# Patient Record
Sex: Female | Born: 1965 | Race: Black or African American | Hispanic: No | Marital: Single | Smoking: Never smoker
Health system: Southern US, Community
[De-identification: ages and names within clinical notes are randomized; demographics above are authoritative.]

## PROBLEM LIST (undated history)

## (undated) DIAGNOSIS — F419 Anxiety disorder, unspecified: Secondary | ICD-10-CM

## (undated) DIAGNOSIS — G709 Myoneural disorder, unspecified: Secondary | ICD-10-CM

## (undated) DIAGNOSIS — L4050 Arthropathic psoriasis, unspecified: Secondary | ICD-10-CM

## (undated) DIAGNOSIS — G5600 Carpal tunnel syndrome, unspecified upper limb: Secondary | ICD-10-CM

## (undated) DIAGNOSIS — M797 Fibromyalgia: Secondary | ICD-10-CM

## (undated) DIAGNOSIS — K589 Irritable bowel syndrome without diarrhea: Secondary | ICD-10-CM

## (undated) DIAGNOSIS — Z86718 Personal history of other venous thrombosis and embolism: Secondary | ICD-10-CM

## (undated) DIAGNOSIS — N3010 Interstitial cystitis (chronic) without hematuria: Secondary | ICD-10-CM

## (undated) DIAGNOSIS — I10 Essential (primary) hypertension: Secondary | ICD-10-CM

## (undated) DIAGNOSIS — R51 Headache: Secondary | ICD-10-CM

## (undated) DIAGNOSIS — J449 Chronic obstructive pulmonary disease, unspecified: Secondary | ICD-10-CM

## (undated) DIAGNOSIS — E0590 Thyrotoxicosis, unspecified without thyrotoxic crisis or storm: Secondary | ICD-10-CM

## (undated) DIAGNOSIS — R7303 Prediabetes: Secondary | ICD-10-CM

## (undated) DIAGNOSIS — R519 Headache, unspecified: Secondary | ICD-10-CM

## (undated) DIAGNOSIS — J189 Pneumonia, unspecified organism: Secondary | ICD-10-CM

## (undated) DIAGNOSIS — M51369 Other intervertebral disc degeneration, lumbar region without mention of lumbar back pain or lower extremity pain: Secondary | ICD-10-CM

## (undated) DIAGNOSIS — K219 Gastro-esophageal reflux disease without esophagitis: Secondary | ICD-10-CM

## (undated) DIAGNOSIS — M1990 Unspecified osteoarthritis, unspecified site: Secondary | ICD-10-CM

## (undated) DIAGNOSIS — J42 Unspecified chronic bronchitis: Secondary | ICD-10-CM

## (undated) DIAGNOSIS — G4730 Sleep apnea, unspecified: Secondary | ICD-10-CM

## (undated) DIAGNOSIS — J45909 Unspecified asthma, uncomplicated: Secondary | ICD-10-CM

## (undated) DIAGNOSIS — Z87898 Personal history of other specified conditions: Secondary | ICD-10-CM

## (undated) DIAGNOSIS — N189 Chronic kidney disease, unspecified: Secondary | ICD-10-CM

## (undated) DIAGNOSIS — E079 Disorder of thyroid, unspecified: Secondary | ICD-10-CM

## (undated) DIAGNOSIS — Z87448 Personal history of other diseases of urinary system: Secondary | ICD-10-CM

## (undated) HISTORY — PX: OTHER SURGICAL HISTORY: SHX169

## (undated) HISTORY — DX: Myoneural disorder, unspecified: G70.9

## (undated) HISTORY — DX: Disorder of thyroid, unspecified: E07.9

## (undated) HISTORY — DX: Personal history of other venous thrombosis and embolism: Z86.718

## (undated) HISTORY — DX: Unspecified chronic bronchitis: J42

## (undated) HISTORY — PX: WISDOM TOOTH EXTRACTION: SHX21

## (undated) HISTORY — DX: Other intervertebral disc degeneration, lumbar region without mention of lumbar back pain or lower extremity pain: M51.369

## (undated) HISTORY — PX: ABDOMINAL HYSTERECTOMY: SHX81

## (undated) HISTORY — DX: Unspecified osteoarthritis, unspecified site: M19.90

## (undated) HISTORY — DX: Unspecified asthma, uncomplicated: J45.909

---

## 1994-09-24 HISTORY — PX: HERNIA REPAIR: SHX51

## 1995-09-25 HISTORY — PX: OTHER SURGICAL HISTORY: SHX169

## 1998-06-08 ENCOUNTER — Ambulatory Visit (HOSPITAL_COMMUNITY): Admission: RE | Admit: 1998-06-08 | Discharge: 1998-06-08 | Payer: Self-pay | Admitting: *Deleted

## 1998-07-29 ENCOUNTER — Ambulatory Visit (HOSPITAL_COMMUNITY): Admission: RE | Admit: 1998-07-29 | Discharge: 1998-07-29 | Payer: Self-pay | Admitting: *Deleted

## 1998-08-04 ENCOUNTER — Ambulatory Visit (HOSPITAL_COMMUNITY): Admission: RE | Admit: 1998-08-04 | Discharge: 1998-08-04 | Payer: Self-pay | Admitting: *Deleted

## 1998-08-10 ENCOUNTER — Ambulatory Visit (HOSPITAL_COMMUNITY): Admission: RE | Admit: 1998-08-10 | Discharge: 1998-08-10 | Payer: Self-pay | Admitting: *Deleted

## 1998-08-26 ENCOUNTER — Ambulatory Visit (HOSPITAL_COMMUNITY): Admission: RE | Admit: 1998-08-26 | Discharge: 1998-08-26 | Payer: Self-pay | Admitting: *Deleted

## 1999-04-21 ENCOUNTER — Ambulatory Visit (HOSPITAL_COMMUNITY): Admission: RE | Admit: 1999-04-21 | Discharge: 1999-04-21 | Payer: Self-pay | Admitting: *Deleted

## 1999-06-08 ENCOUNTER — Encounter: Payer: Self-pay | Admitting: *Deleted

## 1999-06-08 ENCOUNTER — Ambulatory Visit (HOSPITAL_COMMUNITY): Admission: AD | Admit: 1999-06-08 | Discharge: 1999-06-08 | Payer: Self-pay | Admitting: *Deleted

## 1999-09-25 DIAGNOSIS — J189 Pneumonia, unspecified organism: Secondary | ICD-10-CM

## 1999-09-25 HISTORY — DX: Pneumonia, unspecified organism: J18.9

## 2000-05-20 ENCOUNTER — Encounter: Payer: Self-pay | Admitting: Emergency Medicine

## 2000-05-20 ENCOUNTER — Emergency Department (HOSPITAL_COMMUNITY): Admission: EM | Admit: 2000-05-20 | Discharge: 2000-05-20 | Payer: Self-pay | Admitting: Emergency Medicine

## 2000-06-27 ENCOUNTER — Encounter: Payer: Self-pay | Admitting: Emergency Medicine

## 2000-06-27 ENCOUNTER — Emergency Department (HOSPITAL_COMMUNITY): Admission: EM | Admit: 2000-06-27 | Discharge: 2000-06-27 | Payer: Self-pay | Admitting: Emergency Medicine

## 2000-06-28 ENCOUNTER — Emergency Department (HOSPITAL_COMMUNITY): Admission: EM | Admit: 2000-06-28 | Discharge: 2000-06-28 | Payer: Self-pay | Admitting: Emergency Medicine

## 2000-06-30 ENCOUNTER — Inpatient Hospital Stay (HOSPITAL_COMMUNITY): Admission: EM | Admit: 2000-06-30 | Discharge: 2000-07-03 | Payer: Self-pay | Admitting: Emergency Medicine

## 2000-06-30 ENCOUNTER — Encounter: Payer: Self-pay | Admitting: *Deleted

## 2000-07-17 ENCOUNTER — Ambulatory Visit (HOSPITAL_COMMUNITY): Admission: RE | Admit: 2000-07-17 | Discharge: 2000-07-17 | Payer: Self-pay | Admitting: *Deleted

## 2000-07-17 ENCOUNTER — Encounter: Payer: Self-pay | Admitting: *Deleted

## 2001-04-24 ENCOUNTER — Encounter: Admission: RE | Admit: 2001-04-24 | Discharge: 2001-04-24 | Payer: Self-pay | Admitting: *Deleted

## 2001-04-24 ENCOUNTER — Encounter: Payer: Self-pay | Admitting: *Deleted

## 2001-06-27 ENCOUNTER — Other Ambulatory Visit: Admission: RE | Admit: 2001-06-27 | Discharge: 2001-06-27 | Payer: Self-pay | Admitting: Obstetrics and Gynecology

## 2001-07-16 ENCOUNTER — Ambulatory Visit (HOSPITAL_COMMUNITY): Admission: RE | Admit: 2001-07-16 | Discharge: 2001-07-16 | Payer: Self-pay | Admitting: *Deleted

## 2001-07-16 ENCOUNTER — Encounter: Payer: Self-pay | Admitting: *Deleted

## 2001-08-19 ENCOUNTER — Encounter: Payer: Self-pay | Admitting: Emergency Medicine

## 2001-08-19 ENCOUNTER — Emergency Department (HOSPITAL_COMMUNITY): Admission: EM | Admit: 2001-08-19 | Discharge: 2001-08-19 | Payer: Self-pay | Admitting: Emergency Medicine

## 2001-09-01 ENCOUNTER — Ambulatory Visit (HOSPITAL_COMMUNITY): Admission: RE | Admit: 2001-09-01 | Discharge: 2001-09-01 | Payer: Self-pay | Admitting: Obstetrics and Gynecology

## 2001-09-01 ENCOUNTER — Encounter (INDEPENDENT_AMBULATORY_CARE_PROVIDER_SITE_OTHER): Payer: Self-pay | Admitting: Specialist

## 2002-04-02 ENCOUNTER — Ambulatory Visit (HOSPITAL_BASED_OUTPATIENT_CLINIC_OR_DEPARTMENT_OTHER): Admission: RE | Admit: 2002-04-02 | Discharge: 2002-04-02 | Payer: Self-pay | Admitting: Internal Medicine

## 2002-09-24 HISTORY — PX: ABDOMINAL HYSTERECTOMY: SHX81

## 2003-05-24 ENCOUNTER — Other Ambulatory Visit: Admission: RE | Admit: 2003-05-24 | Discharge: 2003-05-24 | Payer: Self-pay | Admitting: Obstetrics and Gynecology

## 2003-06-01 ENCOUNTER — Encounter (INDEPENDENT_AMBULATORY_CARE_PROVIDER_SITE_OTHER): Payer: Self-pay | Admitting: *Deleted

## 2003-06-01 ENCOUNTER — Observation Stay (HOSPITAL_COMMUNITY): Admission: AD | Admit: 2003-06-01 | Discharge: 2003-06-02 | Payer: Self-pay | Admitting: Obstetrics and Gynecology

## 2003-11-08 ENCOUNTER — Ambulatory Visit (HOSPITAL_COMMUNITY): Admission: RE | Admit: 2003-11-08 | Discharge: 2003-11-08 | Payer: Self-pay | Admitting: Obstetrics and Gynecology

## 2003-12-31 ENCOUNTER — Ambulatory Visit (HOSPITAL_BASED_OUTPATIENT_CLINIC_OR_DEPARTMENT_OTHER): Admission: RE | Admit: 2003-12-31 | Discharge: 2003-12-31 | Payer: Self-pay | Admitting: Urology

## 2004-07-21 ENCOUNTER — Encounter: Admission: RE | Admit: 2004-07-21 | Discharge: 2004-07-21 | Payer: Self-pay | Admitting: Family Medicine

## 2004-07-24 ENCOUNTER — Encounter: Admission: RE | Admit: 2004-07-24 | Discharge: 2004-07-24 | Payer: Self-pay | Admitting: Family Medicine

## 2004-07-31 ENCOUNTER — Ambulatory Visit: Payer: Self-pay | Admitting: Internal Medicine

## 2004-08-01 ENCOUNTER — Ambulatory Visit: Payer: Self-pay | Admitting: Internal Medicine

## 2004-10-02 ENCOUNTER — Encounter (INDEPENDENT_AMBULATORY_CARE_PROVIDER_SITE_OTHER): Payer: Self-pay | Admitting: Specialist

## 2004-10-02 ENCOUNTER — Ambulatory Visit (HOSPITAL_COMMUNITY): Admission: RE | Admit: 2004-10-02 | Discharge: 2004-10-02 | Payer: Self-pay | Admitting: Obstetrics and Gynecology

## 2004-12-05 ENCOUNTER — Encounter: Admission: RE | Admit: 2004-12-05 | Discharge: 2004-12-05 | Payer: Self-pay | Admitting: Family Medicine

## 2004-12-14 ENCOUNTER — Encounter: Admission: RE | Admit: 2004-12-14 | Discharge: 2004-12-14 | Payer: Self-pay | Admitting: Family Medicine

## 2005-02-14 ENCOUNTER — Encounter: Admission: RE | Admit: 2005-02-14 | Discharge: 2005-02-14 | Payer: Self-pay | Admitting: Family Medicine

## 2005-03-05 ENCOUNTER — Emergency Department (HOSPITAL_COMMUNITY): Admission: EM | Admit: 2005-03-05 | Discharge: 2005-03-05 | Payer: Self-pay | Admitting: Emergency Medicine

## 2006-01-23 ENCOUNTER — Encounter: Payer: Self-pay | Admitting: *Deleted

## 2007-07-18 ENCOUNTER — Ambulatory Visit: Payer: Self-pay | Admitting: *Deleted

## 2007-07-18 ENCOUNTER — Ambulatory Visit: Payer: Self-pay | Admitting: Internal Medicine

## 2007-08-04 ENCOUNTER — Ambulatory Visit: Payer: Self-pay | Admitting: Family Medicine

## 2007-11-03 ENCOUNTER — Emergency Department (HOSPITAL_COMMUNITY): Admission: EM | Admit: 2007-11-03 | Discharge: 2007-11-03 | Payer: Self-pay | Admitting: Family Medicine

## 2007-12-03 ENCOUNTER — Ambulatory Visit: Payer: Self-pay | Admitting: Internal Medicine

## 2007-12-12 ENCOUNTER — Ambulatory Visit: Payer: Self-pay | Admitting: Internal Medicine

## 2007-12-18 ENCOUNTER — Encounter: Admission: RE | Admit: 2007-12-18 | Discharge: 2008-01-08 | Payer: Self-pay | Admitting: Family Medicine

## 2008-02-23 ENCOUNTER — Ambulatory Visit: Payer: Self-pay | Admitting: Internal Medicine

## 2008-03-16 ENCOUNTER — Ambulatory Visit: Payer: Self-pay | Admitting: Family Medicine

## 2008-03-17 ENCOUNTER — Ambulatory Visit (HOSPITAL_COMMUNITY): Admission: RE | Admit: 2008-03-17 | Discharge: 2008-03-17 | Payer: Self-pay | Admitting: Family Medicine

## 2008-06-21 ENCOUNTER — Emergency Department (HOSPITAL_COMMUNITY): Admission: EM | Admit: 2008-06-21 | Discharge: 2008-06-21 | Payer: Self-pay | Admitting: Family Medicine

## 2008-06-29 ENCOUNTER — Ambulatory Visit: Payer: Self-pay | Admitting: Family Medicine

## 2008-06-29 LAB — CONVERTED CEMR LAB: Vit D, 1,25-Dihydroxy: 25 — ABNORMAL LOW (ref 30–89)

## 2008-07-02 ENCOUNTER — Ambulatory Visit: Payer: Self-pay | Admitting: Internal Medicine

## 2008-07-09 ENCOUNTER — Ambulatory Visit (HOSPITAL_COMMUNITY): Admission: RE | Admit: 2008-07-09 | Discharge: 2008-07-09 | Payer: Self-pay | Admitting: *Deleted

## 2008-07-20 ENCOUNTER — Encounter: Admission: RE | Admit: 2008-07-20 | Discharge: 2008-07-20 | Payer: Self-pay | Admitting: Family Medicine

## 2008-12-03 ENCOUNTER — Other Ambulatory Visit: Admission: RE | Admit: 2008-12-03 | Discharge: 2008-12-03 | Payer: Self-pay | Admitting: Internal Medicine

## 2008-12-03 ENCOUNTER — Encounter (INDEPENDENT_AMBULATORY_CARE_PROVIDER_SITE_OTHER): Payer: Self-pay | Admitting: Family Medicine

## 2008-12-03 ENCOUNTER — Ambulatory Visit: Payer: Self-pay | Admitting: Family Medicine

## 2008-12-06 ENCOUNTER — Ambulatory Visit: Payer: Self-pay | Admitting: Family Medicine

## 2008-12-10 ENCOUNTER — Ambulatory Visit (HOSPITAL_COMMUNITY): Admission: RE | Admit: 2008-12-10 | Discharge: 2008-12-10 | Payer: Self-pay | Admitting: Family Medicine

## 2009-01-11 ENCOUNTER — Encounter: Admission: RE | Admit: 2009-01-11 | Discharge: 2009-01-11 | Payer: Self-pay | Admitting: Family Medicine

## 2009-02-06 ENCOUNTER — Emergency Department (HOSPITAL_COMMUNITY): Admission: EM | Admit: 2009-02-06 | Discharge: 2009-02-06 | Payer: Self-pay | Admitting: Family Medicine

## 2009-02-10 ENCOUNTER — Emergency Department (HOSPITAL_COMMUNITY): Admission: EM | Admit: 2009-02-10 | Discharge: 2009-02-10 | Payer: Self-pay | Admitting: Emergency Medicine

## 2009-07-26 ENCOUNTER — Encounter: Admission: RE | Admit: 2009-07-26 | Discharge: 2009-07-26 | Payer: Self-pay | Admitting: Family Medicine

## 2009-10-18 ENCOUNTER — Encounter: Admission: RE | Admit: 2009-10-18 | Discharge: 2010-01-16 | Payer: Self-pay | Admitting: Specialist

## 2010-11-17 DIAGNOSIS — J309 Allergic rhinitis, unspecified: Secondary | ICD-10-CM | POA: Insufficient documentation

## 2011-01-11 DIAGNOSIS — N3010 Interstitial cystitis (chronic) without hematuria: Secondary | ICD-10-CM | POA: Insufficient documentation

## 2011-01-11 DIAGNOSIS — E785 Hyperlipidemia, unspecified: Secondary | ICD-10-CM | POA: Insufficient documentation

## 2011-02-09 NOTE — H&P (Signed)
[place]. [facility]  Patient:    Christine Bradley, Christine Bradley                             MRN: [id number] Adm. Date:  [id number] Attending:  Sandi Raveling                         History and Physical  HISTORY OF PRESENT ILLNESS:  This pleasant 45 year old black female was admitted into the hospital because of fever of unknown etiology.  The patient states she has been having fevers and chills over the last three to four days. She presented to the emergency room four days ago, for which they treated her with antibiotics at that time.  She subsequently went back 24 hours later at that time, and no specific changes were noticed in the regimen of treatment. The patient was taking her medicines during the interim without any improvement of her fever and subsequently contacted me today at this point. She was advised to go to the emergency room, where she was admitted into the hospital.  The patient states that she has had a cough, but it is usually a dry cough with little production of sputum that is noted.  The patient also has a history of bronchitis, for which she presently takes medication (a Ventolin inhaler).  There has been no history of any hemoptysis that was noticed nor a history of any other pulmonary problems that is noted.  The patient also has complaints of some mild abdominal back discomfort, which has occurred at this time.  She has been evaluated with ANA test looking for lupus, but the numbers appeared to be elevated but borderline normal at this time (this was done approximately two weeks ago as an outpatient).  FAMILY HISTORY:  Essentially noncontributory.  SOCIAL HISTORY:  The patient does not smoke or drink ETOH that is noted at this time.  MEDICATIONS:  Current medications are the Ventolin inhaler as well as an antibiotic that is present to this point.  PHYSICAL EXAMINATION:  GENERAL:  She is a pleasant female, appears to be in some distress  today.  VITAL SIGNS:  Her vital signs are stable with a temperature of 101.6 that is noted, blood pressure is 147/62, respiratory rate was 20.  HEENT:  Anicteric.  NECK:  Supple.  LUNGS:  The lungs appeared to be clear to auscultation and percussion, no egophony is noted at this time.  It should be noted that the patient does have a slightly hacking cough that is present.  HEART:  A regular rate and rhythm without heaves, thrills, murmurs, or gallops noted.  ABDOMEN:  Soft, minimal tenderness to palpation, no rebound or referred tenderness appreciated.  RECTAL:  Digital rectal exam was not done.  EXTREMITIES:  No cyanosis, clubbing, or edema.  GROSS IMPRESSION:  Fever, etiology unknown (existing for approximately four days on antibiotics without response).  Rule out septic process.  Rule out secondary to urosepsis.  Rule out pulmonary pneumonic process that is present.  RECOMMENDATIONS: 1. Presently I am going to admit the patient for a 23-hour observation. 2. Chest x-ray. 3. Exchange her antibiotics to Levaquin 500 mg IV stat and q.d. 4. Culture and sensitivities with a urine culture and sensitivity and sputum    for culture and sensitivity and Gram stains as noted.  Depending on these results will determine the course of therapy. DD:  06/30/00 TD:  07/01/00 Job: [id number]

## 2011-02-09 NOTE — Op Note (Signed)
[facility]  Patient:    Christine Bradley, Christine Bradley Visit Number: [id number] MRN: [id number]          Service Type: DSU Location: [facility] Attending Physician:  Leonard Schwartz Dictated by:   Janine Limbo, M.D. Admit Date:  09/01/2001   CC:         Sharyn Dross., M.D.   Operative Report  PREOPERATIVE DIAGNOSES:       1. Pelvic pain.                               2. Dyspareunia.                               3. Fibroid uterus.  POSTOPERATIVE DIAGNOSES:      1. Pelvic pain.                               2. Dyspareunia.                               3. Two peritoneal windows consistent with                                  endometriosis.  PROCEDURE:                    Open laparoscopy with laparoscopic biopsies.  SURGEON:                      Janine Limbo, M.D.  ANESTHESIA:                   General.  INDICATIONS:                  The patient is a 45 year old female, para 3-0-2-3 who presents with the above-mentioned diagnoses.  She understands the indications for her procedure and she accepts the risks of but not limited to anesthetic complications, bleeding, infection and possible damage to the surrounding organs.  She understands that no guarantees can be given concerning the total relief of her pelvic discomfort.  FINDINGS:                     The uterus was upper limits of normal in size. The fallopian tubes were normal except for defects from her prior tubal ligation.  The ovaries were normal bilaterally.  There were two peritoneal windows in the posterior cul-de-sac.  The peritoneal window in the right posterior cul-de-sac measured approximately 2 cm across and 1 cm in depth. this was located medial to the uterosacral ligament on the right.  There was peritoneal window on the left measuring 1 cm across and 1 cm in depth.  It was located approximately 0.5 cm medial to the left uterosacral ligament.  There was no other evidence of  pathology.  There was no evidence of endometriosis in the anterior cul-de-sac, along the uterosacral ligaments, or on the bowel. The appendix appeared normal, although there were a few filmy adhesions present between the bowel and the right pelvic side wall.  The liver and gallbladder appeared normal.  The upper abdomen appeared normal.  DESCRIPTION OF PROCEDURE:  The patient was taken to the operating room, where a general anesthetic was given.  The patients abdomen, perineum and vagina were prepped with multiple layers of Betadine.  Examination under anesthesia was performed.  A Foley catheter was placed in the bladder.  A Hulka tenaculum was placed inside the uterus.  The patient was sterilely draped.  The subumbilical area was injected with 6 cc of 0.5% Marcaine.  A small subumbilical incision was made and the incision was extended sharply through the subcutaneous tissue, the fascia and the anterior peritoneum.  The open approach was chosen because the patient had had a prior umbilical hernia repair.  A Hasson cannula was sutured into place using 0 Vicryl.  The laparoscope was inserted.  The pelvic structures were visualized with findings as mentioned above.  The suprapubic area was injected with 2 cc of 0.5% Marcaine.  An incision was made.  A 5 mm trocar was placed in the lower abdomen under direct visualization.  Pictures were taken of the patients pelvic anatomy.  The peritoneal windows in the posterior cul-de-sac were then injected with 30 cc of saline.  Biopsies were obtained from both peritoneal windows, effectively removing them.  The specimens were sent to pathology for evaluation.  The pelvis was again inspected.  The bowel was carefully inspected and there was no evidence of damage to the bowel from out trocars. This having been completed, the pneumoperitoneum was allowed to escape.  All instruments were removed.  The fascia at the subumbilical area was closed using  figure-of-eight sutures of 0 Vicryl.  The subcutaneous layer was closed using interrupted sutures of 0 Vicryl.  The skin was reapproximated using a subcuticular suture of 4-0 Vicryl.  The incision in the left lower quadrant was then closed using deep and superficial sutures of 4-0 Vicryl.  Sponge, needle and instrument counts were correct on two occasions.  Estimated blood loss for the procedure was 10 cc.  The patient tolerated the procedure well. She was awakened from her anesthetic and then taken to the recovery room in stable condition.  FOLLOW-UP INSTRUCTIONS:       The patient was given a prescription for Vicodin, which she will take 1-2 tablets q.4h. as needed for pain.  She will call for questions or concerns.  She will return to work before, but not later than September 08, 2001.  She was given a copy of the postoperative instruction sheet as prepared by the [facility] for patients who have undergone laparoscopy. Dictated by:   Janine Limbo, M.D. Attending Physician:  Leonard Schwartz DD:  09/01/01 TD:  09/01/01 Job: [id number]

## 2011-02-09 NOTE — H&P (Signed)
NAME:  Christine Bradley, Christine Bradley NO.:  [id number]   MEDICAL RECORD NO.:  [id number]                   PATIENT TYPE:  OBV   LOCATION:  NA                                   [facility]ILITY[facility]   PHYSICIAN:  Janine Limbo, M.D.            DATE OF BIRTH:  1966/03/10   DATE OF ADMISSION:  06/01/2003  DATE OF DISCHARGE:                                HISTORY & PHYSICAL   HISTORY OF PRESENT ILLNESS:  Christine Bradley is a 44 year old female, para 3-0-2-3,  who presents for a laparoscopy-assisted vaginal hysterectomy.  She has been  followed at the [facility]  [facility].  She has a known history of fibroids and a  known history of pelvic pain.  She had a diagnostic laparoscopy in December  2002.  Operative findings included areas that were consistent with  endometriosis.  The uterus, at the time, was upper limits normal size.   She has continued to have discomfort in spite of oral contraceptives and  pain medication.  She also complains of dyspareunia and menorrhagia.  An  ultrasound has shown a 10 x 6.3 cm uterus.  Her Pap smear was normal in  August 2004.  An endometrial biopsy showed benign endometrial elements.  The  patient is status post tubal ligation from 63.  She does have a past  history of gonorrhea, Chlamydia, trichomoniasis and the human papilloma  virus.  She has had D&C procedures associated with miscarriages.   OBSTETRICAL HISTORY:  The patient has had three term vaginal deliveries and  two miscarriages.   PAST MEDICAL HISTORY:  The patient has a history of thyroid disease, in the  past.   DRUG ALLERGIES:  PENICILLIN and the patient reports that penicillin causes  her to have a rash or irritation at her perineum (most likely yeast  infections).   SOCIAL HISTORY:  The patient denies cigarette use, alcohol use, and  recreational drug use.   REVIEW OF SYSTEMS:  Please see history of present  illness.  The patient also  complains of occasional urinary incontinence and headaches.   FAMILY HISTORY:  Noncontributory.   PHYSICAL EXAMINATION:  VITAL SIGNS:  Weight 195 pounds.  HEENT:  Within normal limits.  CHEST:  Clear.  HEART:  Regular, rate and rhythm.  BREASTS:  Without masses.  ABDOMEN:  Nontender.  EXTREMITIES:  Within normal limits.  NEUROLOGICAL:  Grossly normal.  PELVIC:  External genitalia normal.  Vagina normal.  Cervix nontender.  Uterus 12 weeks size, irregular, and nontender.  Adnexa, no masses are  appreciated.  No tenderness noted.  Rectovaginal examination confirms.   ASSESSMENT:  1. Fibroid uterus.  2. Menorrhagia.  3. Dysmenorrhea.  4. Dyspareunia.   PLAN:  The patient will undergo a laparoscopy-assisted vaginal hysterectomy.  She understands the indications for her procedure and she accepts the risk  of, but not limited to, anesthetic complications, bleeding, infections and  possible damage to the surrounding organs.                                               Janine Limbo, M.D.    AVS/MEDQ  D:  05/26/2003  T:  05/26/2003  Job:  [id number]   cc:   Sharyn Dross., M.D.  [address]  [address]  [place]  [place] [address]  Fax: [phone]

## 2011-02-09 NOTE — Discharge Summary (Signed)
NAME:  Christine Bradley, KEACH NO.:  [id number]   MEDICAL RECORD NO.:  [id number]                   PATIENT TYPE:  OBV   LOCATION:  9140                                 [facility]ILITY[facility]   PHYSICIAN:  Janine Limbo, M.D.            DATE OF BIRTH:  08-03-1966   DATE OF ADMISSION:  06/01/2003  DATE OF DISCHARGE:  06/02/2003                                 DISCHARGE SUMMARY   DISCHARGE DIAGNOSES:  1. Fibroid uterus.  2. Menorrhagia.  3. Dysmenorrhea.  4. Dyspareunia.  5. Anemia.   OPERATION:  On the date of admission, the patient underwent a  laparoscopically assisted vaginal hysterectomy tolerating the procedure  well. The patient was found to have a fibroid uterus weighing 212 grams  along with normal appearing tubes and ovaries.   HISTORY OF PRESENT ILLNESS:  Ms. Christine Bradley is a 45 year old female para 3-0-2-3  who presents for a laparoscopically assisted vaginal hysterectomy because of  dyspareunia, menorrhagia and dysmenorrhea. Please see patient's dictated  history and physical examination for details.   PHYSICAL EXAMINATION:  VITAL SIGNS: Weight 195 pounds.  GENERAL: Examination within normal limits.  PELVIC: External genitalia is normal. Vagina is normal. Cervix is non-  tender. Uterus 12 weeks size, irregular and nontender. Adnexa no masses were  appreciated. No tenderness noted. Rectovaginal examination confirmed.   HOSPITAL COURSE:  On the date of admission, the patient underwent  aforementioned procedure, tolerating it well. Postoperative course was  unremarkable with the patient resuming bowel and bladder function by  postoperative day 1 and therefore, deemed ready for discharge home.   LABORATORY DATA:  Discharge hemoglobin 11.3 (preoperative hemoglobin 11.5.)   DISCHARGE MEDICATIONS:  1. Darvocet N-100 1 tablets every 4-6 hours as needed for pain.  2. Advil 200 mg tablet, 3 tablets every 6 hours as needed for pain.  3. Phenergan 25 mg 1  tablet every 6 hours as needed for nausea.  4. Iron 325 mg 1 tablet twice daily for 6 weeks.   FOLLOW UP:  The patient is to call [facility] and  Gynecologic Services for a 6 week postoperative visit with Dr. Leonard Schwartz.   DISCHARGE INSTRUCTIONS:  The patient is advised to avoid driving for 2  weeks, heavy lifting for 4 weeks, intercourse for 6 weeks. To call for any  temperature greater than 101 degrees Farenheit orally and for any severe  problem.   DIET:  Without restriction.   PATHOLOGY:  Final pathology, uterus excision: Benign secretory phase  endometrium without hyperplasia or evidence of malignancy. Benign leiomyoma.  Serosa with no pathologic abnormalities. Benign cervix. No dysplasia  identified.     Christine Bradley.                    Janine Limbo, M.D.    EJP/MEDQ  D:  06/23/2003  T:  06/23/2003  Job:  [id number]

## 2011-02-09 NOTE — Op Note (Signed)
NAME:  Christine, Bradley NO.:  [id number]   MEDICAL RECORD NO.:  [id number]          PATIENT TYPE:  AMB   LOCATION:  SDC                           [facility]ILITY:  [facility]   PHYSICIAN:  Janine Limbo, M.D.DATE OF BIRTH:  1966/08/08   DATE OF PROCEDURE:  10/02/2004  DATE OF DISCHARGE:                                 OPERATIVE REPORT   PREOPERATIVE DIAGNOSES:  1.  Pelvic pain.  2.  Right adnexal mass.   POSTOPERATIVE DIAGNOSES:  1.  Pelvic pain.  2.  Dense pelvic adhesions.   PROCEDURE:  1.  Diagnostic laparoscopy.  2.  Laparoscopic lysis of adhesions.  3.  Laparoscopic left oophorectomy.  4.  Laparoscopic right salpingo-oophorectomy.   SURGEON:  Janine Limbo, M.D.   ANESTHETIC:  general.   DISPOSITION:  Ms. Christine Bradley is a 45 year old female, para 3-0-2-3, who presents  for diagnostic laparoscopy.  The patient has had a diagnosis of  endometriosis in the past.  She has also had a laparoscopically-assisted  vaginal hysterectomy because of fibroids.  The patient had a CT scan  performed that showed a 3.8 x 4.5 persistent complex right adnexal mass.  The the patient has a history of interstitial cystitis, but this is  supposedly stable.  She has had increasing pelvic pain that she describes as  a different pain from her interstitial cystitis.  The the patient also  complained of abdominal bloating and an increase in her weight.  The patient understands the options for management.  She elected to proceed  with laparoscopy.  The  patient had stated that he have her ovaries are completely normal, then she  wishes not to have her ovaries removed.  On the other hand, if there is any  pathology found or if adhesions were found, the patient wants to have a  bilateral oophorectomy.  The patient understands that hormone replacement  therapy will be recommended if she has both ovaries removed.  The patient  understands the indications for her surgical procedure and she  accepts the  risk of, but not limited to, anesthetic complications, bleeding, infections,  and possible damage to surrounding organs.   FINDINGS:  The uterus was surgically absent.  There were dense adhesions in  the pelvis between the colon, the left adnexa, the right adnexa, and the  posterior cul-de-sac.  No adnexal masses were appreciated.  I believe that  the suspected mass on CT scan, in fact, was a result of adhesions and  loculated fluid in the pelvis.   PROCEDURE:  The patient was taken to the operating room, where a general  anesthetic was given.  The patient's abdomen was prepped with multiple  layers of Betadine, as was the perineum and vagina.  A Foley catheter was  placed in the bladder.  Examination under anesthesia was performed.  The  patient was then sterilely draped.  The subumbilical area was injected with  6 mL of half percent Marcaine with epinephrine.  An incision was made in the  subumbilical area, removing the scar tissue from this area.  The  incision was extended sharply through the subcutaneous tissue, the  fascia, and the anterior peritoneum.  Care was taken not to damage any  structures.  The Hasson cannula was sutured into place using 0 Vicryl.  The  laparoscope was inserted and the pelvis was visualized with findings as  mentioned above.  Two suprapubic areas were injected with 2 mL each of 0.5%  Marcaine with epinephrine.  Two 5 mm trocars were placed in the lower  abdomen under direct visualization.  Pictures were then taken of the  patient's pelvic anatomy.  We began our procedure by lysing the adhesions  using a combination of sharp dissection and blunt dissection.  Bipolar  cautery was used for hemostasis on all thickened adhesions.  Care was taken  not to damage any of the vital underlying structures.  The ureters were  inspected bilaterally and were felt to be away from our surgical areas.  Because of the dense adhesions present in the pelvis, the  decision was made  to proceed with a bilateral salpingo-oophorectomy as requested by the  patient.  The right round ligament was identified and opened.  The adhesions  encasing the right fallopian tube and ovary were then lysed to the point  that the right infundibulopelvic ligament could be skeletonized.  Again,  care was taken not to damage the right ureter. Once the right  infundibulopelvic ligament was adequately exposed, two Endoloop sutures were  placed around the right infundibulopelvic ligament and tied securely.  The  right infundibulopelvic ligament was then cut and the specimen was freed  from  its attachments.  The specimen was placed in the posterior cul-de-sac.  Our attention was then turned to the left adnexa.  The left fallopian tube  was densely adhered to the left pelvic sidewall.  We were able to  skeletonize the left ovary by opening the left round ligament and  skeletonizing the tissue using sharp and blunt dissection.  Because the left  fallopian tube was so densely adhered, we did not feel that it was  appropriate, or in the patient's best interest, to try to remove this.  We  were concerned about its proximity to the left ureter.  As mentioned  previously, we were able to skeletonize the left ovary, however.  The left  infundibulopelvic ligament was then skeletonized as well. Two 0 Vicryl  Endoloop sutures were placed around the left ovary and left  infundibulopelvic ligament and tied securely.  The left ovary was transected  from its attachments.  We were then able to remove both the left ovary and  the right fallopian tube and ovary through the umbilical port.  The pelvis  was vigorously irrigated.  Hemostasis was adequate.  The pelvic structures  and abdominal structures were carefully inspected, and there was no evidence  of trocar damage.  At this point we felt that our procedure was complete. The suprapubic trocars were removed under direct visualization.   The  umbilical trocar was removed under direct visualization.  The  pneumoperitoneum was allowed to completely escape.  The subumbilical  incision was closed using figure-of-eight sutures of 0 Vicryl.  The  subcutaneous layer was then closed with 0 Vicryl.  The skin was  reapproximated using a subcuticular suture of 4-0 Vicryl.  The two  suprapubic incisions were closed using 4-0 Vicryl. Sponge, needle, and  instrument counts were correct on two occasions.  Estimated blood loss for  the procedure was 15 cc.  The patient tolerated her procedure  well.  The  patient was awakened from her anesthetic without difficulty and taken to the  recovery room in stable condition.  She tolerated her procedure well.   FOLLOW-UP INSTRUCTIONS:  The patient was given a prescription for Darvocet,  and she will take one or two tablets every four hours as needed for pain.  She will return to see Dr. Stefano Gaul in two to three weeks for follow-up  examination.  She will have family medical  leave forms from her job  completed so that she can be out of work for  one week.  If she needs additional time, then that will be created.  The the  patient was given a copy of the postoperative instruction sheet as prepared  by the [facility] for patients who have undergone  laparoscopy.     [place]   AVS/MEDQ  D:  10/02/2004  T:  10/02/2004  Job:  [id number]   cc:   Otilio Connors. Gerri Spore, M.D.  [address]  [place]  [place] [address]  Fax: [phone]   Lindaann Slough, M.D.  [address]  [place]  [place] [address]  Fax: [phone]

## 2011-02-09 NOTE — H&P (Signed)
[facility]  Patient:    Christine Bradley, Christine Bradley Visit Number: [id number] MRN: [id number]          Service Type: DSU Location: [facility] Attending Physician:  Leonard Schwartz Dictated by:   Janine Limbo, M.D. Admit Date:  09/01/2001                           History and Physical  HISTORY OF PRESENT ILLNESS:   Christine Bradley is a 45 year old female, para 3-0-2-3, who presents for diagnostic laparoscopy because of pelvic pain and dyspareunia.  She has fibroids on examination.  The patient reports that she does have a past history of gonorrhea, Chlamydia, human papilloma virus and trichomoniasis.  She had a tubal ligation in 1996.  She has had D&Cs associated with miscarriages.  OBSTETRICAL HISTORY:          The patient has had three term vaginal deliveries and two miscarriages.  PAST MEDICAL HISTORY:         The patient reports that she has thyroid disease.  DRUG ALLERGIES:               PENICILLIN.  SOCIAL HISTORY:               The patient is married.  She denies cigarette use, alcohol use and recreational drug use.  REVIEW OF SYSTEMS:            The patient complains of urinary incontinence and headaches.  FAMILY HISTORY:               Noncontributory according to the patient.  PHYSICAL EXAMINATION:  VITAL SIGNS:                  Weight is 186 pounds.  HEENT:                        Within normal limits.  CHEST:                        Clear.  HEART:                        Regular rate and rhythm.  BREASTS:                      Without masses.  ABDOMEN:                      Nontender, no masses are appreciated.  BACK:                         No CVA tenderness.  EXTREMITIES:                  No clubbing, cyanosis or edema.  NEUROLOGIC EXAM:              Grossly normal.  PELVIC EXAM:                  External genitalia is normal.  The vagina is normal.  Cervix is nontender.  The uterus is 10- to 12-week size and firm. The uterus is  irregular.  Adnexa: No masses appreciated.  RECTOVAGINAL EXAM:            Confirms.  ASSESSMENT:  1. Pelvic pain.                               2. Dyspareunia.                               3. Fibroid uterus.  PLAN:                         The patient will undergo a diagnostic laparoscopy.  She understands the indications for her procedure and she accepts the risks of, but not limited to, anesthetic complications, bleeding, infection and the possible damage to the surrounding organs. Dictated by:   Janine Limbo, M.D. Attending Physician:  Leonard Schwartz DD:  08/31/01 TD:  09/01/01 Job: [id number]

## 2011-02-09 NOTE — Op Note (Signed)
NAME:  Christine Bradley, Christine Bradley NO.:  [id number]   MEDICAL RECORD NO.:  [id number]                   PATIENT TYPE:  OBV   LOCATION:  9140                                 FACILITY:  [facility]   PHYSICIAN:  Janine Limbo, M.D.            DATE OF BIRTH:  07/18/1966   DATE OF PROCEDURE:  06/01/2003  DATE OF DISCHARGE:                                 OPERATIVE REPORT   PREOPERATIVE DIAGNOSES:  1. Twelve-week size fibroid uterus.  2. Menorrhagia.  3. Dysmenorrhea.  4. Dyspareunia.  5. Anemia.   POSTOPERATIVE DIAGNOSES:  1. Twelve-week size fibroid uterus.  2. Menorrhagia.  3. Dysmenorrhea.  4. Dyspareunia.  5. Anemia.   PROCEDURE:  Laparoscopy-assisted vaginal hysterectomy.   SURGEON:  Janine Limbo, M.D.   FIRST ASSISTANT:  Osborn Coho, M.D.   ANESTHESIA:  General.   DISPOSITION:  Ms. Christine Bradley is a 45 year old female, para 3-0-2-3, who presents  with the above-mentioned diagnosis for laparoscopy-assisted vaginal  hysterectomy.  She understands the indications for her procedure and she  accepts the risks of, but not limited to, anesthetic complications,  bleeding, infections, and possible damage to the surrounding organs.   FINDINGS:  A 212 g fibroid uterus was removed.  The fallopian tubes and the  ovaries appeared normal.   DESCRIPTION OF PROCEDURE:  The patient was taken to the operating room,  where a general anesthetic was given.  The patient's abdomen, perineum, and  vagina were prepped with multiple layers of Betadine.  Examination under  anesthesia was performed.  A Foley catheter was placed in the bladder.  A  Hulka tenaculum was placed inside the uterus.  The patient was then  sterilely draped.  The subumbilical area was injected with 0.5% Marcaine  with epinephrine.  An incision was made and the incision was carried sharply  through the subcutaneous tissue, the fascia, and the anterior peritoneum.  The Hasson cannula was sutured into  place using 0 Vicryl.  A  pneumoperitoneum was then obtained.  The laparoscope was inserted and the  pelvic structures were visualized.  The anterior cul-de-sac and the  posterior cul-de-sac were noted to be free.  The adnexa were noted to be  free.  There was no evidence of damage to the bowel.  We felt that we were  able to safely proceed with the vaginal approach for the patient's surgery.  The pneumoperitoneum was allowed to escape and the laparoscope was removed.  The patient was placed in a more lithotomy position.  The cervix was  injected with a diluted solution of Pitressin and saline.  A circumferential  incision was made around the cervix and the mucosa of the vagina was  advanced both anteriorly and posteriorly.  The posterior cul-de-sac was  sharply entered.  Alternating from left to right the uterosacral ligaments,  paracervical tissues, parametrial tissues, and the uterine arteries were  clamped,  cut, sutured, and tied securely.  The uterus was then inverted  through the posterior colpotomy.  The upper pedicles were secured using  Heaney clamps and the uterus was transected from the pelvis.  The upper  pedicles were secured using free ties and then suture ligatures.  Hemostasis  was achieved using figure-of-eight sutures.  The sutures attached to the  uterosacral ligaments were then brought out through the vaginal angles and  tied securely.  A McCall culdoplasty suture was placed in the posterior cul-  de-sac incorporating the uterosacral ligaments bilaterally and the posterior  peritoneum.  A final check was made for hemostasis and hemostasis was felt  to be adequate.  The vaginal cuff was then closed using figure-of-eight  sutures incorporating the anterior vagina, the anterior peritoneum, the  posterior peritoneum, and then the posterior vagina.  The McCall culdoplasty  suture was tied securely and the vaginal apex was noted to elevate into the  midpelvis.  Vicryl 0 was  the suture material used throughout this portion of  the procedure.  Sponge, needle, and instrument counts were correct on two  occasions.  The estimated blood loss was 125 mL.  The patient was noted to  have 325 mL of clear urine output.  The surgeon then changed his gown and  gloves.  The pneumoperitoneum was reinflated.  The laparoscope was inserted  through the Hasson cannula and the pelvis was inspected.  There was a small  area of bleeding on the right pelvic sidewall.  The suprapubic area was  injected with 0.5% Marcaine and a 5 mm trocar was placed in the lower  abdomen.  Hemostasis was achieved using the bipolar cautery.  Care was taken  not to damage any of the vital underlying structures.  The pelvis was then  irrigated.  Hemostasis was felt to be adequate.  A final inspection was made  of the bowel, and there appeared to be no evidence of damage to the bowel.  The pneumoperitoneum was allowed to escape.  All instruments were removed.  The subumbilical incision was closed in layers using figure-of-eight sutures  of 0 Vicryl through the fascia.  The skin was then reapproximated using a  subcuticular suture of 4-0 Vicryl.  The suprapubic incision was closed using  4-0 Vicryl.  The patient was awakened from her anesthetic and then taken to  the recovery room in stable condition.  The patient tolerated her procedure  well.                                               Janine Limbo, M.D.    AVS/MEDQ  D:  06/01/2003  T:  06/02/2003  Job:  [id number]

## 2011-02-09 NOTE — Discharge Summary (Signed)
. [facility]  Patient:    Christine Bradley, Christine Bradley                             MRN: [id number] Adm. Date:  [id number] Disc. Date: [id number] Attending:  Sharyn Dross                           Discharge Summary  ADMISSION DIAGNOSIS:  Fever.  DISCHARGE DIAGNOSIS:  Right upper lobe pneumonia.  CONDITION ON DISCHARGE:  Stable and improved.  DISCHARGE MEDICATIONS: 1. Levaquin 500 mg q.d. x 7 days. 2. Coated clear DH 5 cc q.8-12h. p.r.n. cough. 3. Vicodin one tablet q.6h. p.r.n. pain.  FOLLOWUP:  To follow up with me in one week for re-evaluation and a repeat chest x-ray.  CONSULTATIONS:  None.  PROCEDURES:  None.  HOSPITAL COURSE:  The patient was admitted into the hospital after having four days of intermittent fevers that were present.  She presented to the emergency room on two of those occasions, and was seen and treated by EDP.  They subsequently had given her an antibiotic which was not responding at that time.  The initial x-rays showed some faint signs that were present, but the patients symptoms never improved.  After the second visit the patient waited 48 hours with still no improvement and contacted me.  She came to the emergency room where a repeat x-ray was done, and she was admitted into the hospital with temperature of 101.6.  The x-ray showed evidence of a right upper lobe pneumonia that was present at this time.  She was started with Levaquin medication.  Her hospital course otherwise was uneventful.  Her fever defervesced in the first 24 hours and remained normal from that time.  Although the improvement on the x-ray is slowly progressing at this time, the patient still has some discomforts, but is much improved then she was previously.  She still has some evidence of coughing that was present.  Sputum for culture and sensitivity were obtained, but the results are not presently available at this time.  The patient will be discharged to  home today to be continued on her antibiotics for the next 7 days.  She is to follow up with me in one week with a repeat chest x-ray during that week.  She is to continue the coughing medicine and the analgesic medicine as deemed necessary for her to take.  FINAL DIAGNOSES:  As above.  CLINICAL CONDITION:  Stable and improved. DD:  07/03/00 TD:  07/05/00 Job: [id number]

## 2011-02-09 NOTE — Op Note (Signed)
NAME:  [name], Christine Bradley                              ACCOUNT NO.:  [id number]   MEDICAL RECORD NO.:  [id number]                   PATIENT TYPE:  AMB   LOCATION:  [facility]                                 FACILITY:  [facility]   PHYSICIAN:  Lindaann Slough, M.D.               DATE OF BIRTH:  [birth year]   DATE OF PROCEDURE:  12/31/2003  DATE OF DISCHARGE:                                 OPERATIVE REPORT   PREOPERATIVE DIAGNOSIS:  Interstitial cystitis.   POSTOPERATIVE DIAGNOSIS:  Interstitial cystitis.   PROCEDURES DONE:  1. Cystoscopy.  2. Urethral dilation.  3. Hydraulic bladder distention.   SURGEON:  Lindaann Slough, M.D.   ANESTHESIA:  General.   INDICATIONS:  The patient is a 45 year old female who had been complaining  of pelvic pain for about 10 years.  A potassium test is positive.  The  patient is scheduled today for cystoscopy and hydraulic bladder distention.   Under general anesthesia, the patient was prepped and draped and placed in  the dorsal lithotomy position.  A #22 Wappler cystoscope was inserted in the  bladder.  The bladder mucosa is reddened.  There is no stone or tumor in the  bladder.  There is evidence of glomerulations in the bladder.  The bladder  was then distended for about 10 minutes, and the bladder capacity is about  400 mL.  The cystoscope was then removed.  The urethra was then dilated up  to #32 [place] and 15 mL of 0.5% Marcaine plus 400 mg of Pyridium were  instilled in the bladder.   The patient tolerated the procedure well and left the OR in satisfactory  condition to postanesthesia care unit.                                               Lindaann Slough, M.D.    MN/MEDQ  D:  12/31/2003  T:  12/31/2003  Job:  [id number]   cc:   Janine Limbo, M.D.  [address], [address]  [place]  [place] [address]  Fax: [phone]

## 2012-03-17 ENCOUNTER — Other Ambulatory Visit: Payer: Self-pay | Admitting: Nurse Practitioner

## 2012-03-17 ENCOUNTER — Other Ambulatory Visit: Payer: Self-pay | Admitting: Family Medicine

## 2012-03-17 DIAGNOSIS — D249 Benign neoplasm of unspecified breast: Secondary | ICD-10-CM

## 2012-03-21 DIAGNOSIS — F411 Generalized anxiety disorder: Secondary | ICD-10-CM | POA: Insufficient documentation

## 2012-03-21 DIAGNOSIS — J45909 Unspecified asthma, uncomplicated: Secondary | ICD-10-CM | POA: Insufficient documentation

## 2012-03-21 DIAGNOSIS — M797 Fibromyalgia: Secondary | ICD-10-CM | POA: Insufficient documentation

## 2012-03-25 ENCOUNTER — Ambulatory Visit
Admission: RE | Admit: 2012-03-25 | Discharge: 2012-03-25 | Disposition: A | Discharge status: Home / Self Care | Payer: Self-pay | Source: Ambulatory Visit | Attending: Nurse Practitioner | Admitting: Nurse Practitioner

## 2012-03-25 ENCOUNTER — Other Ambulatory Visit: Payer: Self-pay | Admitting: Family Medicine

## 2012-03-25 DIAGNOSIS — D249 Benign neoplasm of unspecified breast: Secondary | ICD-10-CM

## 2012-04-18 ENCOUNTER — Other Ambulatory Visit: Payer: Self-pay | Admitting: Family Medicine

## 2012-04-18 DIAGNOSIS — Z780 Asymptomatic menopausal state: Secondary | ICD-10-CM

## 2012-04-23 ENCOUNTER — Ambulatory Visit
Admission: RE | Admit: 2012-04-23 | Discharge: 2012-04-23 | Disposition: A | Discharge status: Home / Self Care | Payer: Medicare Other | Source: Ambulatory Visit | Attending: Family Medicine | Admitting: Family Medicine

## 2012-04-23 DIAGNOSIS — Z780 Asymptomatic menopausal state: Secondary | ICD-10-CM

## 2012-05-09 DIAGNOSIS — M8580 Other specified disorders of bone density and structure, unspecified site: Secondary | ICD-10-CM | POA: Insufficient documentation

## 2012-07-04 ENCOUNTER — Encounter: Payer: Self-pay | Admitting: Physical Medicine and Rehabilitation

## 2012-07-23 ENCOUNTER — Encounter: Payer: Medicare Other | Admitting: Physical Medicine and Rehabilitation

## 2012-08-04 ENCOUNTER — Encounter: Payer: Self-pay | Admitting: Physical Medicine and Rehabilitation

## 2012-08-04 ENCOUNTER — Encounter
Payer: Medicare Other | Attending: Physical Medicine and Rehabilitation | Admitting: Physical Medicine and Rehabilitation

## 2012-08-04 VITALS — BP 126/56 | HR 80 | Resp 14 | Ht 64.5 in | Wt 200.2 lb

## 2012-08-04 DIAGNOSIS — M47812 Spondylosis without myelopathy or radiculopathy, cervical region: Secondary | ICD-10-CM

## 2012-08-04 DIAGNOSIS — L659 Nonscarring hair loss, unspecified: Secondary | ICD-10-CM

## 2012-08-04 DIAGNOSIS — M25529 Pain in unspecified elbow: Secondary | ICD-10-CM | POA: Insufficient documentation

## 2012-08-04 DIAGNOSIS — M2550 Pain in unspecified joint: Secondary | ICD-10-CM

## 2012-08-04 DIAGNOSIS — M25569 Pain in unspecified knee: Secondary | ICD-10-CM | POA: Insufficient documentation

## 2012-08-04 DIAGNOSIS — M797 Fibromyalgia: Secondary | ICD-10-CM

## 2012-08-04 DIAGNOSIS — M79609 Pain in unspecified limb: Secondary | ICD-10-CM | POA: Insufficient documentation

## 2012-08-04 DIAGNOSIS — IMO0001 Reserved for inherently not codable concepts without codable children: Secondary | ICD-10-CM | POA: Insufficient documentation

## 2012-08-04 NOTE — Patient Instructions (Addendum)
I would like to order some blood work on you today and reviewed and will review with you at your next visit. I will screening for some common rheumatology problems.  I am recommending he see a physical therapist to help you with pacing her activities reviewing proper body mechanics and ergonomics. They will also help give you some tips how to manage pain and your neck and arm.  I would like you to reduce the amount of computer time each day to not more than one hour. Split this up into 20 minute blocks.  Please consider looking into the [employer] fibromyalgia pool program.  Will see you back in one month
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
## 2012-08-04 NOTE — Addendum Note (Signed)
Addended by: Doreene Eland on: 08/04/2012 01:38 PM   Modules accepted: Orders

## 2012-08-07 ENCOUNTER — Ambulatory Visit: Payer: Medicare Other | Attending: Physical Medicine and Rehabilitation

## 2012-08-07 DIAGNOSIS — IMO0001 Reserved for inherently not codable concepts without codable children: Secondary | ICD-10-CM | POA: Insufficient documentation

## 2012-08-07 DIAGNOSIS — M6281 Muscle weakness (generalized): Secondary | ICD-10-CM | POA: Insufficient documentation

## 2012-08-07 DIAGNOSIS — M2550 Pain in unspecified joint: Secondary | ICD-10-CM | POA: Insufficient documentation

## 2012-08-12 ENCOUNTER — Encounter: Payer: Medicare Other | Admitting: Rehabilitation

## 2012-08-14 ENCOUNTER — Encounter: Payer: Medicare Other | Admitting: Rehabilitation

## 2012-08-19 ENCOUNTER — Encounter: Payer: Medicare Other | Admitting: Rehabilitation

## 2012-09-03 ENCOUNTER — Encounter: Payer: Medicare Other | Admitting: Physical Medicine and Rehabilitation

## 2013-05-14 ENCOUNTER — Encounter: Payer: Self-pay | Admitting: Internal Medicine

## 2013-08-25 ENCOUNTER — Ambulatory Visit (INDEPENDENT_AMBULATORY_CARE_PROVIDER_SITE_OTHER): Payer: Medicare Other | Admitting: General Surgery

## 2013-09-22 ENCOUNTER — Other Ambulatory Visit: Payer: Self-pay | Admitting: Family Medicine

## 2013-09-22 DIAGNOSIS — Z1231 Encounter for screening mammogram for malignant neoplasm of breast: Secondary | ICD-10-CM

## 2013-10-07 ENCOUNTER — Ambulatory Visit (INDEPENDENT_AMBULATORY_CARE_PROVIDER_SITE_OTHER): Payer: Medicare Other | Admitting: General Surgery

## 2013-10-13 ENCOUNTER — Ambulatory Visit: Payer: Medicare Other

## 2013-10-13 ENCOUNTER — Ambulatory Visit
Admission: RE | Admit: 2013-10-13 | Discharge: 2013-10-13 | Disposition: A | Discharge status: Home / Self Care | Payer: Medicare Other | Source: Ambulatory Visit | Attending: Family Medicine | Admitting: Family Medicine

## 2013-10-13 DIAGNOSIS — Z1231 Encounter for screening mammogram for malignant neoplasm of breast: Secondary | ICD-10-CM

## 2013-12-09 ENCOUNTER — Ambulatory Visit (INDEPENDENT_AMBULATORY_CARE_PROVIDER_SITE_OTHER): Payer: Medicare Other | Admitting: General Surgery

## 2013-12-31 ENCOUNTER — Ambulatory Visit (INDEPENDENT_AMBULATORY_CARE_PROVIDER_SITE_OTHER): Payer: Medicare Other | Admitting: General Surgery

## 2014-01-19 ENCOUNTER — Ambulatory Visit (INDEPENDENT_AMBULATORY_CARE_PROVIDER_SITE_OTHER): Payer: Medicare Other | Admitting: General Surgery

## 2014-02-02 ENCOUNTER — Ambulatory Visit (INDEPENDENT_AMBULATORY_CARE_PROVIDER_SITE_OTHER): Payer: Medicare Other | Admitting: General Surgery

## 2014-02-26 ENCOUNTER — Ambulatory Visit (INDEPENDENT_AMBULATORY_CARE_PROVIDER_SITE_OTHER): Payer: Medicare Other | Admitting: General Surgery

## 2014-03-09 ENCOUNTER — Ambulatory Visit (INDEPENDENT_AMBULATORY_CARE_PROVIDER_SITE_OTHER): Payer: Medicare Other | Admitting: General Surgery

## 2014-04-05 ENCOUNTER — Encounter (HOSPITAL_COMMUNITY): Payer: Self-pay | Admitting: Emergency Medicine

## 2014-04-05 ENCOUNTER — Emergency Department (HOSPITAL_COMMUNITY): Payer: Medicare HMO

## 2014-04-05 ENCOUNTER — Emergency Department (HOSPITAL_COMMUNITY)
Admission: EM | Admit: 2014-04-05 | Discharge: 2014-04-05 | Disposition: A | Discharge status: Home / Self Care | Payer: Medicare HMO | Attending: Emergency Medicine | Admitting: Emergency Medicine

## 2014-04-05 DIAGNOSIS — IMO0002 Reserved for concepts with insufficient information to code with codable children: Secondary | ICD-10-CM | POA: Insufficient documentation

## 2014-04-05 DIAGNOSIS — Z79899 Other long term (current) drug therapy: Secondary | ICD-10-CM | POA: Insufficient documentation

## 2014-04-05 DIAGNOSIS — M129 Arthropathy, unspecified: Secondary | ICD-10-CM | POA: Insufficient documentation

## 2014-04-05 DIAGNOSIS — Z880 Allergy status to penicillin: Secondary | ICD-10-CM | POA: Insufficient documentation

## 2014-04-05 DIAGNOSIS — G608 Other hereditary and idiopathic neuropathies: Secondary | ICD-10-CM | POA: Insufficient documentation

## 2014-04-05 DIAGNOSIS — R0789 Other chest pain: Secondary | ICD-10-CM

## 2014-04-05 DIAGNOSIS — J45909 Unspecified asthma, uncomplicated: Secondary | ICD-10-CM | POA: Insufficient documentation

## 2014-04-05 DIAGNOSIS — Z862 Personal history of diseases of the blood and blood-forming organs and certain disorders involving the immune mechanism: Secondary | ICD-10-CM | POA: Insufficient documentation

## 2014-04-05 DIAGNOSIS — Z8639 Personal history of other endocrine, nutritional and metabolic disease: Secondary | ICD-10-CM | POA: Insufficient documentation

## 2014-04-05 DIAGNOSIS — R1084 Generalized abdominal pain: Secondary | ICD-10-CM | POA: Insufficient documentation

## 2014-04-05 DIAGNOSIS — R071 Chest pain on breathing: Secondary | ICD-10-CM | POA: Insufficient documentation

## 2014-04-05 LAB — HEPATIC FUNCTION PANEL
ALT: 16 U/L (ref 0–35)
AST: 30 U/L (ref 0–37)
Albumin: 3.7 g/dL (ref 3.5–5.2)
Alkaline Phosphatase: 74 U/L (ref 39–117)
Bilirubin, Direct: <0.2 mg/dL (ref 0.0–0.3)
Total Bilirubin: 0.4 mg/dL (ref 0.3–1.2)
Total Protein: 8.3 g/dL (ref 6.0–8.3)

## 2014-04-05 LAB — I-STAT TROPONIN, ED: Troponin i, poc: 0.01 ng/mL (ref 0.00–0.08)

## 2014-04-05 LAB — BASIC METABOLIC PANEL
Anion gap: 13 (ref 5–15)
BUN: 12 mg/dL (ref 6–23)
CO2: 27 mEq/L (ref 19–32)
Calcium: 9.5 mg/dL (ref 8.4–10.5)
Chloride: 101 mEq/L (ref 96–112)
Creatinine, Ser: 0.88 mg/dL (ref 0.50–1.10)
GFR calc Af Amer: 88 mL/min — ABNORMAL LOW (ref >90)
GFR calc non Af Amer: 76 mL/min — ABNORMAL LOW (ref >90)
Glucose, Bld: 102 mg/dL — ABNORMAL HIGH (ref 70–99)
Potassium: 4.3 mEq/L (ref 3.7–5.3)
Sodium: 141 mEq/L (ref 137–147)

## 2014-04-05 LAB — CBC
HCT: 38.7 % (ref 36.0–46.0)
Hemoglobin: 12.2 g/dL (ref 12.0–15.0)
MCH: 22.4 pg — ABNORMAL LOW (ref 26.0–34.0)
MCHC: 31.5 g/dL (ref 30.0–36.0)
MCV: 71 fL — ABNORMAL LOW (ref 78.0–100.0)
Platelets: 250 10*3/uL (ref 150–400)
RBC: 5.45 MIL/uL — ABNORMAL HIGH (ref 3.87–5.11)
RDW: 15.5 % (ref 11.5–15.5)
WBC: 5.6 10*3/uL (ref 4.0–10.5)

## 2014-04-05 LAB — LIPASE, BLOOD: Lipase: 24 U/L (ref 11–59)

## 2014-04-05 MED ORDER — ONDANSETRON HCL 4 MG/2ML IJ SOLN
4.0000 mg | Freq: Once | INTRAMUSCULAR | Status: AC
  Administered 2014-04-05: 4 mg via INTRAVENOUS
  Filled 2014-04-05: qty 2

## 2014-04-05 MED ORDER — MORPHINE SULFATE 4 MG/ML IJ SOLN
6.0000 mg | Freq: Once | INTRAMUSCULAR | Status: AC
  Administered 2014-04-05: 6 mg via INTRAVENOUS
  Filled 2014-04-05: qty 2

## 2014-04-05 MED ORDER — SODIUM CHLORIDE 0.9 % IV BOLUS (SEPSIS)
1000.0000 mL | Freq: Once | INTRAVENOUS | Status: AC
  Administered 2014-04-05: 1000 mL via INTRAVENOUS

## 2014-04-05 MED ORDER — IOHEXOL 300 MG/ML  SOLN
50.0000 mL | Freq: Once | INTRAMUSCULAR | Status: AC | PRN
  Administered 2014-04-05: 50 mL via ORAL

## 2014-04-05 MED ORDER — IOHEXOL 300 MG/ML  SOLN
100.0000 mL | Freq: Once | INTRAMUSCULAR | Status: AC | PRN
  Administered 2014-04-05: 100 mL via INTRAVENOUS

## 2014-04-05 MED ORDER — HYDROCODONE-ACETAMINOPHEN 5-325 MG PO TABS: 1.0000 | ORAL_TABLET | Freq: Four times a day (QID) | ORAL | Status: DC | PRN

## 2014-04-05 NOTE — ED Notes (Signed)
Pt states chest pain with pain down left arm since Tuesday,.  Pt states pain is worse with lying down.  No changes with eating.  Has hx of reflux.  Pt states pain is same with ambulation.  No cough/congestion.

## 2014-04-05 NOTE — ED Notes (Signed)
Pt resting quietly; states pain has been constant since Tuesday; rates 7-8; skin warm and dry; sinus rhythm 82; cardiac monitoring in place; no shortness of breath; no shortness; pain increases with movement

## 2014-04-05 NOTE — ED Provider Notes (Signed)
CSN: [id number]     Arrival date & time 04/05/14  1015 History   First MD Initiated Contact with Patient 04/05/14 1159     Chief Complaint  Patient presents with  . Chest Pain     (Consider location/radiation/quality/duration/timing/severity/associated sxs/prior Treatment) HPI Patient presents to the emergency department with chest discomfort and left arm discomfort.  Since last Tuesday.  The patient is also having mid to diffuse abdominal pain.  Patient, states, that the abdominal pain, has been present since Saturday.  The patient, states she's had nausea, but no vomiting.  She had diarrhea last week.  The patient denies shortness of breath, weakness, dizziness, back pain, fever, headache, blurred vision, numbness, cough, dysuria, or syncope.  The patient, states, that she did not take any medications prior to arrival Past Medical History  Diagnosis Date  . Asthma   . Chronic bronchitis   . Arthritis   . Thyroid disease   . Neuromuscular disorder     fibromyalgia   Past Surgical History  Procedure Laterality Date  . Abdominal hysterectomy    . Foot surgery  1997   Family History  Problem Relation Age of Onset  . Arthritis Mother    History  Substance Use Topics  . Smoking status: Never Smoker   . Smokeless tobacco: Not on file  . Alcohol Use: Yes     Comment: occ   OB History   Grav Para Term Preterm Abortions TAB SAB Ect Mult Living                 Review of Systems All other systems negative except as documented in the HPI. All pertinent positives and negatives as reviewed in the HPI.   Allergies  Albuterol and Penicillins  Home Medications   Prior to Admission medications   Medication Sig Start Date End Date Taking? Authorizing Provider  cetirizine (ZYRTEC) 10 MG tablet Take 10 mg by mouth every morning.   Yes Historical Provider, MD  Cholecalciferol (VITAMIN D3) 2000 UNITS CHEW Chew 2,000 each by mouth 2 (two) times daily.    Yes Historical Provider, MD   cyclobenzaprine (FLEXERIL) 10 MG tablet Take 10 mg by mouth at bedtime.   Yes Lorenza Burton, FNP  FLUoxetine (PROZAC) 40 MG capsule Take 40 mg by mouth every morning.   Yes Historical Provider, MD  Fluticasone-Salmeterol (ADVAIR) 250-50 MCG/DOSE AEPB Inhale 1 puff into the lungs 2 (two) times daily.   Yes Historical Provider, MD  Glucosamine-Chondroit-Vit C-Mn (GLUCOSAMINE CHONDR 1500 COMPLX) CAPS Take 1 capsule by mouth every morning.   Yes Historical Provider, MD  ipratropium (ATROVENT HFA) 17 MCG/ACT inhaler Inhale 2 puffs into the lungs every 6 (six) hours as needed for wheezing.    Yes Hulen Shouts, MD  lovastatin (MEVACOR) 20 MG tablet Take 20 mg by mouth every morning.   Yes Historical Provider, MD  Misc Natural Products (COLON CLEANSER) CAPS Take 1 capsule by mouth at bedtime.   Yes Historical Provider, MD  montelukast (SINGULAIR) 10 MG tablet Take 10 mg by mouth at bedtime.   Yes Lorenza Burton, FNP  Multiple Vitamins-Minerals (HAIR/SKIN/NAILS PO) Take 2 capsules by mouth every morning.   Yes Historical Provider, MD  Multiple Vitamins-Minerals (MULTIVITAMIN GUMMIES ADULT PO) Take 2 tablets by mouth every morning.   Yes Historical Provider, MD  omeprazole (PRILOSEC) 40 MG capsule Take 40 mg by mouth 2 (two) times daily.   Yes Lorenza Burton, FNP  oxybutynin (DITROPAN-XL) 5 MG 24 hr tablet Take 5  mg by mouth at bedtime.   Yes Historical Provider, MD  Polyethyl Glycol-Propyl Glycol (SYSTANE) 0.4-0.3 % SOLN Place 1 drop into both eyes 3 (three) times daily as needed (dry eyes).    Yes Historical Provider, MD  pregabalin (LYRICA) 75 MG capsule Take 75 mg by mouth 3 (three) times daily.   Yes Lorenza Burton, FNP  topiramate (TOPAMAX) 100 MG tablet Take 100 mg by mouth at bedtime.   Yes Lorenza Burton, FNP  zonisamide (ZONEGRAN) 25 MG capsule Take 25 mg by mouth at bedtime.   Yes Vicenta Aly, FNP   BP 134/79  Pulse 94  Temp(Src) 97.9 F (36.6 C) (Oral)  Resp 13  SpO2  98% Physical Exam  Nursing note and vitals reviewed. Constitutional: She is oriented to person, place, and time. She appears well-developed and well-nourished. No distress.  HENT:  Head: Normocephalic and atraumatic.  Mouth/Throat: Oropharynx is clear and moist.  Eyes: Pupils are equal, round, and reactive to light.  Neck: Normal range of motion. Neck supple.  Cardiovascular: Normal rate, regular rhythm and normal heart sounds.  Exam reveals no gallop and no friction rub.   No murmur heard. Pulmonary/Chest: Effort normal and breath sounds normal.  Abdominal: Soft. Bowel sounds are normal. She exhibits no distension. There is tenderness. There is no rebound and no guarding.  Neurological: She is alert and oriented to person, place, and time. She exhibits normal muscle tone. Coordination normal.  Skin: Skin is warm and dry. No erythema.    ED Course  Procedures (including critical care time) Labs Review Labs Reviewed  CBC - Abnormal; Notable for the following:    RBC 5.45 (*)    MCV 71.0 (*)    MCH 22.4 (*)    All other components within normal limits  BASIC METABOLIC PANEL - Abnormal; Notable for the following:    Glucose, Bld 102 (*)    GFR calc non Af Amer 76 (*)    GFR calc Af Amer 88 (*)    All other components within normal limits  LIPASE, BLOOD  HEPATIC FUNCTION PANEL  I-STAT TROPOININ, ED    Imaging Review Dg Chest 2 View  04/05/2014   CLINICAL DATA:  LEFT side chest pain radiating to LEFT arm for 6 days, diarrhea, stomach discomfort, history asthma  EXAM: CHEST  2 VIEW  COMPARISON:  03/17/2008  FINDINGS: Upper normal heart size.  Normal mediastinal contours and pulmonary vascularity.  Lungs clear.  No pleural effusion or pneumothorax.  Mild thoracolumbar scoliosis again seen.  IMPRESSION: No acute abnormalities.   Electronically Signed   By: Lavonia Dana M.D.   On: 04/05/2014 13:13   Ct Abdomen Pelvis W Contrast  04/05/2014   CLINICAL DATA:  48 year old female with  abdominal and pelvic pain.  EXAM: CT ABDOMEN AND PELVIS WITH CONTRAST  TECHNIQUE: Multidetector CT imaging of the abdomen and pelvis was performed using the standard protocol following bolus administration of intravenous contrast.  CONTRAST:  119mL OMNIPAQUE IOHEXOL 300 MG/ML  SOLN  COMPARISON:  07/24/2004 CT  FINDINGS: The lung bases are clear.  A tiny cyst within the anterior liver is again identified. No other hepatic abnormalities are noted.  The spleen, pancreas, gallbladder, adrenal glands and kidneys are unremarkable.  There is no evidence of free fluid, enlarged lymph nodes, biliary dilation or abdominal aortic aneurysm.  The bowel, bladder and appendix are unremarkable except for scattered descending and sigmoid colonic diverticula. There is no evidence of bowel obstruction, abscess or pneumoperitoneum.  The  patient is status post hysterectomy.  No acute or suspicious bony abnormalities are identified.  IMPRESSION: No evidence of acute abnormality.  Colonic diverticulosis without evidence of diverticulitis.   Electronically Signed   By: Hassan Rowan M.D.   On: 04/05/2014 16:15      Patient will be discharged home and advised followup with primary care Dr. and patient agrees with plan.  The patient has atypical symptoms for cardiac chest pain.  She is PERC negative and low-risk  Based on Wells criteria patient's abdominal pain, is pretty, generalized and does not have a specific pattern.  Told to return here as needed for any worsening in her condition  Brent General, PA-C 04/05/14 1626

## 2014-04-05 NOTE — ED Notes (Signed)
Pt resting quietly; states pain the same; family at bedside; tel nsr

## 2014-04-05 NOTE — ED Notes (Signed)
Pt iv started and meds given as ordered; pt c/o continued chest pain; warm and dry; family at bedside; tel nsr rate 95

## 2014-04-05 NOTE — Discharge Instructions (Signed)
Return here as needed.  Followup with your primary care Dr. CT scan was negative

## 2014-04-06 NOTE — ED Provider Notes (Signed)
Medical screening examination/treatment/procedure(s) were performed by non-physician practitioner and as supervising physician I was immediately available for consultation/collaboration.   EKG Interpretation None       Virgel Manifold, MD 04/06/14 [phone]

## 2014-05-03 DIAGNOSIS — E669 Obesity, unspecified: Secondary | ICD-10-CM | POA: Insufficient documentation

## 2014-07-06 ENCOUNTER — Emergency Department (HOSPITAL_COMMUNITY): Payer: Medicare HMO

## 2014-07-06 ENCOUNTER — Encounter (HOSPITAL_COMMUNITY): Payer: Self-pay | Admitting: Emergency Medicine

## 2014-07-06 ENCOUNTER — Emergency Department (HOSPITAL_COMMUNITY)
Admission: EM | Admit: 2014-07-06 | Discharge: 2014-07-06 | Disposition: A | Discharge status: Home / Self Care | Payer: Medicare HMO | Attending: Emergency Medicine | Admitting: Emergency Medicine

## 2014-07-06 DIAGNOSIS — M1990 Unspecified osteoarthritis, unspecified site: Secondary | ICD-10-CM | POA: Insufficient documentation

## 2014-07-06 DIAGNOSIS — Z880 Allergy status to penicillin: Secondary | ICD-10-CM | POA: Diagnosis not present

## 2014-07-06 DIAGNOSIS — J45909 Unspecified asthma, uncomplicated: Secondary | ICD-10-CM | POA: Diagnosis not present

## 2014-07-06 DIAGNOSIS — R590 Localized enlarged lymph nodes: Secondary | ICD-10-CM | POA: Insufficient documentation

## 2014-07-06 DIAGNOSIS — R509 Fever, unspecified: Secondary | ICD-10-CM | POA: Diagnosis present

## 2014-07-06 DIAGNOSIS — G709 Myoneural disorder, unspecified: Secondary | ICD-10-CM | POA: Insufficient documentation

## 2014-07-06 DIAGNOSIS — Z79899 Other long term (current) drug therapy: Secondary | ICD-10-CM | POA: Diagnosis not present

## 2014-07-06 DIAGNOSIS — Z8639 Personal history of other endocrine, nutritional and metabolic disease: Secondary | ICD-10-CM | POA: Insufficient documentation

## 2014-07-06 DIAGNOSIS — B349 Viral infection, unspecified: Secondary | ICD-10-CM | POA: Diagnosis not present

## 2014-07-06 LAB — RAPID STREP SCREEN (MED CTR MEBANE ONLY): STREPTOCOCCUS, GROUP A SCREEN (DIRECT): NEGATIVE

## 2014-07-06 MED ORDER — ONDANSETRON 8 MG PO TBDP: 8.0000 mg | ORAL_TABLET | Freq: Three times a day (TID) | ORAL | Status: DC | PRN

## 2014-07-06 MED ORDER — SODIUM CHLORIDE 0.9 % IV BOLUS (SEPSIS)
1000.0000 mL | Freq: Once | INTRAVENOUS | Status: AC
Start: 2014-07-06 — End: 2014-07-06
  Administered 2014-07-06: 1000 mL via INTRAVENOUS

## 2014-07-06 MED ORDER — OXYCODONE-ACETAMINOPHEN 5-325 MG PO TABS: 1.0000 | ORAL_TABLET | Freq: Four times a day (QID) | ORAL | Status: DC | PRN

## 2014-07-06 MED ORDER — KETOROLAC TROMETHAMINE 30 MG/ML IJ SOLN
30.0000 mg | Freq: Once | INTRAMUSCULAR | Status: AC
  Administered 2014-07-06: 30 mg via INTRAVENOUS
  Filled 2014-07-06: qty 1

## 2014-07-06 NOTE — ED Provider Notes (Signed)
CSN: [id number]     Arrival date & time 07/06/14  0908 History   First MD Initiated Contact with Patient 07/06/14 1104     Chief Complaint  Patient presents with  . Sore Throat  . Fever     (Consider location/radiation/quality/duration/timing/severity/associated sxs/prior Treatment) Patient is a 48 y.o. female presenting with pharyngitis and fever. The history is provided by the spouse.  Sore Throat This is a new problem. Pertinent negatives include no chest pain, no abdominal pain, no headaches and no shortness of breath.  Fever Associated symptoms: chills, cough, myalgias and rhinorrhea   Associated symptoms: no chest pain, no diarrhea, no headaches, no nausea, no rash and no vomiting    patient has sore throat for the last few days. Worse with swallowing. She's had fevers up to 102. She's had a cough and had hemoptysis. states it was a fair amount of blood. No vomiting. No diarrhea. No sick contacts. Her muscles hurt and she's had a good appetite. She has a dull headache. She states she's otherwise healthy. She does not smoke. The pain is sharp  Past Medical History  Diagnosis Date  . Asthma   . Chronic bronchitis   . Arthritis   . Thyroid disease   . Neuromuscular disorder     fibromyalgia   Past Surgical History  Procedure Laterality Date  . Abdominal hysterectomy    . Foot surgery  1997   Family History  Problem Relation Age of Onset  . Arthritis Mother    History  Substance Use Topics  . Smoking status: Never Smoker   . Smokeless tobacco: Not on file  . Alcohol Use: Yes     Comment: occ   OB History   Grav Para Term Preterm Abortions TAB SAB Ect Mult Living                 Review of Systems  Constitutional: Positive for fever, chills and fatigue. Negative for activity change and appetite change.  HENT: Positive for rhinorrhea. Voice change: harsh voice.   Eyes: Negative for pain.  Respiratory: Positive for cough. Negative for chest tightness and shortness  of breath.   Cardiovascular: Negative for chest pain and leg swelling.  Gastrointestinal: Negative for nausea, vomiting, abdominal pain and diarrhea.  Genitourinary: Negative for flank pain.  Musculoskeletal: Positive for myalgias. Negative for back pain and neck stiffness.  Skin: Negative for rash.  Neurological: Negative for weakness, numbness and headaches.  Psychiatric/Behavioral: Negative for behavioral problems.      Allergies  Albuterol and Penicillins  Home Medications   Prior to Admission medications   Medication Sig Start Date End Date Taking? Authorizing Provider  ADVANCED FIBER COMPLEX CAPS Take 2 capsules by mouth daily.   Yes Historical Provider, MD  benzonatate (TESSALON) 100 MG capsule Take 100 mg by mouth 3 (three) times daily as needed for cough.   Yes Historical Provider, MD  Black Cohosh 175 MG CAPS Take 2 capsules by mouth daily.   Yes Historical Provider, MD  cetirizine (ZYRTEC) 10 MG tablet Take 10 mg by mouth at bedtime.    Yes Historical Provider, MD  Cholecalciferol (VITAMIN D3) 2000 UNITS CHEW Chew 4,000 each by mouth daily.    Yes Historical Provider, MD  cyclobenzaprine (FLEXERIL) 10 MG tablet Take 10 mg by mouth at bedtime.   Yes Lorenza Burton, FNP  diclofenac (VOLTAREN) 75 MG EC tablet Take 75 mg by mouth 2 (two) times daily as needed for mild pain.   Yes Historical  Provider, MD  FLUoxetine (PROZAC) 40 MG capsule Take 40 mg by mouth every morning.   Yes Historical Provider, MD  Fluticasone-Salmeterol (ADVAIR) 250-50 MCG/DOSE AEPB Inhale 1 puff into the lungs 2 (two) times daily.   Yes Historical Provider, MD  Glucosamine-Chondroit-Vit C-Mn (GLUCOSAMINE CHONDR 1500 COMPLX) CAPS Take 1 capsule by mouth every morning.   Yes Historical Provider, MD  ipratropium (ATROVENT HFA) 17 MCG/ACT inhaler Inhale 2 puffs into the lungs every 6 (six) hours as needed for wheezing.    Yes Carola Mitzi Davenport, MD  Misc Natural Products (COLON CLEANSER) CAPS Take 1 capsule  by mouth every 30 (thirty) days.    Yes Historical Provider, MD  montelukast (SINGULAIR) 10 MG tablet Take 10 mg by mouth at bedtime.   Yes Lorenza Burton, FNP  Multiple Vitamins-Minerals (HAIR/SKIN/NAILS PO) Take 2 capsules by mouth every morning.   Yes Historical Provider, MD  Multiple Vitamins-Minerals (MULTIVITAMIN GUMMIES ADULT PO) Take 2 tablets by mouth every morning.   Yes Historical Provider, MD  omeprazole (PRILOSEC) 40 MG capsule Take 40 mg by mouth 2 (two) times daily.   Yes Lorenza Burton, FNP  oxybutynin (DITROPAN-XL) 5 MG 24 hr tablet Take 5 mg by mouth at bedtime.   Yes Historical Provider, MD  Polyethyl Glycol-Propyl Glycol (SYSTANE) 0.4-0.3 % SOLN Place 1 drop into both eyes 3 (three) times daily as needed (dry eyes).    Yes Historical Provider, MD  pregabalin (LYRICA) 75 MG capsule Take 75 mg by mouth 3 (three) times daily.   Yes Lorenza Burton, FNP  topiramate (TOPAMAX) 100 MG tablet Take 100 mg by mouth at bedtime.   Yes Lorenza Burton, FNP  zonisamide (ZONEGRAN) 100 MG capsule Take 100 mg by mouth See admin instructions. Takes 100mg  at onset of migraine, may repeat in 2 hours if headache persits   Yes Historical Provider, MD  ondansetron (ZOFRAN-ODT) 8 MG disintegrating tablet Take 1 tablet (8 mg total) by mouth every 8 (eight) hours as needed for nausea or vomiting. 07/06/14   Jasper Riling. Iori Gigante, MD  oxyCODONE-acetaminophen (PERCOCET/ROXICET) 5-325 MG per tablet Take 1-2 tablets by mouth every 6 (six) hours as needed for severe pain. 07/06/14   Jasper Riling. Myrissa Chipley, MD   BP 134/94  Pulse 122  Temp(Src) 99.8 F (37.7 C) (Oral)  Resp 18  SpO2 98% Physical Exam  Nursing note and vitals reviewed. Constitutional: She appears well-developed.  Patient appears uncomfortable.  HENT:  Mouth/Throat: No oropharyngeal exudate.  Posterior pharyngeal erythema and some tonsillar swelling, worse on the right. Uvula midline. No exudate  Eyes: Pupils are equal, round, and reactive  to light.  Neck: Normal range of motion. Neck supple.  Cardiovascular: Regular rhythm.   Mild tachycardia  Pulmonary/Chest:  Slightly harsh breath sounds without localizing symptoms  Abdominal: Soft. There is no tenderness.  Musculoskeletal: Normal range of motion.  Lymphadenopathy:    She has cervical adenopathy.  Neurological: She is alert.  Skin: Skin is warm.    ED Course  Procedures (including critical care time) Labs Review Labs Reviewed  RAPID STREP SCREEN  CULTURE, GROUP A STREP    Imaging Review Dg Chest 2 View  07/06/2014   CLINICAL DATA:  Cough, congestion, shortness of breath, fever since yesterday  EXAM: CHEST  2 VIEW  COMPARISON:  04/05/2014  FINDINGS: Cardiomediastinal silhouette is stable. No acute infiltrate or pleural effusion. No pulmonary edema. Mild lower thoracic dextroscoliosis.  IMPRESSION: No acute infiltrate or pulmonary edema. Lower thoracic dextroscoliosis.   Electronically Signed  By: Lahoma Crocker M.D.   On: 07/06/2014 11:26     EKG Interpretation None      MDM   Final diagnoses:  Viral infection    Patient with sore throat cough and mild hemoptysis. No other bleeding. X-ray reassuring. Strep negative. Patient feels better and her heart rate has decreased. She is tolerated orals will be discharged home.    Jasper Riling. Alvino Chapel, MD 07/06/14 1321

## 2014-07-06 NOTE — ED Notes (Signed)
MD at bedside. 

## 2014-07-06 NOTE — ED Notes (Addendum)
Pt reports sore throat starting yesterday, voice is hoarse.\, pain 10/10. Reports she "coughed up a blood clot". Denies n/v. Reports fever at home was 101.9, has not taken anything for fever, unable to take tylenol because she takes topomax for headaches.

## 2014-07-06 NOTE — Discharge Instructions (Signed)

## 2014-07-07 LAB — CULTURE, GROUP A STREP

## 2014-11-15 DIAGNOSIS — G43909 Migraine, unspecified, not intractable, without status migrainosus: Secondary | ICD-10-CM | POA: Insufficient documentation

## 2014-11-22 DIAGNOSIS — G5602 Carpal tunnel syndrome, left upper limb: Secondary | ICD-10-CM | POA: Insufficient documentation

## 2015-03-13 ENCOUNTER — Encounter (HOSPITAL_COMMUNITY): Payer: Self-pay | Admitting: *Deleted

## 2015-03-13 ENCOUNTER — Emergency Department (HOSPITAL_COMMUNITY): Payer: Medicare HMO

## 2015-03-13 ENCOUNTER — Emergency Department (HOSPITAL_COMMUNITY)
Admission: EM | Admit: 2015-03-13 | Discharge: 2015-03-13 | Disposition: A | Discharge status: Home / Self Care | Payer: Medicare HMO | Attending: Emergency Medicine | Admitting: Emergency Medicine

## 2015-03-13 DIAGNOSIS — J45909 Unspecified asthma, uncomplicated: Secondary | ICD-10-CM | POA: Diagnosis not present

## 2015-03-13 DIAGNOSIS — M1990 Unspecified osteoarthritis, unspecified site: Secondary | ICD-10-CM | POA: Diagnosis not present

## 2015-03-13 DIAGNOSIS — Z79899 Other long term (current) drug therapy: Secondary | ICD-10-CM | POA: Insufficient documentation

## 2015-03-13 DIAGNOSIS — R1031 Right lower quadrant pain: Secondary | ICD-10-CM | POA: Diagnosis not present

## 2015-03-13 DIAGNOSIS — M797 Fibromyalgia: Secondary | ICD-10-CM | POA: Insufficient documentation

## 2015-03-13 DIAGNOSIS — H53149 Visual discomfort, unspecified: Secondary | ICD-10-CM | POA: Insufficient documentation

## 2015-03-13 DIAGNOSIS — Z7951 Long term (current) use of inhaled steroids: Secondary | ICD-10-CM | POA: Diagnosis not present

## 2015-03-13 DIAGNOSIS — Z880 Allergy status to penicillin: Secondary | ICD-10-CM | POA: Diagnosis not present

## 2015-03-13 DIAGNOSIS — R1013 Epigastric pain: Secondary | ICD-10-CM | POA: Diagnosis not present

## 2015-03-13 DIAGNOSIS — R112 Nausea with vomiting, unspecified: Secondary | ICD-10-CM | POA: Insufficient documentation

## 2015-03-13 DIAGNOSIS — R1032 Left lower quadrant pain: Secondary | ICD-10-CM | POA: Diagnosis not present

## 2015-03-13 DIAGNOSIS — R1010 Upper abdominal pain, unspecified: Secondary | ICD-10-CM

## 2015-03-13 DIAGNOSIS — Z8639 Personal history of other endocrine, nutritional and metabolic disease: Secondary | ICD-10-CM | POA: Insufficient documentation

## 2015-03-13 DIAGNOSIS — Z90710 Acquired absence of both cervix and uterus: Secondary | ICD-10-CM | POA: Insufficient documentation

## 2015-03-13 DIAGNOSIS — R51 Headache: Secondary | ICD-10-CM | POA: Diagnosis not present

## 2015-03-13 DIAGNOSIS — G43009 Migraine without aura, not intractable, without status migrainosus: Secondary | ICD-10-CM

## 2015-03-13 DIAGNOSIS — R109 Unspecified abdominal pain: Secondary | ICD-10-CM | POA: Diagnosis present

## 2015-03-13 LAB — COMPREHENSIVE METABOLIC PANEL
ALBUMIN: 3.7 g/dL (ref 3.5–5.0)
ALK PHOS: 70 U/L (ref 38–126)
ALT: 28 U/L (ref 14–54)
AST: 29 U/L (ref 15–41)
Anion gap: 7 (ref 5–15)
BUN: 11 mg/dL (ref 6–20)
CALCIUM: 9.1 mg/dL (ref 8.9–10.3)
CHLORIDE: 107 mmol/L (ref 101–111)
CO2: 28 mmol/L (ref 22–32)
CREATININE: 0.82 mg/dL (ref 0.44–1.00)
GFR calc non Af Amer: >60 mL/min (ref >60)
Glucose, Bld: 133 mg/dL — ABNORMAL HIGH (ref 65–99)
Potassium: 3.4 mmol/L — ABNORMAL LOW (ref 3.5–5.1)
SODIUM: 142 mmol/L (ref 135–145)
Total Bilirubin: 0.7 mg/dL (ref 0.3–1.2)
Total Protein: 7.6 g/dL (ref 6.5–8.1)

## 2015-03-13 LAB — CBC WITH DIFFERENTIAL/PLATELET
BASOS PCT: 0 % (ref 0–1)
Basophils Absolute: 0 10*3/uL (ref 0.0–0.1)
Eosinophils Absolute: 0.2 10*3/uL (ref 0.0–0.7)
Eosinophils Relative: 2 % (ref 0–5)
HCT: 37.9 % (ref 36.0–46.0)
Hemoglobin: 11.7 g/dL — ABNORMAL LOW (ref 12.0–15.0)
LYMPHS PCT: 22 % (ref 12–46)
Lymphs Abs: 2 10*3/uL (ref 0.7–4.0)
MCH: 22.5 pg — AB (ref 26.0–34.0)
MCHC: 30.9 g/dL (ref 30.0–36.0)
MCV: 72.7 fL — AB (ref 78.0–100.0)
MONOS PCT: 6 % (ref 3–12)
Monocytes Absolute: 0.6 10*3/uL (ref 0.1–1.0)
NEUTROS PCT: 70 % (ref 43–77)
Neutro Abs: 6.4 10*3/uL (ref 1.7–7.7)
Platelets: 242 10*3/uL (ref 150–400)
RBC: 5.21 MIL/uL — ABNORMAL HIGH (ref 3.87–5.11)
RDW: 16 % — AB (ref 11.5–15.5)
WBC: 9.2 10*3/uL (ref 4.0–10.5)

## 2015-03-13 LAB — URINALYSIS, ROUTINE W REFLEX MICROSCOPIC
BILIRUBIN URINE: NEGATIVE
Glucose, UA: NEGATIVE mg/dL
HGB URINE DIPSTICK: NEGATIVE
Ketones, ur: NEGATIVE mg/dL
Leukocytes, UA: NEGATIVE
Nitrite: NEGATIVE
PH: 8 (ref 5.0–8.0)
Protein, ur: NEGATIVE mg/dL
SPECIFIC GRAVITY, URINE: 1.012 (ref 1.005–1.030)
Urobilinogen, UA: 0.2 mg/dL (ref 0.0–1.0)

## 2015-03-13 LAB — I-STAT TROPONIN, ED: TROPONIN I, POC: 0 ng/mL (ref 0.00–0.08)

## 2015-03-13 LAB — LIPASE, BLOOD: LIPASE: 20 U/L — AB (ref 22–51)

## 2015-03-13 MED ORDER — ONDANSETRON HCL 4 MG PO TABS: 4.0000 mg | ORAL_TABLET | Freq: Three times a day (TID) | ORAL | Status: DC | PRN

## 2015-03-13 MED ORDER — HYDROMORPHONE HCL 1 MG/ML IJ SOLN: 1.0000 mg | Freq: Once | INTRAMUSCULAR | Status: DC

## 2015-03-13 MED ORDER — SODIUM CHLORIDE 0.9 % IV BOLUS (SEPSIS)
1000.0000 mL | Freq: Once | INTRAVENOUS | Status: AC
  Administered 2015-03-13: 1000 mL via INTRAVENOUS

## 2015-03-13 MED ORDER — OXYCODONE-ACETAMINOPHEN 5-325 MG PO TABS: 1.0000 | ORAL_TABLET | Freq: Four times a day (QID) | ORAL | Status: DC | PRN

## 2015-03-13 MED ORDER — DIPHENHYDRAMINE HCL 50 MG/ML IJ SOLN
25.0000 mg | Freq: Once | INTRAMUSCULAR | Status: AC
  Administered 2015-03-13: 25 mg via INTRAVENOUS
  Filled 2015-03-13: qty 1

## 2015-03-13 MED ORDER — HYDROMORPHONE HCL 1 MG/ML IJ SOLN
1.0000 mg | Freq: Once | INTRAMUSCULAR | Status: AC
Start: 2015-03-13 — End: 2015-03-13
  Administered 2015-03-13: 1 mg via INTRAVENOUS
  Filled 2015-03-13: qty 1

## 2015-03-13 MED ORDER — PROCHLORPERAZINE EDISYLATE 5 MG/ML IJ SOLN
10.0000 mg | Freq: Once | INTRAMUSCULAR | Status: AC
  Administered 2015-03-13: 10 mg via INTRAVENOUS
  Filled 2015-03-13: qty 2

## 2015-03-13 MED ORDER — OXYCODONE-ACETAMINOPHEN 5-325 MG PO TABS
2.0000 | ORAL_TABLET | Freq: Once | ORAL | Status: AC
  Administered 2015-03-13: 2 via ORAL
  Filled 2015-03-13: qty 2

## 2015-03-13 MED ORDER — IOHEXOL 300 MG/ML  SOLN
100.0000 mL | Freq: Once | INTRAMUSCULAR | Status: AC | PRN
  Administered 2015-03-13: 100 mL via INTRAVENOUS

## 2015-03-13 MED ORDER — ONDANSETRON HCL 4 MG/2ML IJ SOLN
4.0000 mg | Freq: Once | INTRAMUSCULAR | Status: AC
  Administered 2015-03-13: 4 mg via INTRAVENOUS
  Filled 2015-03-13: qty 2

## 2015-03-13 MED ORDER — GI COCKTAIL ~~LOC~~
30.0000 mL | Freq: Once | ORAL | Status: AC
  Administered 2015-03-13: 30 mL via ORAL
  Filled 2015-03-13: qty 30

## 2015-03-13 NOTE — ED Notes (Signed)
Lab draw unsuccessful rn made aware

## 2015-03-13 NOTE — Discharge Instructions (Signed)
Abdominal Pain Many things can cause abdominal pain. Usually, abdominal pain is not caused by a disease and will improve without treatment. It can often be observed and treated at home. Your health care provider will do a physical exam and possibly order blood tests and X-rays to help determine the seriousness of your pain. However, in many cases, more time must pass before a clear cause of the pain can be found. Before that point, your health care provider may not know if you need more testing or further treatment. HOME CARE INSTRUCTIONS  Monitor your abdominal pain for any changes. The following actions may help to alleviate any discomfort you are experiencing:  Only take over-the-counter or prescription medicines as directed by your health care provider.  Do not take laxatives unless directed to do so by your health care provider.  Try a clear liquid diet (broth, tea, or water) as directed by your health care provider. Slowly move to a bland diet as tolerated. SEEK MEDICAL CARE IF:  You have unexplained abdominal pain.  You have abdominal pain associated with nausea or diarrhea.  You have pain when you urinate or have a bowel movement.  You experience abdominal pain that wakes you in the night.  You have abdominal pain that is worsened or improved by eating food.  You have abdominal pain that is worsened with eating fatty foods.  You have a fever. SEEK IMMEDIATE MEDICAL CARE IF:   Your pain does not go away within 2 hours.  You keep throwing up (vomiting).  Your pain is felt only in portions of the abdomen, such as the right side or the left lower portion of the abdomen.  You pass bloody or black tarry stools. MAKE SURE YOU:  Understand these instructions.   Will watch your condition.   Will get help right away if you are not doing well or get worse.  Document Released: 06/20/2005 Document Revised: 09/15/2013 Document Reviewed: 05/20/2013 [facility] Patient Information  2015 [place], [place]. This information is not intended to replace advice given to you by your health care provider. Make sure you discuss any questions you have with your health care provider.  Migraine Headache A migraine headache is an intense, throbbing pain on one or both sides of your head. A migraine can last for 30 minutes to several hours. CAUSES  The exact cause of a migraine headache is not always known. However, a migraine may be caused when nerves in the brain become irritated and release chemicals that cause inflammation. This causes pain. Certain things may also trigger migraines, such as:  Alcohol.  Smoking.  Stress.  Menstruation.  Aged cheeses.  Foods or drinks that contain nitrates, glutamate, aspartame, or tyramine.  Lack of sleep.  Chocolate.  Caffeine.  Hunger.  Physical exertion.  Fatigue.  Medicines used to treat chest pain (nitroglycerine), birth control pills, estrogen, and some blood pressure medicines. SIGNS AND SYMPTOMS  Pain on one or both sides of your head.  Pulsating or throbbing pain.  Severe pain that prevents daily activities.  Pain that is aggravated by any physical activity.  Nausea, vomiting, or both.  Dizziness.  Pain with exposure to bright lights, loud noises, or activity.  General sensitivity to bright lights, loud noises, or smells. Before you get a migraine, you may get warning signs that a migraine is coming (aura). An aura may include:  Seeing flashing lights.  Seeing bright spots, halos, or zigzag lines.  Having tunnel vision or blurred vision.  Having feelings  of numbness or tingling.  Having trouble talking.  Having muscle weakness. DIAGNOSIS  A migraine headache is often diagnosed based on:  Symptoms.  Physical exam.  A CT scan or MRI of your head. These imaging tests cannot diagnose migraines, but they can help rule out other causes of headaches. TREATMENT Medicines may be given for pain and  nausea. Medicines can also be given to help prevent recurrent migraines.  HOME CARE INSTRUCTIONS  Only take over-the-counter or prescription medicines for pain or discomfort as directed by your health care provider. The use of long-term narcotics is not recommended.  Lie down in a dark, quiet room when you have a migraine.  Keep a journal to find out what may trigger your migraine headaches. For example, write down:  What you eat and drink.  How much sleep you get.  Any change to your diet or medicines.  Limit alcohol consumption.  Quit smoking if you smoke.  Get 7-9 hours of sleep, or as recommended by your health care provider.  Limit stress.  Keep lights dim if bright lights bother you and make your migraines worse. SEEK IMMEDIATE MEDICAL CARE IF:   Your migraine becomes severe.  You have a fever.  You have a stiff neck.  You have vision loss.  You have muscular weakness or loss of muscle control.  You start losing your balance or have trouble walking.  You feel faint or pass out.  You have severe symptoms that are different from your first symptoms. MAKE SURE YOU:   Understand these instructions.  Will watch your condition.  Will get help right away if you are not doing well or get worse. Document Released: 09/10/2005 Document Revised: 01/25/2014 Document Reviewed: 05/18/2013 [facility] Patient Information 2015 [place], [place]. This information is not intended to replace advice given to you by your health care provider. Make sure you discuss any questions you have with your health care provider.

## 2015-03-13 NOTE — ED Notes (Signed)
Pt able to consume [place] sandwich and ice water, no nausea or vomiting.

## 2015-03-13 NOTE — ED Notes (Signed)
Pt was given ice water and [place] sandwich.

## 2015-03-13 NOTE — ED Notes (Addendum)
MD at bedside.  At present, MD is palpating patients abdomen.

## 2015-03-13 NOTE — ED Notes (Signed)
Pt to the restroom, will complete EKG when pt returns.

## 2015-03-13 NOTE — ED Notes (Signed)
Woke with migraine headache today, aching pain, with nausea and photophobia. Pain typical of historical migraines

## 2015-03-13 NOTE — ED Provider Notes (Signed)
CSN: [id number]     Arrival date & time 03/13/15  1757 History   First MD Initiated Contact with Patient 03/13/15 1813     Chief Complaint  Patient presents with  . Abdominal Pain     (Consider location/radiation/quality/duration/timing/severity/associated sxs/prior Treatment) HPI Christine Bradley is a 49 year old female with past medical history of migraines, asthma, arthritis, thyroid disease who presents the ER with 2 different complaints. Patient states over the past 2 weeks she's had a gradual onset of constant abdominal pain. She states her pain has been constant throughout each day, and has been gradually worsening.  Patient reports associated bloating sensation of her abdomen, nausea, dry heaving. Patient reports aggravation of her symptoms with eating, lying flat, mild alleviation when standing or sitting up. Patient states this morning she notes a gradual onset of headache, which is frontal in nature, throbbing, with associated phonophobia, photophobia, nausea and vomiting. Patient states her headache is consistent with an identical to migraines she has experienced the past. Patient states she took a Topamax at home without any relief. Patient denies any chest pain, shortness of breath, dizziness, blurred vision, weakness, fever, dysuria, diarrhea, constipation. Patient denies hematochezia, melena.  Past Medical History  Diagnosis Date  . Asthma   . Chronic bronchitis   . Arthritis   . Thyroid disease   . Neuromuscular disorder     fibromyalgia   Past Surgical History  Procedure Laterality Date  . Abdominal hysterectomy    . Foot surgery  1997   Family History  Problem Relation Age of Onset  . Arthritis Mother    History  Substance Use Topics  . Smoking status: Never Smoker   . Smokeless tobacco: Not on file  . Alcohol Use: Yes     Comment: occ   OB History    No data available     Review of Systems  Constitutional: Negative for fever.  HENT: Negative for trouble  swallowing.   Eyes: Positive for photophobia. Negative for visual disturbance.  Respiratory: Negative for shortness of breath.   Cardiovascular: Negative for chest pain.  Gastrointestinal: Positive for nausea, vomiting and abdominal pain. Negative for diarrhea, constipation and blood in stool.  Genitourinary: Negative for dysuria.  Musculoskeletal: Negative for neck pain.  Skin: Negative for rash.  Neurological: Positive for headaches. Negative for dizziness, weakness and numbness.  Psychiatric/Behavioral: Negative.       Allergies  Albuterol and Penicillins  Home Medications   Prior to Admission medications   Medication Sig Start Date End Date Taking? Authorizing Provider  Black Cohosh 175 MG CAPS Take 280 mg by mouth daily.    Yes Historical Provider, MD  cetirizine (ZYRTEC) 10 MG tablet Take 10 mg by mouth at bedtime.    Yes Historical Provider, MD  Cholecalciferol (VITAMIN D3) 2000 UNITS CHEW Chew 4,000 each by mouth daily.    Yes Historical Provider, MD  conjugated estrogens (PREMARIN) vaginal cream Place 0.5gm vaginally every night x 2 wks then 2 nights per week 08/03/13  Yes Historical Provider, MD  cyclobenzaprine (FLEXERIL) 10 MG tablet Take 10 mg by mouth at bedtime.   Yes Lorenza Burton, FNP  diclofenac (VOLTAREN) 75 MG EC tablet Take 75 mg by mouth 2 (two) times daily as needed for mild pain.   Yes Historical Provider, MD  FIBER SELECT GUMMIES CHEW Chew 2 tablets by mouth daily.   Yes Historical Provider, MD  FLUoxetine (PROZAC) 40 MG capsule Take 40 mg by mouth every morning.   Yes Historical Provider,  MD  Fluticasone-Salmeterol (ADVAIR) 250-50 MCG/DOSE AEPB Inhale 1 puff into the lungs 2 (two) times daily.   Yes Historical Provider, MD  Glucosamine-Chondroit-Vit C-Mn (GLUCOSAMINE CHONDR 1500 COMPLX) CAPS Take 1 capsule by mouth every morning.   Yes Historical Provider, MD  ipratropium (ATROVENT HFA) 17 MCG/ACT inhaler Inhale 2 puffs into the lungs every 6 (six) hours as  needed for wheezing.    Yes Angelina Pih, MD  lidocaine (LIDODERM) 5 % Place 1 patch onto the skin daily as needed (pain). Remove & Discard patch within 12 hours or as directed by MD   Yes Historical Provider, MD  lovastatin (MEVACOR) 40 MG tablet Take 40 mg by mouth daily.  05/04/14 05/04/15 Yes Historical Provider, MD  montelukast (SINGULAIR) 10 MG tablet Take 10 mg by mouth at bedtime.   Yes Lorenza Burton, FNP  Multiple Vitamins-Minerals (HAIR/SKIN/NAILS PO) Take 2 tablets by mouth every morning.    Yes Historical Provider, MD  Multiple Vitamins-Minerals (MULTIVITAMIN GUMMIES ADULT PO) Take 2 tablets by mouth every morning.   Yes Historical Provider, MD  oxybutynin (DITROPAN-XL) 5 MG 24 hr tablet Take 5 mg by mouth at bedtime.   Yes Historical Provider, MD  pantoprazole (PROTONIX) 40 MG tablet Take 40 mg by mouth. 11/03/14 11/03/15 Yes Historical Provider, MD  Polyethyl Glycol-Propyl Glycol (SYSTANE) 0.4-0.3 % SOLN Place 1 drop into both eyes 3 (three) times daily as needed (dry eyes).    Yes Historical Provider, MD  pregabalin (LYRICA) 75 MG capsule Take 75 mg by mouth 3 (three) times daily.   Yes Lorenza Burton, FNP  rizatriptan (MAXALT-MLT) 10 MG disintegrating tablet Take 10 mg by mouth every 2 (two) hours as needed for migraine.  12/09/14  Yes Historical Provider, MD  topiramate (TOPAMAX) 100 MG tablet Take 100 mg by mouth daily.    Yes Lorenza Burton, FNP  ondansetron (ZOFRAN) 4 MG tablet Take 1 tablet (4 mg total) by mouth every 8 (eight) hours as needed for nausea or vomiting. 03/13/15   Dahlia Bailiff, PA-C  ondansetron (ZOFRAN-ODT) 8 MG disintegrating tablet Take 1 tablet (8 mg total) by mouth every 8 (eight) hours as needed for nausea or vomiting. Patient not taking: Reported on 03/13/2015 07/06/14   Davonna Belling, MD  oxyCODONE-acetaminophen (PERCOCET) 5-325 MG per tablet Take 1-2 tablets by mouth every 6 (six) hours as needed. 03/13/15   Dahlia Bailiff, PA-C   BP 128/80 mmHg  Pulse 96   Temp(Src) 98.2 F (36.8 C) (Oral)  Resp 18  SpO2 97% Physical Exam  Constitutional: She is oriented to person, place, and time. She appears well-developed and well-nourished. No distress.  HENT:  Head: Normocephalic and atraumatic.  Mouth/Throat: Oropharynx is clear and moist. No oropharyngeal exudate.  Eyes: Right eye exhibits no discharge. Left eye exhibits no discharge. No scleral icterus.  Neck: Normal range of motion.  Cardiovascular: Normal rate, regular rhythm and normal heart sounds.   No murmur heard. Pulmonary/Chest: Effort normal and breath sounds normal. No respiratory distress.  Abdominal: Soft. There is tenderness in the right lower quadrant, epigastric area, suprapubic area and left lower quadrant. There is no rigidity, no guarding, no tenderness at McBurney's point and negative Murphy's sign.    Generalized tenderness more significant in epigastrium.  Musculoskeletal: Normal range of motion. She exhibits no edema or tenderness.  Neurological: She is alert and oriented to person, place, and time. She has normal strength. No cranial nerve deficit or sensory deficit. Coordination and gait normal. GCS eye subscore is 4. GCS  verbal subscore is 5. GCS motor subscore is 6.  Patient fully alert, answering questions appropriately in full, clear sentences. Cranial nerves II through XII grossly intact. Motor strength 5 out of 5 in all major muscle groups of upper and lower extremities. Distal sensation intact.   Skin: Skin is warm and dry. No rash noted. She is not diaphoretic.  Psychiatric: She has a normal mood and affect.  Nursing note and vitals reviewed.   ED Course  Procedures (including critical care time) Labs Review Labs Reviewed  CBC WITH DIFFERENTIAL/PLATELET - Abnormal; Notable for the following:    RBC 5.21 (*)    Hemoglobin 11.7 (*)    MCV 72.7 (*)    MCH 22.5 (*)    RDW 16.0 (*)    All other components within normal limits  COMPREHENSIVE METABOLIC PANEL -  Abnormal; Notable for the following:    Potassium 3.4 (*)    Glucose, Bld 133 (*)    All other components within normal limits  LIPASE, BLOOD - Abnormal; Notable for the following:    Lipase 20 (*)    All other components within normal limits  URINALYSIS, ROUTINE W REFLEX MICROSCOPIC (NOT AT [facility])  I-STAT TROPOININ, ED    Imaging Review Ct Abdomen Pelvis W Contrast  03/13/2015   CLINICAL DATA:  Lower abdominal pain for several weeks  EXAM: CT ABDOMEN AND PELVIS WITH CONTRAST  TECHNIQUE: Multidetector CT imaging of the abdomen and pelvis was performed using the standard protocol following bolus administration of intravenous contrast. Oral contrast was also administered.  CONTRAST:  171mL OMNIPAQUE IOHEXOL 300 MG/ML  SOLN  COMPARISON:  April 05, 2014  FINDINGS: Lung bases are clear.  There is a stable 4 mm cyst in the medial segment of the left lobe of the liver. No other focal liver lesions are identified. Gallbladder wall is not thickened. There is no biliary duct dilatation.  Spleen, pancreas, and adrenals appear normal.  Kidneys bilaterally show no mass or hydronephrosis on either side. There is no renal or ureteral calculus on either side.  In the pelvis, the urinary bladder is midline with normal wall thickness. There are multiple sigmoid diverticula without diverticulitis. There is no pelvic mass or pelvic fluid collection. The appendix appears normal.  There is no demonstrable bowel obstruction. No free air or portal venous air. There is no demonstrable ascites, adenopathy, or abscess in the abdomen or pelvis. There is no abdominal aortic aneurysm. There are no blastic or lytic bone lesions. There is lumbar levoscoliosis.  IMPRESSION: No bowel obstruction or abscess. No bowel wall or mesenteric thickening.  There are multiple colonic diverticula without diverticulitis.  Appendix appears normal. No renal or ureteral calculus. No hydronephrosis.   Electronically Signed   By: Lowella Grip III  M.D.   On: 03/13/2015 21:48   Dg Abd Acute W/chest  03/13/2015   CLINICAL DATA:  Upper abdominal pain for 2 weeks. Initial encounter.  EXAM: DG ABDOMEN ACUTE W/ 1V CHEST  COMPARISON:  PA and lateral chest 07/06/2014. CT abdomen and pelvis 04/05/2014.  FINDINGS: Single view of the chest demonstrates clear lungs and normal heart size. No pneumothorax or pleural effusion. Two views of the abdomen show no free intraperitoneal air. The bowel gas pattern is normal. No abnormal abdominal calcification is seen. Thoracolumbar scoliosis noted.  IMPRESSION: No acute finding chest or abdomen.   Electronically Signed   By: Inge Rise M.D.   On: 03/13/2015 20:03     EKG Interpretation  Date/Time:  Sunday March 13 2015 18:52:09 EDT Ventricular Rate:  84 PR Interval:  188 QRS Duration: 73 QT Interval:  360 QTC Calculation: 425 R Axis:   59 Text Interpretation:  Sinus rhythm Low voltage, precordial leads Baseline  wander in lead(s) V1 V6 No significant change since last tracing Confirmed  by ALLEN  MD, ANTHONY ([id number]) on 03/13/2015 8:21:33 PM      MDM   Final diagnoses:  Upper abdominal pain  Nonintractable migraine, unspecified migraine type   Patient is to complaints, patient reporting abdominal pain over the past several weeks, worsening recently. Given patient has had hysterectomy, bilateral salpingo-oophorectomy, there is some concern for small bowel obstruction in the setting of patient describing abdominal bloating, worsening pain with eating. Of note patient does have some gastric esophageal reflux, uses PPI daily basis. Patient's pain is all upper abdomen. EKG is without evidence of injury or ectopy. Troponin negative, greater than 6 hours after onset of symptoms. No concern for ACS. Chest x-ray unremarkable for acute pathology.  1. Headache Pt HA treated and improved while in ED.  Presentation is like pts typical HA and non concerning for [facility], [facility], Meningitis, or temporal arteritis. Pt  is afebrile with no focal neuro deficits, nuchal rigidity, or change in vision. Pt is to follow up with PCP to discuss prophylactic medication. Pt verbalizes understanding and is agreeable with plan to dc.   2. Abdominal pain Patient is nontoxic, nonseptic appearing, in no apparent distress. Patient afebrile, hemodynamically stable. Patient's pain and other symptoms adequately managed in emergency department.  Fluid bolus given.  Labs, imaging and vitals reviewed.   CT abdomen pelvis with impression: No bowel obstruction or abscess. No bowel wall or mesenteric thickening.  There are multiple colonic diverticula without diverticulitis.  Appendix appears normal. No renal or ureteral calculus. No hydronephrosis.   Patient does not meet the SIRS or Sepsis criteria.  On repeat exam patient does not have a surgical abdomin and there are no peritoneal signs.  No indication of appendicitis, bowel obstruction, bowel perforation, cholecystitis, diverticulitis, PID or ectopic pregnancy.  Patient discharged home with symptomatic treatment and given strict instructions for follow-up with their primary care physician.  I have also discussed reasons to return immediately to the ER.  Patient expresses understanding and agrees with plan.  BP 128/80 mmHg  Pulse 96  Temp(Src) 98.2 F (36.8 C) (Oral)  Resp 18  SpO2 97%  Signed,  Dahlia Bailiff, PA-C 12:41 AM  Patient discussed with Dr. Lacretia Leigh, MD      Dahlia Bailiff, PA-C 03/14/15 0041  Lacretia Leigh, MD 03/14/15 2038

## 2015-09-01 ENCOUNTER — Emergency Department (HOSPITAL_COMMUNITY)
Admission: EM | Admit: 2015-09-01 | Discharge: 2015-09-01 | Disposition: A | Discharge status: Home / Self Care | Payer: Medicare HMO | Attending: Emergency Medicine | Admitting: Emergency Medicine

## 2015-09-01 ENCOUNTER — Emergency Department (HOSPITAL_COMMUNITY): Payer: Medicare HMO

## 2015-09-01 ENCOUNTER — Encounter (HOSPITAL_COMMUNITY): Payer: Self-pay

## 2015-09-01 DIAGNOSIS — R079 Chest pain, unspecified: Secondary | ICD-10-CM | POA: Diagnosis not present

## 2015-09-01 DIAGNOSIS — Z79899 Other long term (current) drug therapy: Secondary | ICD-10-CM | POA: Diagnosis not present

## 2015-09-01 DIAGNOSIS — Z7951 Long term (current) use of inhaled steroids: Secondary | ICD-10-CM | POA: Insufficient documentation

## 2015-09-01 DIAGNOSIS — R059 Cough, unspecified: Secondary | ICD-10-CM

## 2015-09-01 DIAGNOSIS — Z880 Allergy status to penicillin: Secondary | ICD-10-CM | POA: Insufficient documentation

## 2015-09-01 DIAGNOSIS — Z8639 Personal history of other endocrine, nutritional and metabolic disease: Secondary | ICD-10-CM | POA: Insufficient documentation

## 2015-09-01 DIAGNOSIS — M797 Fibromyalgia: Secondary | ICD-10-CM | POA: Diagnosis not present

## 2015-09-01 DIAGNOSIS — J45901 Unspecified asthma with (acute) exacerbation: Secondary | ICD-10-CM | POA: Insufficient documentation

## 2015-09-01 DIAGNOSIS — M1990 Unspecified osteoarthritis, unspecified site: Secondary | ICD-10-CM | POA: Insufficient documentation

## 2015-09-01 DIAGNOSIS — R05 Cough: Secondary | ICD-10-CM | POA: Diagnosis not present

## 2015-09-01 LAB — BASIC METABOLIC PANEL
ANION GAP: 13 (ref 5–15)
BUN: 11 mg/dL (ref 6–20)
CALCIUM: 8.8 mg/dL — AB (ref 8.9–10.3)
CHLORIDE: 104 mmol/L (ref 101–111)
CO2: 23 mmol/L (ref 22–32)
CREATININE: 0.82 mg/dL (ref 0.44–1.00)
GFR calc non Af Amer: >60 mL/min (ref >60)
Glucose, Bld: 174 mg/dL — ABNORMAL HIGH (ref 65–99)
Potassium: 3.3 mmol/L — ABNORMAL LOW (ref 3.5–5.1)
SODIUM: 140 mmol/L (ref 135–145)

## 2015-09-01 LAB — I-STAT TROPONIN, ED
TROPONIN I, POC: 0 ng/mL (ref 0.00–0.08)
Troponin i, poc: 0 ng/mL (ref 0.00–0.08)

## 2015-09-01 LAB — CBC
HCT: 35.6 % — ABNORMAL LOW (ref 36.0–46.0)
HEMOGLOBIN: 11.5 g/dL — AB (ref 12.0–15.0)
MCH: 23.4 pg — ABNORMAL LOW (ref 26.0–34.0)
MCHC: 32.3 g/dL (ref 30.0–36.0)
MCV: 72.5 fL — ABNORMAL LOW (ref 78.0–100.0)
PLATELETS: 212 10*3/uL (ref 150–400)
RBC: 4.91 MIL/uL (ref 3.87–5.11)
RDW: 15.6 % — ABNORMAL HIGH (ref 11.5–15.5)
WBC: 7.6 10*3/uL (ref 4.0–10.5)

## 2015-09-01 LAB — D-DIMER, QUANTITATIVE (NOT AT ARMC)

## 2015-09-01 MED ORDER — PROMETHAZINE-DM 6.25-15 MG/5ML PO SYRP: 2.5000 mL | ORAL_SOLUTION | Freq: Four times a day (QID) | ORAL | Status: DC | PRN

## 2015-09-01 NOTE — ED Notes (Addendum)
Cough x 1 week.  Chest pain x 2 days.  No fever.  Shortness of breath

## 2015-09-01 NOTE — ED Notes (Signed)
MD at bedside. 

## 2015-09-01 NOTE — ED Provider Notes (Signed)
CSN: [id number]     Arrival date & time 09/01/15  [id number] History   First MD Initiated Contact with Patient 09/01/15 1104     Chief Complaint  Patient presents with  . Chest Pain  . Cough     (Consider location/radiation/quality/duration/timing/severity/associated sxs/prior Treatment) HPI Comments: 49 year old female with past medical history including asthma and chronic bronchitis, fibromyalgia, arthritis who presents with cough and chest pain. Patient reports a 2 week history of persistent cough despite taking prescription cough suppressant. 2 days ago she began having central chest pain that is constant, radiating down her left arm, worse with deep inspiration, and worse with laying flat. She endorses associated shortness of breath worse with exertion. She denies any fevers or vomiting/diarrhea. She does report runny nose. No family history of early heart disease or blood clots. No personal history of cancer, blood clots, recent travel, or OCP use.  Patient is a 49 y.o. female presenting with chest pain and cough. The history is provided by the patient.  Chest Pain Associated symptoms: cough   Cough Associated symptoms: chest pain     Past Medical History  Diagnosis Date  . Asthma   . Chronic bronchitis ([place])   . Arthritis   . Thyroid disease   . Neuromuscular disorder [facility])     fibromyalgia   Past Surgical History  Procedure Laterality Date  . Abdominal hysterectomy    . Foot surgery  1997   Family History  Problem Relation Age of Onset  . Arthritis Mother    Social History  Substance Use Topics  . Smoking status: Never Smoker   . Smokeless tobacco: None  . Alcohol Use: Yes     Comment: occ   OB History    No data available     Review of Systems  Respiratory: Positive for cough.   Cardiovascular: Positive for chest pain.    10 Systems reviewed and are negative for acute change except as noted in the HPI.   Allergies  Albuterol; Penicillins; and  Voltaren  Home Medications   Prior to Admission medications   Medication Sig Start Date End Date Taking? Authorizing Provider  Black Cohosh 175 MG CAPS Take 280 mg by mouth daily.    Yes Historical Provider, MD  cetirizine (ZYRTEC) 10 MG tablet Take 10 mg by mouth at bedtime.    Yes Historical Provider, MD  Cholecalciferol (VITAMIN D3) 2000 UNITS CHEW Chew 4,000 each by mouth daily.    Yes Historical Provider, MD  conjugated estrogens (PREMARIN) vaginal cream Place 0.5gm vaginally every night x 2 wks then 2 nights per week 08/03/13  Yes Historical Provider, MD  cyclobenzaprine (FLEXERIL) 10 MG tablet Take 10 mg by mouth at bedtime.   Yes Lorenza Burton, FNP  FIBER SELECT GUMMIES CHEW Chew 2 tablets by mouth daily.   Yes Historical Provider, MD  FLUoxetine (PROZAC) 40 MG capsule Take 40 mg by mouth every morning.   Yes Historical Provider, MD  Fluticasone-Salmeterol (ADVAIR) 250-50 MCG/DOSE AEPB Inhale 1 puff into the lungs 2 (two) times daily.   Yes Historical Provider, MD  Glucosamine-Chondroit-Vit C-Mn (GLUCOSAMINE CHONDR 1500 COMPLX) CAPS Take 1 capsule by mouth every morning.   Yes Historical Provider, MD  guaiFENesin-codeine (ROBITUSSIN AC) 100-10 MG/5ML syrup Take 5 mLs by mouth 2 (two) times daily as needed for cough.   Yes Historical Provider, MD  ipratropium (ATROVENT HFA) 17 MCG/ACT inhaler Inhale 2 puffs into the lungs every 6 (six) hours as needed for wheezing.  Yes Angelina Pih, MD  lidocaine (LIDODERM) 5 % Place 1 patch onto the skin daily as needed (pain). Remove & Discard patch within 12 hours or as directed by MD   Yes Historical Provider, MD  montelukast (SINGULAIR) 10 MG tablet Take 10 mg by mouth at bedtime.   Yes Lorenza Burton, FNP  Multiple Vitamins-Minerals (HAIR/SKIN/NAILS PO) Take 2 tablets by mouth every morning.    Yes Historical Provider, MD  Multiple Vitamins-Minerals (MULTIVITAMIN GUMMIES ADULT PO) Take 2 tablets by mouth every morning.   Yes Historical  Provider, MD  pantoprazole (PROTONIX) 40 MG tablet Take 40 mg by mouth. 11/03/14 11/03/15 Yes Historical Provider, MD  Polyethyl Glycol-Propyl Glycol (SYSTANE) 0.4-0.3 % SOLN Place 1 drop into both eyes 3 (three) times daily as needed (dry eyes).    Yes Historical Provider, MD  pregabalin (LYRICA) 150 MG capsule Take 150 mg by mouth daily.   Yes Historical Provider, MD  Probiotic Product (PROBIOTIC PO) Take 2 capsules by mouth daily.   Yes Historical Provider, MD  rizatriptan (MAXALT-MLT) 10 MG disintegrating tablet Take 10 mg by mouth every 2 (two) hours as needed for migraine.  12/09/14  Yes Historical Provider, MD  topiramate (TOPAMAX) 100 MG tablet Take 100 mg by mouth daily.    Yes Lorenza Burton, FNP  promethazine-dextromethorphan (PROMETHAZINE-DM) 6.25-15 MG/5ML syrup Take 2.5 mLs by mouth 4 (four) times daily as needed for cough. 09/01/15   Wenda Overland Kiandre Spagnolo, MD   BP 127/71 mmHg  Pulse 83  Temp(Src) 98.3 F (36.8 C) (Oral)  Resp 18  SpO2 98% Physical Exam  Constitutional: She is oriented to person, place, and time. She appears well-developed and well-nourished. No distress.  HENT:  Head: Normocephalic and atraumatic.  Moist mucous membranes  Eyes: Conjunctivae are normal. Pupils are equal, round, and reactive to light.  Neck: Neck supple.  Cardiovascular: Normal rate, regular rhythm and normal heart sounds.   No murmur heard. Pulmonary/Chest: Effort normal and breath sounds normal.  Chest pain reproducible with palpation of central chest and sternum  Abdominal: Soft. Bowel sounds are normal. She exhibits no distension. There is no tenderness.  Musculoskeletal: She exhibits no edema.  Neurological: She is alert and oriented to person, place, and time.  Fluent speech  Skin: Skin is warm and dry.  Psychiatric: She has a normal mood and affect. Judgment normal.  Nursing note and vitals reviewed.   ED Course  Procedures (including critical care time) Labs Review Labs Reviewed   BASIC METABOLIC PANEL - Abnormal; Notable for the following:    Potassium 3.3 (*)    Glucose, Bld 174 (*)    Calcium 8.8 (*)    All other components within normal limits  CBC - Abnormal; Notable for the following:    Hemoglobin 11.5 (*)    HCT 35.6 (*)    MCV 72.5 (*)    MCH 23.4 (*)    RDW 15.6 (*)    All other components within normal limits  D-DIMER, QUANTITATIVE (NOT AT [facility])  Randolm Idol, ED  Randolm Idol, ED    Imaging Review Dg Chest 2 View  09/01/2015  CLINICAL DATA:  Chest pain and cough EXAM: CHEST  2 VIEW COMPARISON:  03/13/2015 FINDINGS: Normal heart size and mediastinal contours. No acute infiltrate or edema. No effusion or pneumothorax. Thoracolumbar dextroscoliosis and degenerative endplate spurring. No acute osseous findings. IMPRESSION: No active cardiopulmonary disease. Electronically Signed   By: Monte Fantasia M.D.   On: 09/01/2015 10:18   I have  personally reviewed and evaluated these lab results as part of my medical decision-making.   EKG Interpretation   Date/Time:  Thursday September 01 2015 09:30:52 EST Ventricular Rate:  93 PR Interval:  190 QRS Duration: 97 QT Interval:  346 QTC Calculation: 430 R Axis:   51 Text Interpretation:  Sinus rhythm Low voltage, precordial leads Baseline  wander in lead(s) II III aVF V2 V3 No significant change since last  tracing Confirmed by Jenai Scaletta MD, Freddi Schrager [id number]) on 09/01/2015 11:10:31 AM      MDM   Final diagnoses:  Cough  Chest pain, unspecified chest pain type    Patient presents with 2 weeks of cough associated with 2 days of chest pain and shortness of breath on exertion. Patient well-appearing with normal vital signs at presentation. EKG unremarkable without evidence of ischemia or pericarditis. No abnormal lung sounds on exam. Obtained above labs including serial troponins and d-dimer. Chest x-ray unremarkable.   Labwork reassuring with normal serial troponin and negative d-dimer. Given the  chronicity of the cough and the description of the patient's pain, I suspect costochondritis versus pericarditis. Patient remains well-appearing and has no risk factors for early heart disease therefore HEART score is low. I discussed supportive care instructions including short course of NSAIDs. Provided with cough medication. Return precautions reviewed and patient voiced understanding. Patient discharged in satisfactory condition.  Sharlett Iles, MD 09/01/15 [phone]

## 2015-09-01 NOTE — ED Notes (Signed)
MD at bedside. EDP LITTLE PRESENT  

## 2016-01-30 ENCOUNTER — Ambulatory Visit: Payer: Medicare HMO | Admitting: Allergy and Immunology

## 2016-02-13 ENCOUNTER — Ambulatory Visit: Payer: Medicare HMO | Admitting: Allergy and Immunology

## 2017-09-05 ENCOUNTER — Other Ambulatory Visit: Payer: Self-pay | Admitting: Family Medicine

## 2017-09-05 DIAGNOSIS — Z1231 Encounter for screening mammogram for malignant neoplasm of breast: Secondary | ICD-10-CM

## 2017-09-05 DIAGNOSIS — M8580 Other specified disorders of bone density and structure, unspecified site: Secondary | ICD-10-CM

## 2017-10-07 ENCOUNTER — Ambulatory Visit: Payer: Medicare HMO

## 2017-10-07 ENCOUNTER — Inpatient Hospital Stay
Admission: RE | Admit: 2017-10-07 | Discharge: 2017-10-07 | Disposition: A | Discharge status: Home / Self Care | Payer: Medicare HMO | Source: Ambulatory Visit | Attending: Family Medicine | Admitting: Family Medicine

## 2017-10-29 ENCOUNTER — Ambulatory Visit
Admission: RE | Admit: 2017-10-29 | Discharge: 2017-10-29 | Disposition: A | Discharge status: Home / Self Care | Payer: Medicare HMO | Source: Ambulatory Visit | Attending: Family Medicine | Admitting: Family Medicine

## 2017-10-29 DIAGNOSIS — Z1231 Encounter for screening mammogram for malignant neoplasm of breast: Secondary | ICD-10-CM

## 2017-10-29 DIAGNOSIS — M8580 Other specified disorders of bone density and structure, unspecified site: Secondary | ICD-10-CM

## 2017-10-30 ENCOUNTER — Other Ambulatory Visit: Payer: Self-pay | Admitting: Family Medicine

## 2017-10-30 DIAGNOSIS — R928 Other abnormal and inconclusive findings on diagnostic imaging of breast: Secondary | ICD-10-CM

## 2017-11-05 ENCOUNTER — Ambulatory Visit
Admission: RE | Admit: 2017-11-05 | Discharge: 2017-11-05 | Disposition: A | Discharge status: Home / Self Care | Payer: Medicare HMO | Source: Ambulatory Visit | Attending: Family Medicine | Admitting: Family Medicine

## 2017-11-05 ENCOUNTER — Other Ambulatory Visit: Payer: Self-pay | Admitting: Family Medicine

## 2017-11-05 DIAGNOSIS — N6320 Unspecified lump in the left breast, unspecified quadrant: Secondary | ICD-10-CM

## 2017-11-05 DIAGNOSIS — R928 Other abnormal and inconclusive findings on diagnostic imaging of breast: Secondary | ICD-10-CM

## 2017-11-11 ENCOUNTER — Ambulatory Visit
Admission: RE | Admit: 2017-11-11 | Discharge: 2017-11-11 | Disposition: A | Discharge status: Home / Self Care | Payer: Medicare HMO | Source: Ambulatory Visit | Attending: Family Medicine | Admitting: Family Medicine

## 2017-11-11 DIAGNOSIS — N6320 Unspecified lump in the left breast, unspecified quadrant: Secondary | ICD-10-CM

## 2017-12-03 ENCOUNTER — Other Ambulatory Visit: Payer: Self-pay | Admitting: Surgery

## 2017-12-03 DIAGNOSIS — N6320 Unspecified lump in the left breast, unspecified quadrant: Secondary | ICD-10-CM

## 2017-12-20 NOTE — Pre-Procedure Instructions (Signed)
CARLESHA SEIPLE  12/20/2017      [place], [address]. [place] [address] Phone: [phone] Fax: [phone]    Your procedure is scheduled on April 9  Report to [place] at [place].M.  Call this number if you have problems the morning of surgery:  [phone]   Remember:  Do not eat food or drink liquids after midnight.  Continue all medications as directed by your physician except follow these medication instructions before surgery below   Take these medicines the morning of surgery with A SIP OF WATER  albuterol (PROVENTIL) FLUoxetine (PROZAC) fluticasone (FLONASE) Fluticasone-Salmeterol (ADVAIR)  ipratropium (ATROVENT oxybutynin (DITROPAN-XL)  pantoprazole (PROTONIX)  Eye drops if needed  7 days prior to surgery STOP taking any Aspirin(unless otherwise instructed by your surgeon), Aleve, Naproxen, Ibuprofen, Motrin, Advil, Goody's, BC's, all herbal medications, fish oil, and all vitamins     Do not wear jewelry, make-up or nail polish.  Do not wear lotions, powders, or perfumes, or deodorant.  Do not shave 48 hours prior to surgery.    Do not bring valuables to the hospital.  [facility] is not responsible for any belongings or valuables.  Contacts, dentures or bridgework may not be worn into surgery.  Leave your suitcase in the car.  After surgery it may be brought to your room.  For patients admitted to the hospital, discharge time will be determined by your treatment team.  Patients discharged the day of surgery will not be allowed to drive home.    Special instructions:   [facility]- Preparing For Surgery  Before surgery, you can play an important role. Because skin is not sterile, your skin needs to be as free of germs as possible. You can reduce the number of germs on your skin by washing with CHG (chlorahexidine gluconate) Soap before surgery.  CHG is an  antiseptic cleaner which kills germs and bonds with the skin to continue killing germs even after washing.  Please do not use if you have an allergy to CHG or antibacterial soaps. If your skin becomes reddened/irritated stop using the CHG.  Do not shave (including legs and underarms) for at least 48 hours prior to first CHG shower. It is OK to shave your face.  Please follow these instructions carefully.   1. Shower the NIGHT BEFORE SURGERY and the MORNING OF SURGERY with CHG.   2. If you chose to wash your hair, wash your hair first as usual with your normal shampoo.  3. After you shampoo, rinse your hair and body thoroughly to remove the shampoo.  4. Use CHG as you would any other liquid soap. You can apply CHG directly to the skin and wash gently with a scrungie or a clean washcloth.   5. Apply the CHG Soap to your body ONLY FROM THE NECK DOWN.  Do not use on open wounds or open sores. Avoid contact with your eyes, ears, mouth and genitals (private parts). Wash Face and genitals (private parts)  with your normal soap.  6. Wash thoroughly, paying special attention to the area where your surgery will be performed.  7. Thoroughly rinse your body with warm water from the neck down.  8. DO NOT shower/wash with your normal soap after using and rinsing off the CHG Soap.  9. Pat yourself dry with a CLEAN TOWEL.  10. Wear CLEAN PAJAMAS to bed the night before surgery,  wear comfortable clothes the morning of surgery  11. Place CLEAN SHEETS on your bed the night of your first shower and DO NOT SLEEP WITH PETS.    Day of Surgery: Do not apply any deodorants/lotions. Please wear clean clothes to the hospital/surgery center.      Please read over the following fact sheets that you were given.

## 2017-12-23 ENCOUNTER — Encounter (HOSPITAL_COMMUNITY): Payer: Self-pay

## 2017-12-23 ENCOUNTER — Encounter (HOSPITAL_COMMUNITY)
Admission: RE | Admit: 2017-12-23 | Discharge: 2017-12-23 | Disposition: A | Discharge status: Home / Self Care | Payer: Medicare HMO | Source: Ambulatory Visit | Attending: Surgery | Admitting: Surgery

## 2017-12-23 ENCOUNTER — Other Ambulatory Visit: Payer: Self-pay

## 2017-12-23 DIAGNOSIS — Z01812 Encounter for preprocedural laboratory examination: Secondary | ICD-10-CM | POA: Insufficient documentation

## 2017-12-23 HISTORY — DX: Personal history of other diseases of urinary system: Z87.448

## 2017-12-23 HISTORY — DX: Gastro-esophageal reflux disease without esophagitis: K21.9

## 2017-12-23 HISTORY — DX: Headache, unspecified: R51.9

## 2017-12-23 HISTORY — DX: Carpal tunnel syndrome, unspecified upper limb: G56.00

## 2017-12-23 HISTORY — DX: Anxiety disorder, unspecified: F41.9

## 2017-12-23 HISTORY — DX: Fibromyalgia: M79.7

## 2017-12-23 HISTORY — DX: Chronic kidney disease, unspecified: N18.9

## 2017-12-23 HISTORY — DX: Headache: R51

## 2017-12-23 HISTORY — DX: Personal history of other specified conditions: Z87.898

## 2017-12-23 LAB — CBC
HEMATOCRIT: 38.6 % (ref 36.0–46.0)
HEMOGLOBIN: 11.8 g/dL — AB (ref 12.0–15.0)
MCH: 22.5 pg — ABNORMAL LOW (ref 26.0–34.0)
MCHC: 30.6 g/dL (ref 30.0–36.0)
MCV: 73.5 fL — ABNORMAL LOW (ref 78.0–100.0)
Platelets: 236 10*3/uL (ref 150–400)
RBC: 5.25 MIL/uL — ABNORMAL HIGH (ref 3.87–5.11)
RDW: 16.2 % — ABNORMAL HIGH (ref 11.5–15.5)
WBC: 7 10*3/uL (ref 4.0–10.5)

## 2017-12-23 NOTE — Progress Notes (Signed)
PCP  Bernerd Limbo MD     No history of cardiac problems ,never been seen by cardiologist. Denies any cardiac testing

## 2017-12-30 ENCOUNTER — Ambulatory Visit
Admission: RE | Admit: 2017-12-30 | Discharge: 2017-12-30 | Disposition: A | Discharge status: Home / Self Care | Payer: Medicare HMO | Source: Ambulatory Visit | Attending: Surgery | Admitting: Surgery

## 2017-12-30 DIAGNOSIS — N6320 Unspecified lump in the left breast, unspecified quadrant: Secondary | ICD-10-CM

## 2017-12-30 NOTE — Anesthesia Preprocedure Evaluation (Addendum)
Anesthesia Evaluation  Patient identified by MRN, date of birth, ID band Patient awake    Reviewed: Allergy & Precautions, NPO status , Patient's Chart, lab work & pertinent test results  Airway Mallampati: II  TM Distance: >3 FB Neck ROM: Full    Dental  (+) Teeth Intact, Dental Advisory Given   Pulmonary asthma ,    Pulmonary exam normal breath sounds clear to auscultation       Cardiovascular Exercise Tolerance: Good negative cardio ROS Normal cardiovascular exam Rhythm:Regular Rate:Normal     Neuro/Psych  Headaches, PSYCHIATRIC DISORDERS Anxiety  Neuromuscular disease    GI/Hepatic Neg liver ROS, GERD  Medicated and Controlled,  Endo/Other  Obesity   Renal/GU Renal InsufficiencyRenal disease     Musculoskeletal  (+) Arthritis , Fibromyalgia -  Abdominal   Peds  Hematology negative hematology ROS (+)   Anesthesia Other Findings Day of surgery medications reviewed with the patient.  Reproductive/Obstetrics                            Anesthesia Physical Anesthesia Plan  ASA: III  Anesthesia Plan: General   Post-op Pain Management:    Induction: Intravenous  PONV Risk Score and Plan: 3 and Midazolam, Dexamethasone and Ondansetron  Airway Management Planned: LMA  Additional Equipment:   Intra-op Plan:   Post-operative Plan: Extubation in OR  Informed Consent: I have reviewed the patients History and Physical, chart, labs and discussed the procedure including the risks, benefits and alternatives for the proposed anesthesia with the patient or authorized representative who has indicated his/her understanding and acceptance.   Dental advisory given  Plan Discussed with: CRNA  Anesthesia Plan Comments:        Anesthesia Quick Evaluation

## 2017-12-30 NOTE — H&P (Signed)
Christine Bradley Documented: 12/03/2017 9:29 AM Location: [facility] Patient #: [id number] DOB: 1966/08/21 Single / Language: Cleophus Molt / Race: Black or African American Female   History of Present Illness (Nikaela Coyne A. Ninfa Linden MD; 12/03/2017 9:46 AM) The patient is a 52 year old female who presents with a breast mass. This is a pleasant patient referred by Dr. Dorise Bullion for the evaluation of a newly diagnosed left breast mass. She had a small breast mass seen on screening mammography. Ultrasound showed a mass in the left breast measuring approximate 1.4 cm. A stereotactic biopsy was performed showing a biphasic lesion favoring either a potential fibroadenoma or phyllodes tumor. Surgical excision has been recommended for histologic evaluation to rule out malignancy. She has had no previous problems regarding her breast. She has no nipple discharge. She reports occasional chronic pain in the left breast. There is no family history of breast cancer. She is otherwise healthy without complaints.   Past Surgical History Levonne Spiller, CMA; 12/03/2017 9:29 AM) Breast Biopsy  Left. Foot Surgery  Bilateral. Hysterectomy (not due to cancer) - Complete  Knee Surgery  Left.  Diagnostic Studies History Levonne Spiller, CMA; 12/03/2017 9:29 AM) Colonoscopy  1-5 years ago Mammogram  within last year Pap Smear  >5 years ago  Allergies Levonne Spiller, [place]; 12/03/2017 9:30 AM) Penicillins  Allergies Reconciled   Medication History (Danielle Gerrigner, CMA; 12/03/2017 9:34 AM) Naproxen (500MG  Tablet, Oral) Active. Guaifenesin-Codeine (100-10MG /5ML Solution, Oral) Active. Probiotic (Oral) Active. Fiber Adult Gummies (Oral) Specific strength unknown - Active. Lidocaine (5% Patch, External) Active. Lyrica (150MG  Capsule, Oral) Active. ZyrTEC Allergy (10MG  Tablet, Oral) Active. PROzac (40MG  Capsule, Oral) Active. Fluticasone Furoate (27.5MCG/SPRAY  Suspension, Nasal) Active. Glucosamine Chondroitin Adv (Oral) Specific strength unknown - Active. Multiple Vitamin (Oral) Active. Vitamin D (Cholecalciferol) (1000UNIT Capsule, Oral) Active. Cyclobenzaprine HCl (10MG  Tablet, Oral) Active. Ipratropium Bromide (0.02% Solution, Inhalation) Active. Medications Reconciled  Social History Andee Poles Education officer, [employer], CMA; 12/03/2017 9:30 AM) Alcohol use  Occasional alcohol use. No caffeine use  No drug use  Tobacco use  Never smoker.  Family History Levonne Spiller, CMA; 12/03/2017 9:29 AM) Kidney Disease  Brother.  Pregnancy / Birth History Levonne Spiller, [place]; 12/03/2017 9:30 AM) Age at menarche  89 years. Gravida  5 Maternal age  76-25 Para  3  Other Problems Levonne Spiller, CMA; 12/03/2017 9:29 AM) Anxiety Disorder  Arthritis  Asthma  Back Pain  Bladder Problems  Gastroesophageal Reflux Disease  Hypercholesterolemia  Oophorectomy  Bilateral. Thyroid Disease     Review of Systems Andee Poles Gerrigner CMA; 12/03/2017 9:30 AM) General Present- Fatigue, Night Sweats and Weight Gain. Not Present- Appetite Loss, Chills, Fever and Weight Loss. HEENT Present- Seasonal Allergies and Wears glasses/contact lenses. Not Present- Earache, Hearing Loss, Hoarseness, Nose Bleed, Oral Ulcers, Ringing in the Ears, Sinus Pain, Sore Throat, Visual Disturbances and Yellow Eyes. Respiratory Present- Chronic Cough and Snoring. Not Present- Bloody sputum, Difficulty Breathing and Wheezing. Breast Present- Breast Mass. Not Present- Breast Pain, Nipple Discharge and Skin Changes. Cardiovascular Not Present- Chest Pain, Difficulty Breathing Lying Down, Leg Cramps, Palpitations, Rapid Heart Rate, Shortness of Breath and Swelling of Extremities. Gastrointestinal Present- Bloating. Not Present- Abdominal Pain, Bloody Stool, Change in Bowel Habits, Chronic diarrhea, Constipation, Difficulty Swallowing, Excessive gas, Gets full quickly  at meals, Hemorrhoids, Indigestion, Nausea, Rectal Pain and Vomiting. Female Genitourinary Present- Frequency and Urgency. Not Present- Nocturia, Painful Urination and Pelvic Pain. Musculoskeletal Present- Back Pain, Joint Pain and Joint Stiffness. Not Present- Muscle Pain, Muscle Weakness and  Swelling of Extremities. Neurological Present- Headaches. Not Present- Decreased Memory, Fainting, Numbness, Seizures, Tingling, Tremor, Trouble walking and Weakness. Psychiatric Present- Anxiety and Change in Sleep Pattern. Not Present- Bipolar, Depression, Fearful and Frequent crying. Endocrine Present- Hot flashes. Not Present- Cold Intolerance, Excessive Hunger, Hair Changes, Heat Intolerance and New Diabetes. Hematology Not Present- Blood Thinners, Easy Bruising, Excessive bleeding, Gland problems, HIV and Persistent Infections.  Vitals Andee Poles Gerrigner CMA; 12/03/2017 9:35 AM) 12/03/2017 9:34 AM Weight: 208.5 lb Height: 64in Body Surface Area: 1.99 m Body Mass Index: 35.79 kg/m  Temp.: 97.2F(Oral)  Pulse: 123 (Regular)  BP: 120/88 (Sitting, Right Arm, Large)       Physical Exam (Jerris Keltz A. Ninfa Linden MD; 12/03/2017 9:47 AM) General Mental Status-Alert. General Appearance-Consistent with stated age. Hydration-Well hydrated. Voice-Normal.  Head and Neck Head-normocephalic, atraumatic with no lesions or palpable masses. Trachea-midline. Thyroid Gland Characteristics - normal size and consistency.  Eye Eyeball - Bilateral-Extraocular movements intact. Sclera/Conjunctiva - Bilateral-No scleral icterus.  Chest and Lung Exam Chest and lung exam reveals -quiet, even and easy respiratory effort with no use of accessory muscles and on auscultation, normal breath sounds, no adventitious sounds and normal vocal resonance. Inspection Chest Wall - Normal. Back - normal.  Breast Breast - Left-Symmetric, Non Tender, No Biopsy scars, no Dimpling, No  Inflammation, No Lumpectomy scars, No Mastectomy scars, No Peau d' Orange. Breast - Right-Symmetric, Non Tender, No Biopsy scars, no Dimpling, No Inflammation, No Lumpectomy scars, No Mastectomy scars, No Peau d' Orange. Breast Lump-No Palpable Breast Mass. Note: The biopsy incision is well-healed in the left breast. I can palpate no mass   Cardiovascular Cardiovascular examination reveals -normal heart sounds, regular rate and rhythm with no murmurs and normal pedal pulses bilaterally.  Abdomen Inspection Inspection of the abdomen reveals - No Hernias. Skin - Scar - no surgical scars. Palpation/Percussion Palpation and Percussion of the abdomen reveal - Soft, Non Tender, No Rebound tenderness, No Rigidity (guarding) and No hepatosplenomegaly. Auscultation Auscultation of the abdomen reveals - Bowel sounds normal.  Neurologic Neurologic evaluation reveals -alert and oriented x 3 with no impairment of recent or remote memory. Mental Status-Normal.  Musculoskeletal Normal Exam - Left-Upper Extremity Strength Normal and Lower Extremity Strength Normal. Normal Exam - Right-Upper Extremity Strength Normal and Lower Extremity Strength Normal.  Lymphatic Head & Neck  General Head & Neck Lymphatics: Bilateral - Description - Normal. Axillary  General Axillary Region: Bilateral - Description - Normal. Tenderness - Non Tender. Femoral & Inguinal - Did not examine.    Assessment & Plan (Trevion Hoben A. Ninfa Linden MD; 12/03/2017 9:48 AM) LEFT BREAST MASS (N63.20) Impression: This is a patient with a biphasic left breast mass which either represents a possible fibroadenoma or other tumor. Surgical excision of the mass has been recommended for histologic evaluation drop related to see. I have discussed this with her in detail. I gave her a copy of the pathology results and reviewed her mammograms and ultrasound. We discussed forming a radioactive seed guided left breast lumpectomy. I  discussed the surgical procedure in detail as well as recovery. I discussed the risk of surgery which includes but is not limited to bleeding, infection, injury to surrounding structures, the need for further procedures, cardiopulmonary issues, postoperative recovery, etc. She understands and wishes to proceed with surgery which will be scheduled

## 2017-12-31 ENCOUNTER — Encounter (HOSPITAL_COMMUNITY): Payer: Self-pay | Admitting: *Deleted

## 2017-12-31 ENCOUNTER — Ambulatory Visit (HOSPITAL_COMMUNITY): Payer: Medicare HMO | Admitting: Anesthesiology

## 2017-12-31 ENCOUNTER — Other Ambulatory Visit: Payer: Self-pay

## 2017-12-31 ENCOUNTER — Encounter (HOSPITAL_COMMUNITY)
Admission: RE | Disposition: A | Discharge status: Home / Self Care | Payer: Self-pay | Source: Ambulatory Visit | Attending: Surgery

## 2017-12-31 ENCOUNTER — Ambulatory Visit
Admission: RE | Admit: 2017-12-31 | Discharge: 2017-12-31 | Disposition: A | Discharge status: Home / Self Care | Payer: Medicare HMO | Source: Ambulatory Visit | Attending: Surgery | Admitting: Surgery

## 2017-12-31 ENCOUNTER — Ambulatory Visit (HOSPITAL_COMMUNITY)
Admission: RE | Admit: 2017-12-31 | Discharge: 2017-12-31 | Disposition: A | Discharge status: Home / Self Care | Payer: Medicare HMO | Source: Ambulatory Visit | Attending: Surgery | Admitting: Surgery

## 2017-12-31 DIAGNOSIS — Z880 Allergy status to penicillin: Secondary | ICD-10-CM | POA: Insufficient documentation

## 2017-12-31 DIAGNOSIS — J45909 Unspecified asthma, uncomplicated: Secondary | ICD-10-CM | POA: Insufficient documentation

## 2017-12-31 DIAGNOSIS — N6320 Unspecified lump in the left breast, unspecified quadrant: Secondary | ICD-10-CM

## 2017-12-31 DIAGNOSIS — N6022 Fibroadenosis of left breast: Secondary | ICD-10-CM | POA: Diagnosis not present

## 2017-12-31 DIAGNOSIS — G709 Myoneural disorder, unspecified: Secondary | ICD-10-CM | POA: Diagnosis not present

## 2017-12-31 DIAGNOSIS — Z79899 Other long term (current) drug therapy: Secondary | ICD-10-CM | POA: Insufficient documentation

## 2017-12-31 DIAGNOSIS — Z6835 Body mass index (BMI) 35.0-35.9, adult: Secondary | ICD-10-CM | POA: Diagnosis not present

## 2017-12-31 DIAGNOSIS — F419 Anxiety disorder, unspecified: Secondary | ICD-10-CM | POA: Diagnosis not present

## 2017-12-31 DIAGNOSIS — E669 Obesity, unspecified: Secondary | ICD-10-CM | POA: Diagnosis not present

## 2017-12-31 DIAGNOSIS — E7800 Pure hypercholesterolemia, unspecified: Secondary | ICD-10-CM | POA: Insufficient documentation

## 2017-12-31 DIAGNOSIS — M797 Fibromyalgia: Secondary | ICD-10-CM | POA: Diagnosis not present

## 2017-12-31 DIAGNOSIS — N6489 Other specified disorders of breast: Secondary | ICD-10-CM | POA: Diagnosis not present

## 2017-12-31 HISTORY — PX: BREAST EXCISIONAL BIOPSY: SUR124

## 2017-12-31 HISTORY — PX: RADIOACTIVE SEED GUIDED EXCISIONAL BREAST BIOPSY: SHX6490

## 2017-12-31 SURGERY — RADIOACTIVE SEED GUIDED BREAST BIOPSY
Anesthesia: General | Site: Breast | Laterality: Left

## 2017-12-31 MED ORDER — ACETAMINOPHEN 500 MG PO TABS
1000.0000 mg | ORAL_TABLET | ORAL | Status: AC
  Administered 2017-12-31: 1000 mg via ORAL

## 2017-12-31 MED ORDER — DEXAMETHASONE SODIUM PHOSPHATE 10 MG/ML IJ SOLN
INTRAMUSCULAR | Status: DC | PRN
  Administered 2017-12-31: 10 mg via INTRAVENOUS

## 2017-12-31 MED ORDER — CIPROFLOXACIN IN D5W 400 MG/200ML IV SOLN
400.0000 mg | INTRAVENOUS | Status: AC
  Administered 2017-12-31: 400 mg via INTRAVENOUS

## 2017-12-31 MED ORDER — BUPIVACAINE HCL (PF) 0.25 % IJ SOLN
INTRAMUSCULAR | Status: AC
  Filled 2017-12-31: qty 30

## 2017-12-31 MED ORDER — LACTATED RINGERS IV SOLN
INTRAVENOUS | Status: DC | PRN
  Administered 2017-12-31: 07:00:00 via INTRAVENOUS

## 2017-12-31 MED ORDER — ACETAMINOPHEN 500 MG PO TABS
ORAL_TABLET | ORAL | Status: AC
  Filled 2017-12-31: qty 2

## 2017-12-31 MED ORDER — LIDOCAINE 2% (20 MG/ML) 5 ML SYRINGE
INTRAMUSCULAR | Status: AC
  Filled 2017-12-31: qty 5

## 2017-12-31 MED ORDER — MIDAZOLAM HCL 2 MG/2ML IJ SOLN
INTRAMUSCULAR | Status: AC
  Filled 2017-12-31: qty 2

## 2017-12-31 MED ORDER — CIPROFLOXACIN IN D5W 400 MG/200ML IV SOLN
INTRAVENOUS | Status: AC
  Filled 2017-12-31: qty 200

## 2017-12-31 MED ORDER — LIDOCAINE 2% (20 MG/ML) 5 ML SYRINGE
INTRAMUSCULAR | Status: DC | PRN
  Administered 2017-12-31: 60 mg via INTRAVENOUS

## 2017-12-31 MED ORDER — GABAPENTIN 300 MG PO CAPS
300.0000 mg | ORAL_CAPSULE | ORAL | Status: AC
  Administered 2017-12-31: 300 mg via ORAL

## 2017-12-31 MED ORDER — BUPIVACAINE HCL (PF) 0.25 % IJ SOLN
INTRAMUSCULAR | Status: DC | PRN
  Administered 2017-12-31: 30 mL

## 2017-12-31 MED ORDER — PROPOFOL 10 MG/ML IV BOLUS
INTRAVENOUS | Status: AC
Start: 2017-12-31 — End: ?
  Filled 2017-12-31: qty 20

## 2017-12-31 MED ORDER — MIDAZOLAM HCL 2 MG/2ML IJ SOLN
INTRAMUSCULAR | Status: DC | PRN
  Administered 2017-12-31: 2 mg via INTRAVENOUS

## 2017-12-31 MED ORDER — CHLORHEXIDINE GLUCONATE CLOTH 2 % EX PADS: 6.0000 | MEDICATED_PAD | Freq: Once | CUTANEOUS | Status: DC

## 2017-12-31 MED ORDER — PROPOFOL 10 MG/ML IV BOLUS
INTRAVENOUS | Status: AC
  Filled 2017-12-31: qty 20

## 2017-12-31 MED ORDER — ONDANSETRON HCL 4 MG/2ML IJ SOLN
INTRAMUSCULAR | Status: AC
  Filled 2017-12-31: qty 2

## 2017-12-31 MED ORDER — OXYCODONE HCL 5 MG PO TABS
ORAL_TABLET | ORAL | Status: AC
  Filled 2017-12-31: qty 1

## 2017-12-31 MED ORDER — OXYCODONE HCL 5 MG PO TABS
5.0000 mg | ORAL_TABLET | Freq: Once | ORAL | Status: AC
  Administered 2017-12-31: 5 mg via ORAL

## 2017-12-31 MED ORDER — FENTANYL CITRATE (PF) 250 MCG/5ML IJ SOLN
INTRAMUSCULAR | Status: DC | PRN
  Administered 2017-12-31: 50 ug via INTRAVENOUS

## 2017-12-31 MED ORDER — ONDANSETRON HCL 4 MG/2ML IJ SOLN
INTRAMUSCULAR | Status: DC | PRN
  Administered 2017-12-31: 4 mg via INTRAVENOUS

## 2017-12-31 MED ORDER — FENTANYL CITRATE (PF) 250 MCG/5ML IJ SOLN
INTRAMUSCULAR | Status: AC
  Filled 2017-12-31: qty 5

## 2017-12-31 MED ORDER — FENTANYL CITRATE (PF) 100 MCG/2ML IJ SOLN: 25.0000 ug | INTRAMUSCULAR | Status: DC | PRN

## 2017-12-31 MED ORDER — PROPOFOL 10 MG/ML IV BOLUS
INTRAVENOUS | Status: DC | PRN
  Administered 2017-12-31: 150 mg via INTRAVENOUS
  Administered 2017-12-31: 50 mg via INTRAVENOUS

## 2017-12-31 MED ORDER — OXYCODONE HCL 5 MG PO TABS: 5.0000 mg | ORAL_TABLET | Freq: Four times a day (QID) | ORAL | 0 refills | Status: DC | PRN

## 2017-12-31 MED ORDER — PROMETHAZINE HCL 25 MG/ML IJ SOLN: 6.2500 mg | INTRAMUSCULAR | Status: DC | PRN

## 2017-12-31 MED ORDER — GABAPENTIN 300 MG PO CAPS
ORAL_CAPSULE | ORAL | Status: AC
  Filled 2017-12-31: qty 1

## 2017-12-31 MED ORDER — 0.9 % SODIUM CHLORIDE (POUR BTL) OPTIME
TOPICAL | Status: DC | PRN
  Administered 2017-12-31: 1000 mL

## 2017-12-31 SURGICAL SUPPLY — 52 items
ADH SKN CLS APL DERMABOND .7 (GAUZE/BANDAGES/DRESSINGS) ×1
APPLIER CLIP 9.375 MED OPEN (MISCELLANEOUS) ×3
APR CLP MED 9.3 20 MLT OPN (MISCELLANEOUS) ×1
BINDER BREAST LRG (GAUZE/BANDAGES/DRESSINGS) IMPLANT
BINDER BREAST XLRG (GAUZE/BANDAGES/DRESSINGS) IMPLANT
BLADE SURG 15 STRL LF DISP TIS (BLADE) ×1 IMPLANT
BLADE SURG 15 STRL SS (BLADE) ×3
CANISTER SUCT 3000ML PPV (MISCELLANEOUS) IMPLANT
CHLORAPREP W/TINT 26ML (MISCELLANEOUS) ×3 IMPLANT
CLIP APPLIE 9.375 MED OPEN (MISCELLANEOUS) IMPLANT
COVER PROBE W GEL 5X96 (DRAPES) ×3 IMPLANT
COVER SURGICAL LIGHT HANDLE (MISCELLANEOUS) ×3 IMPLANT
DERMABOND ADVANCED (GAUZE/BANDAGES/DRESSINGS) ×2
DERMABOND ADVANCED .7 DNX12 (GAUZE/BANDAGES/DRESSINGS) IMPLANT
DEVICE DUBIN SPECIMEN MAMMOGRA (MISCELLANEOUS) ×3 IMPLANT
DRAPE CHEST BREAST 15X10 FENES (DRAPES) ×3 IMPLANT
DRAPE UTILITY XL STRL (DRAPES) ×3 IMPLANT
ELECT CAUTERY BLADE 6.4 (BLADE) ×3 IMPLANT
ELECT REM PT RETURN 9FT ADLT (ELECTROSURGICAL) ×3
ELECTRODE REM PT RTRN 9FT ADLT (ELECTROSURGICAL) ×1 IMPLANT
GLOVE BIOGEL PI IND STRL 6.5 (GLOVE) IMPLANT
GLOVE BIOGEL PI IND STRL 7.0 (GLOVE) IMPLANT
GLOVE BIOGEL PI INDICATOR 6.5 (GLOVE) ×2
GLOVE BIOGEL PI INDICATOR 7.0 (GLOVE) ×2
GLOVE SURG SIGNA 7.5 PF LTX (GLOVE) ×3 IMPLANT
GLOVE SURG SS PI 6.0 STRL IVOR (GLOVE) ×2 IMPLANT
GLOVE SURG SS PI 6.5 STRL IVOR (GLOVE) ×2 IMPLANT
GOWN STRL REUS W/ TWL LRG LVL3 (GOWN DISPOSABLE) ×1 IMPLANT
GOWN STRL REUS W/ TWL XL LVL3 (GOWN DISPOSABLE) ×1 IMPLANT
GOWN STRL REUS W/TWL LRG LVL3 (GOWN DISPOSABLE) ×6
GOWN STRL REUS W/TWL XL LVL3 (GOWN DISPOSABLE) ×3
KIT BASIN OR (CUSTOM PROCEDURE TRAY) ×3 IMPLANT
KIT MARKER MARGIN INK (KITS) ×3 IMPLANT
NDL HYPO 25GX1X1/2 BEV (NEEDLE) ×1 IMPLANT
NEEDLE HYPO 25GX1X1/2 BEV (NEEDLE) ×3 IMPLANT
NS IRRIG 1000ML POUR BTL (IV SOLUTION) ×2 IMPLANT
PACK SURGICAL SETUP 50X90 (CUSTOM PROCEDURE TRAY) ×3 IMPLANT
PENCIL BUTTON HOLSTER BLD 10FT (ELECTRODE) ×3 IMPLANT
SPONGE LAP 18X18 X RAY DECT (DISPOSABLE) ×3 IMPLANT
SUT MNCRL AB 4-0 PS2 18 (SUTURE) ×2 IMPLANT
SUT SILK 2 0 SH (SUTURE) IMPLANT
SUT VIC AB 2-0 SH 27 (SUTURE) ×3
SUT VIC AB 2-0 SH 27XBRD (SUTURE) ×1 IMPLANT
SUT VIC AB 3-0 SH 27 (SUTURE) ×3
SUT VIC AB 3-0 SH 27X BRD (SUTURE) ×1 IMPLANT
SYR BULB 3OZ (MISCELLANEOUS) ×1 IMPLANT
SYR CONTROL 10ML LL (SYRINGE) ×3 IMPLANT
TOWEL OR 17X24 6PK STRL BLUE (TOWEL DISPOSABLE) ×3 IMPLANT
TOWEL OR 17X26 10 PK STRL BLUE (TOWEL DISPOSABLE) ×3 IMPLANT
TUBE CONNECTING 12'X1/4 (SUCTIONS) ×1
TUBE CONNECTING 12X1/4 (SUCTIONS) ×1 IMPLANT
YANKAUER SUCT BULB TIP NO VENT (SUCTIONS) IMPLANT

## 2017-12-31 NOTE — Progress Notes (Signed)
Report given to ronda hunt rn as caregiver 

## 2017-12-31 NOTE — Interval H&P Note (Signed)
History and Physical Interval Note: no change in H and P  12/31/2017 7:05 AM  Christine Bradley  has presented today for surgery, with the diagnosis of LEFT BREAST MASS  The various methods of treatment have been discussed with the patient and family. After consideration of risks, benefits and other options for treatment, the patient has consented to  Procedure(s): LEFT BREAST SEED GUIDED EXCISIONAL BIOPSY (Left) as a surgical intervention .  The patient's history has been reviewed, patient examined, no change in status, stable for surgery.  I have reviewed the patient's chart and labs.  Questions were answered to the patient's satisfaction.     [name] A

## 2017-12-31 NOTE — Anesthesia Procedure Notes (Signed)
Procedure Name: LMA Insertion Date/Time: 12/31/2017 7:37 AM Performed by: Harden Mo, CRNA Pre-anesthesia Checklist: Patient identified, Emergency Drugs available, Suction available and Patient being monitored Patient Re-evaluated:Patient Re-evaluated prior to induction Oxygen Delivery Method: Circle System Utilized Preoxygenation: Pre-oxygenation with 100% oxygen Induction Type: IV induction Ventilation: Mask ventilation without difficulty LMA: LMA inserted LMA Size: 4.0 Number of attempts: 1 Airway Equipment and Method: Bite block Placement Confirmation: positive ETCO2 Tube secured with: Tape Dental Injury: Teeth and Oropharynx as per pre-operative assessment

## 2017-12-31 NOTE — Discharge Instructions (Signed)
[place] Office Phone Number [phone]  BREAST BIOPSY/ PARTIAL MASTECTOMY: POST OP INSTRUCTIONS  Always review your discharge instruction sheet given to you by the facility where your surgery was performed.  IF YOU HAVE DISABILITY OR FAMILY LEAVE FORMS, YOU MUST BRING THEM TO THE OFFICE FOR PROCESSING.  DO NOT GIVE THEM TO YOUR DOCTOR.  1. A prescription for pain medication may be given to you upon discharge.  Take your pain medication as prescribed, if needed.  If narcotic pain medicine is not needed, then you may take acetaminophen (Tylenol) or ibuprofen (Advil) as needed. 2. Take your usually prescribed medications unless otherwise directed 3. If you need a refill on your pain medication, please contact your pharmacy.  They will contact our office to request authorization.  Prescriptions will not be filled after 5pm or on week-ends. 4. You should eat very light the first 24 hours after surgery, such as soup, crackers, pudding, etc.  Resume your normal diet the day after surgery. 5. Most patients will experience some swelling and bruising in the breast.  Ice packs and a good support bra will help.  Swelling and bruising can take several days to resolve.  6. It is common to experience some constipation if taking pain medication after surgery.  Increasing fluid intake and taking a stool softener will usually help or prevent this problem from occurring.  A mild laxative (Milk of Magnesia or Miralax) should be taken according to package directions if there are no bowel movements after 48 hours. 7. Unless discharge instructions indicate otherwise, you may remove your bandages 24-48 hours after surgery, and you may shower at that time.  You may have steri-strips (small skin tapes) in place directly over the incision.  These strips should be left on the skin for 7-10 days.  If your surgeon used skin glue on the incision, you may shower in 24 hours.  The glue will flake off over the  next 2-3 weeks.  Any sutures or staples will be removed at the office during your follow-up visit. 8. ACTIVITIES:  You may resume regular daily activities (gradually increasing) beginning the next day.  Wearing a good support bra or sports bra minimizes pain and swelling.  You may have sexual intercourse when it is comfortable. a. You may drive when you no longer are taking prescription pain medication, you can comfortably wear a seatbelt, and you can safely maneuver your car and apply brakes. b. RETURN TO WORK:  ______________________________________________________________________________________ 9. You should see your doctor in the office for a follow-up appointment approximately two weeks after your surgery.  Your doctors nurse will typically make your follow-up appointment when she calls you with your pathology report.  Expect your pathology report 2-3 business days after your surgery.  You may call to check if you do not hear from [place] after three days. 10. OTHER INSTRUCTIONS: _OK TO SHOWER TOMORROW 11. ICE PACK, IBUPROFEN, TYLENOL ALSO PAIN. 12. ______________________________________________________________________________________________ _____________________________________________________________________________________________________________________________________ _____________________________________________________________________________________________________________________________________ _____________________________________________________________________________________________________________________________________  WHEN TO CALL YOUR DOCTOR: 1. Fever over 101.0 2. Nausea and/or vomiting. 3. Extreme swelling or bruising. 4. Continued bleeding from incision. 5. Increased pain, redness, or drainage from the incision.  The clinic staff is available to answer your questions during regular business hours.  Please dont hesitate to call and ask to speak to one of the nurses for  clinical concerns.  If you have a medical emergency, go to the nearest emergency room or call 911.  A surgeon from [facility] is always on  call at the hospital.  For further questions, please visit [email or web address]

## 2017-12-31 NOTE — Transfer of Care (Signed)
Immediate Anesthesia Transfer of Care Note  Patient: Christine Bradley  Procedure(s) Performed: LEFT BREAST SEED GUIDED EXCISIONAL BIOPSY (Left Breast)  Patient Location: PACU  Anesthesia Type:General  Level of Consciousness: awake, alert  and oriented  Airway & Oxygen Therapy: Patient Spontanous Breathing and Patient connected to face mask oxygen  Post-op Assessment: Report given to RN and Post -op Vital signs reviewed and stable  Post vital signs: Reviewed and stable  Last Vitals:  Vitals Value Taken Time  BP 159/87 12/31/2017  8:12 AM  Temp    Pulse 117 12/31/2017  8:13 AM  Resp 15 12/31/2017  8:13 AM  SpO2 100 % 12/31/2017  8:13 AM  Vitals shown include unvalidated device data.  Last Pain:  Vitals:   12/31/17 0631  TempSrc:   PainSc: 8          Complications: No apparent anesthesia complications

## 2017-12-31 NOTE — Anesthesia Postprocedure Evaluation (Signed)
Anesthesia Post Note  Patient: Christine Bradley  Procedure(s) Performed: LEFT BREAST SEED GUIDED EXCISIONAL BIOPSY (Left Breast)     Patient location during evaluation: PACU Anesthesia Type: General Level of consciousness: awake and alert Pain management: pain level controlled Vital Signs Assessment: post-procedure vital signs reviewed and stable Respiratory status: spontaneous breathing, nonlabored ventilation and respiratory function stable Cardiovascular status: blood pressure returned to baseline and stable Postop Assessment: no apparent nausea or vomiting Anesthetic complications: no    Last Vitals:  Vitals:   12/31/17 0851 12/31/17 0939  BP: 125/90 130/90  Pulse:  76  Resp:  18  Temp: (!) 36.4 C   SpO2:  97%    Last Pain:  Vitals:   12/31/17 0851  TempSrc:   PainSc: 0-No pain                 Catalina Gravel

## 2017-12-31 NOTE — Op Note (Addendum)
   Christine Bradley 12/31/2017   Pre-op Diagnosis: LEFT BREAST MASS     Post-op Diagnosis: same  Procedure(s): LEFT BREAST RADIOACTIVE SEED GUIDED LUMPECTOMY  Surgeon(s): Coralie Keens, MD  Anesthesia: General  Staff:  Circulator: Beryle Lathe, RN Scrub Person: Zannie Kehr Circulator Assistant: Nicholos Johns, RN  Estimated Blood Loss: Minimal               Specimens: sent to path  Indications: This is a 52 year old female who was found to have a left breast mass on screening mammography.  Biopsy performed stereotactically by radiology favored a fibroadenoma.  After discussion with the patient, she was to have removed in the operating room  Procedure: The patient was brought to the operating room and identified as the correct patient.  She was placed supine on the operating room table and general anesthesia was induced.  Her left breast was then prepped and draped in the usual sterile fashion.  Using the neoprobe, I identified an area at the edge of the areola near the upper outer quadrant of the left breast.  I anesthetized the skin around the areola with Marcaine.  I then made an incision with a scalpel.  I dissected down into the breast tissue with electrocautery.  The mass itself was palpable.  Using the neoprobe, I excised the mass in the radioactive seed.  Once the specimen was removed, I marked the margins with marker paint.  X-ray was performed confirming that the radioactive seed and previous marker were in the specimen.  It was sent to pathology for evaluation.  I achieved hemostasis with the cautery.  I anesthetized the wound further with Marcaine.  I then closed the subcutaneous tissue with interrupted 3-0 Vicryl sutures and closed the skin with a running 4-0 Monocryl.  Dermabond was then applied.  The patient tolerated the procedure well.  All the counts were correct at the end of the procedure.  The patient was then extubated in the operating room and taken in a  stable condition to the recovery room.          Shatera Rennert A   Date: 12/31/2017  Time: 8:08 AM

## 2018-01-01 ENCOUNTER — Encounter (HOSPITAL_COMMUNITY): Payer: Self-pay | Admitting: Surgery

## 2018-04-13 ENCOUNTER — Emergency Department (HOSPITAL_COMMUNITY)
Admission: EM | Admit: 2018-04-13 | Discharge: 2018-04-13 | Disposition: A | Discharge status: Home / Self Care | Payer: Medicare HMO | Attending: Emergency Medicine | Admitting: Emergency Medicine

## 2018-04-13 ENCOUNTER — Other Ambulatory Visit: Payer: Self-pay

## 2018-04-13 ENCOUNTER — Encounter (HOSPITAL_COMMUNITY): Payer: Self-pay | Admitting: Emergency Medicine

## 2018-04-13 DIAGNOSIS — R42 Dizziness and giddiness: Secondary | ICD-10-CM | POA: Diagnosis not present

## 2018-04-13 DIAGNOSIS — T675XXA Heat exhaustion, unspecified, initial encounter: Secondary | ICD-10-CM | POA: Insufficient documentation

## 2018-04-13 DIAGNOSIS — E039 Hypothyroidism, unspecified: Secondary | ICD-10-CM | POA: Diagnosis not present

## 2018-04-13 DIAGNOSIS — R112 Nausea with vomiting, unspecified: Secondary | ICD-10-CM | POA: Diagnosis not present

## 2018-04-13 DIAGNOSIS — J45909 Unspecified asthma, uncomplicated: Secondary | ICD-10-CM | POA: Diagnosis not present

## 2018-04-13 DIAGNOSIS — X30XXXA Exposure to excessive natural heat, initial encounter: Secondary | ICD-10-CM | POA: Insufficient documentation

## 2018-04-13 DIAGNOSIS — N189 Chronic kidney disease, unspecified: Secondary | ICD-10-CM | POA: Insufficient documentation

## 2018-04-13 DIAGNOSIS — R202 Paresthesia of skin: Secondary | ICD-10-CM | POA: Insufficient documentation

## 2018-04-13 DIAGNOSIS — Z79899 Other long term (current) drug therapy: Secondary | ICD-10-CM | POA: Diagnosis not present

## 2018-04-13 LAB — CBC WITH DIFFERENTIAL/PLATELET
Basophils Absolute: 0 10*3/uL (ref 0.0–0.1)
Basophils Relative: 0 %
Eosinophils Absolute: 0.1 10*3/uL (ref 0.0–0.7)
Eosinophils Relative: 1 %
HCT: 28.5 % — ABNORMAL LOW (ref 36.0–46.0)
HEMOGLOBIN: 9 g/dL — AB (ref 12.0–15.0)
LYMPHS ABS: 1.5 10*3/uL (ref 0.7–4.0)
LYMPHS PCT: 25 %
MCH: 23 pg — AB (ref 26.0–34.0)
MCHC: 31.6 g/dL (ref 30.0–36.0)
MCV: 72.9 fL — AB (ref 78.0–100.0)
Monocytes Absolute: 0.4 10*3/uL (ref 0.1–1.0)
Monocytes Relative: 7 %
NEUTROS ABS: 3.9 10*3/uL (ref 1.7–7.7)
NEUTROS PCT: 67 %
Platelets: 169 10*3/uL (ref 150–400)
RBC: 3.91 MIL/uL (ref 3.87–5.11)
RDW: 15.7 % — AB (ref 11.5–15.5)
WBC: 5.8 10*3/uL (ref 4.0–10.5)

## 2018-04-13 LAB — URINALYSIS, ROUTINE W REFLEX MICROSCOPIC
Bilirubin Urine: NEGATIVE
Glucose, UA: NEGATIVE mg/dL
Hgb urine dipstick: NEGATIVE
KETONES UR: 20 mg/dL — AB
NITRITE: NEGATIVE
PH: 9 — AB (ref 5.0–8.0)
Protein, ur: 30 mg/dL — AB
Specific Gravity, Urine: 1.014 (ref 1.005–1.030)

## 2018-04-13 LAB — COMPREHENSIVE METABOLIC PANEL
ALK PHOS: 49 U/L (ref 38–126)
ALT: 34 U/L (ref 0–44)
ANION GAP: 8 (ref 5–15)
AST: 43 U/L — ABNORMAL HIGH (ref 15–41)
Albumin: 3.7 g/dL (ref 3.5–5.0)
BUN: 10 mg/dL (ref 6–20)
CHLORIDE: 106 mmol/L (ref 98–111)
CO2: 26 mmol/L (ref 22–32)
Calcium: 8.5 mg/dL — ABNORMAL LOW (ref 8.9–10.3)
Creatinine, Ser: 0.93 mg/dL (ref 0.44–1.00)
GFR calc Af Amer: >60 mL/min (ref >60)
GFR calc non Af Amer: >60 mL/min (ref >60)
GLUCOSE: 97 mg/dL (ref 70–99)
POTASSIUM: 5.4 mmol/L — AB (ref 3.5–5.1)
SODIUM: 140 mmol/L (ref 135–145)
Total Bilirubin: 1.8 mg/dL — ABNORMAL HIGH (ref 0.3–1.2)
Total Protein: 7.1 g/dL (ref 6.5–8.1)

## 2018-04-13 LAB — BASIC METABOLIC PANEL
Anion gap: 5 (ref 5–15)
BUN: 7 mg/dL (ref 6–20)
CHLORIDE: 123 mmol/L — AB (ref 98–111)
CO2: 17 mmol/L — ABNORMAL LOW (ref 22–32)
Calcium: 5.3 mg/dL — CL (ref 8.9–10.3)
Creatinine, Ser: 0.53 mg/dL (ref 0.44–1.00)
GFR calc non Af Amer: >60 mL/min (ref >60)
Glucose, Bld: 66 mg/dL — ABNORMAL LOW (ref 70–99)
SODIUM: 145 mmol/L (ref 135–145)

## 2018-04-13 LAB — CK: Total CK: 243 U/L — ABNORMAL HIGH (ref 38–234)

## 2018-04-13 LAB — CBG MONITORING, ED: Glucose-Capillary: 93 mg/dL (ref 70–99)

## 2018-04-13 MED ORDER — SODIUM CHLORIDE 0.9 % IV BOLUS
1000.0000 mL | Freq: Once | INTRAVENOUS | Status: AC
  Administered 2018-04-13: 1000 mL via INTRAVENOUS

## 2018-04-13 MED ORDER — METOCLOPRAMIDE HCL 5 MG/ML IJ SOLN
10.0000 mg | Freq: Once | INTRAMUSCULAR | Status: AC
  Administered 2018-04-13: 10 mg via INTRAVENOUS
  Filled 2018-04-13: qty 2

## 2018-04-13 NOTE — ED Provider Notes (Signed)
[facility] DEPT Provider Note   CSN: [id number] Arrival date & time: 04/13/18  1618     History   Chief Complaint Chief Complaint  Patient presents with  . Heat Exposure    HPI Christine Bradley is a 52 y.o. female who presents with nausea and lightheadedness.  Past medical history significant for chronic migraine, asthma, fibromyalgia.  The patient states that she was drinking a lot of liquor last night.  This morning she was with her family and felt dehydrated but was drinking fluids.  She was outside for about 2 hours when she started to feel lightheaded like she was going to pass out and nauseous and short of breath.  She had 2 episodes of emesis.  She received a small amount of fluid and Zofran by EMS but still has feels nauseous and lightheaded.  She also had tingling in her bilateral hands which is resolved.  She denies any pain.  Specifically no headache, chest pain, shortness of breath, abdominal pain, diarrhea or urinary symptoms.  HPI  Past Medical History:  Diagnosis Date  . Anxiety   . Arthritis   . Asthma   . Carpal tunnel syndrome   . Chronic bronchitis ([place])   . Chronic kidney disease   . Fibromyalgia   . GERD (gastroesophageal reflux disease)   . Headache    Chronic migraines  . History of urinary urgency   . Neuromuscular disorder (HCC)    fibromyalgia  . Thyroid disease     There are no active problems to display for this patient.   Past Surgical History:  Procedure Laterality Date  . ABDOMINAL HYSTERECTOMY    . foot surgery  1997  . RADIOACTIVE SEED GUIDED EXCISIONAL BREAST BIOPSY Left 12/31/2017   Procedure: LEFT BREAST SEED GUIDED EXCISIONAL BIOPSY;  Surgeon: Coralie Keens, MD;  Location: [facility];  Service: General;  Laterality: Left;     OB History   None      Home Medications    Prior to Admission medications   Medication Sig Start Date End Date Taking? Authorizing Provider  albuterol (PROVENTIL) (2.5 MG/3ML)  0.083% nebulizer solution Inhale 3 mLs into the lungs every 6 (six) hours as needed for shortness of breath. 08/27/17   [provider]  Calcium Carbonate-Vitamin D (CALCIUM 500 + D PO) Take 2 tablets by mouth daily.    [provider]  cetirizine (ZYRTEC) 10 MG tablet Take 10 mg by mouth at bedtime.     [provider]  Cholecalciferol (VITAMIN D) 2000 units tablet Take 4,000 Units by mouth daily.    [provider]  cyclobenzaprine (FLEXERIL) 10 MG tablet Take 10 mg by mouth at bedtime.    Lorenza Burton, FNP  FIBER SELECT GUMMIES CHEW Chew 2 tablets by mouth daily.    [provider]  FLUoxetine (PROZAC) 40 MG capsule Take 40 mg by mouth every morning.    [provider]  fluticasone (FLONASE) 50 MCG/ACT nasal spray Place 2 sprays into both nostrils daily.    [provider]  Fluticasone-Salmeterol (ADVAIR) 250-50 MCG/DOSE AEPB Inhale 1 puff into the lungs 2 (two) times daily.    [provider]  guaiFENesin-codeine (ROBITUSSIN AC) 100-10 MG/5ML syrup Take 5 mLs by mouth 2 (two) times daily as needed for cough.    [provider]  ipratropium (ATROVENT HFA) 17 MCG/ACT inhaler Inhale 2 puffs into the lungs every 6 (six) hours as needed for wheezing.     Jonny Ruiz,  Carola, MD  lidocaine (LIDODERM) 5 % Place 1 patch onto the skin daily as needed (pain). Remove & Discard patch within 12 hours or as directed by MD    [provider]  lovastatin (MEVACOR) 40 MG tablet Take 40 mg by mouth daily.    [provider]  montelukast (SINGULAIR) 10 MG tablet Take 10 mg by mouth at bedtime.    Lorenza Burton, FNP  Multiple Vitamins-Minerals (HAIR/SKIN/NAILS PO) Take 2 tablets by mouth every morning.     [provider]  Multiple Vitamins-Minerals (MULTIVITAMIN GUMMIES ADULT PO) Take 2 tablets by mouth every morning.    [provider]  naproxen (NAPROSYN) 500 MG tablet Take 500 mg by mouth  every 4 (four) hours as needed for moderate pain.    [provider]  Omega-3 Fatty Acids (FISH OIL) 1000 MG CAPS Take 1,000 mg by mouth daily.    [provider]  oxybutynin (DITROPAN-XL) 5 MG 24 hr tablet Take 5 mg by mouth daily.    [provider]  oxyCODONE (OXY IR/ROXICODONE) 5 MG immediate release tablet Take 1 tablet (5 mg total) by mouth every 6 (six) hours as needed for moderate pain or severe pain. 12/31/17   Coralie Keens, MD  pantoprazole (PROTONIX) 40 MG tablet Take 40 mg by mouth daily before breakfast.  11/03/14 12/16/17  [provider]  Polyethyl Glycol-Propyl Glycol (SYSTANE) 0.4-0.3 % SOLN Place 1 drop into both eyes 3 (three) times daily as needed (dry eyes).     [provider]  pregabalin (LYRICA) 150 MG capsule Take 150 mg by mouth at bedtime.     [provider]  Probiotic Product (PROBIOTIC PO) Take 1 capsule by mouth 2 (two) times daily.     [provider]  promethazine-dextromethorphan (PROMETHAZINE-DM) 6.25-15 MG/5ML syrup Take 2.5 mLs by mouth 4 (four) times daily as needed for cough. Patient taking differently: Take 5 mLs by mouth at bedtime as needed for cough.  09/01/15   Little, Wenda Overland, MD  topiramate (TOPAMAX) 100 MG tablet Take 200 mg by mouth at bedtime.     Lorenza Burton, FNP  vitamin C (ASCORBIC ACID) 250 MG tablet Take 500 mg by mouth daily.    [provider]    Family History Family History  Problem Relation Age of Onset  . Arthritis Mother   . Breast cancer Neg Hx     Social History Social History   Tobacco Use  . Smoking status: Never Smoker  . Smokeless tobacco: Never Used  Substance Use Topics  . Alcohol use: Yes    Comment: occ  . Drug use: No     Allergies   Albuterol; Maxalt [rizatriptan]; Penicillins; and Voltaren [diclofenac sodium]   Review of Systems Review of Systems  Constitutional: Negative for fever.  Respiratory: Positive for shortness of  breath (Resolved).   Cardiovascular: Negative for chest pain.  Gastrointestinal: Positive for nausea and vomiting. Negative for abdominal pain and diarrhea.  Genitourinary: Negative for dysuria and frequency.  Neurological: Positive for light-headedness. Negative for syncope and headaches.  All other systems reviewed and are negative.    Physical Exam Updated Vital Signs BP 139/82 (BP Location: Left Arm)   Pulse 75   Temp 98.7 F (37.1 C) (Oral)   Resp 16   SpO2 100%   Physical Exam  Constitutional: She is oriented to person, place, and time. She appears well-developed and well-nourished. No distress.  Calm and cooperative.  Lightheaded with positional changes  HENT:  Head: Normocephalic and atraumatic.  Eyes: Pupils are equal, round, and reactive to light. Conjunctivae are normal. Right eye exhibits no discharge. Left eye exhibits no discharge. No scleral icterus.  Neck: Normal range of motion.  Cardiovascular: Normal rate and regular rhythm.  Pulmonary/Chest: Effort normal and breath sounds normal. No respiratory distress.  Abdominal: Soft. Bowel sounds are normal. She exhibits no distension. There is no tenderness.  Neurological: She is alert and oriented to person, place, and time.  Skin: Skin is warm and dry.  Psychiatric: She has a normal mood and affect. Her behavior is normal.  Nursing note and vitals reviewed.    ED Treatments / Results  Labs (all labs ordered are listed, but only abnormal results are displayed) Labs Reviewed  BASIC METABOLIC PANEL - Abnormal; Notable for the following components:      Result Value   Chloride 123 (*)    CO2 17 (*)    Glucose, Bld 66 (*)    Calcium 5.3 (*)    All other components within normal limits  CBC WITH DIFFERENTIAL/PLATELET - Abnormal; Notable for the following components:   Hemoglobin 9.0 (*)    HCT 28.5 (*)    MCV 72.9 (*)    MCH 23.0 (*)    RDW 15.7 (*)    All other components within normal limits  URINALYSIS,  ROUTINE W REFLEX MICROSCOPIC - Abnormal; Notable for the following components:   APPearance HAZY (*)    pH 9.0 (*)    Ketones, ur 20 (*)    Protein, ur 30 (*)    Leukocytes, UA MODERATE (*)    Bacteria, UA RARE (*)    All other components within normal limits  CK - Abnormal; Notable for the following components:   Total CK 243 (*)    All other components within normal limits  COMPREHENSIVE METABOLIC PANEL - Abnormal; Notable for the following components:   Potassium 5.4 (*)    Calcium 8.5 (*)    AST 43 (*)    Total Bilirubin 1.8 (*)    All other components within normal limits  CBG MONITORING, ED    EKG None  Radiology No results found.  Procedures Procedures (including critical care time)  Medications Ordered in ED Medications  sodium chloride 0.9 % bolus 1,000 mL (0 mLs Intravenous Stopped 04/13/18 1911)  metoCLOPramide (REGLAN) injection 10 mg (10 mg Intravenous Given 04/13/18 1729)  sodium chloride 0.9 % bolus 1,000 mL (0 mLs Intravenous Stopped 04/13/18 2124)     Initial Impression / Assessment and Plan / ED Course  I have reviewed the triage vital signs and the nursing notes.  Pertinent labs & imaging results that were available during my care of the patient were reviewed by me and considered in my medical decision making (see chart for details).  53 year old female with dehydration and nausea/vomiting after drinking liquor last night. She is mildly hypertensive but otherwise vitals are normal. CBC is remarkable for anemia which is worse than baseline. Initial BMP was remarkable for multiple derangements which are thought to be due to lab error. Will redraw and get a CMP, CK.  CMP shows mild hyperkalemia but is hemolyzed. Kidney function is normal. CK is mildly elevated. UA has ketones and protein consistent with dehydration. She was given 2L fluids and Reglan and feels better. Will d/c with PCP f/u.  Final Clinical Impressions(s) / ED Diagnoses   Final diagnoses:    Heat exhaustion, initial encounter  Non-intractable vomiting with  nausea, unspecified vomiting type    ED Discharge Orders    None       Iris Pert 04/13/18 2138    Drenda Freeze, MD 04/13/18 2255

## 2018-04-13 NOTE — ED Triage Notes (Signed)
Per [facility] pt has spent 2 hours outside in heat. Emesis twice. Dizziness. Received 4mg  zofran and 150cc NaCl enroute.

## 2018-04-13 NOTE — Discharge Instructions (Signed)
Please drink plenty of fluids °Follow up with your doctor °Return if worsening °

## 2018-04-13 NOTE — ED Notes (Signed)
Date and time results received: 04/13/18 7:24 PM (use smartphrase ".now" to insert current time)  Test:  Calcium Critical Value: 5.3  Name of Provider Notified: Marisa Severin. PA  Orders Received? Or Actions Taken?: NONE

## 2018-04-13 NOTE — ED Notes (Signed)
Bed: JQ73 Expected date:  Expected time:  Means of arrival:  Comments: 52 yo heat exhaustion

## 2018-11-19 DIAGNOSIS — H8110 Benign paroxysmal vertigo, unspecified ear: Secondary | ICD-10-CM | POA: Insufficient documentation

## 2019-03-24 DIAGNOSIS — M75120 Complete rotator cuff tear or rupture of unspecified shoulder, not specified as traumatic: Secondary | ICD-10-CM | POA: Insufficient documentation

## 2020-03-30 ENCOUNTER — Other Ambulatory Visit: Payer: Self-pay | Admitting: Adult Medicine

## 2020-03-30 DIAGNOSIS — N644 Mastodynia: Secondary | ICD-10-CM

## 2020-04-15 ENCOUNTER — Ambulatory Visit: Payer: Medicare HMO

## 2020-04-15 ENCOUNTER — Other Ambulatory Visit: Payer: Self-pay

## 2020-04-15 ENCOUNTER — Ambulatory Visit
Admission: RE | Admit: 2020-04-15 | Discharge: 2020-04-15 | Disposition: A | Discharge status: Home / Self Care | Payer: Medicare HMO | Source: Ambulatory Visit | Attending: Adult Medicine | Admitting: Adult Medicine

## 2020-04-15 DIAGNOSIS — N644 Mastodynia: Secondary | ICD-10-CM

## 2020-05-27 DIAGNOSIS — K5909 Other constipation: Secondary | ICD-10-CM | POA: Insufficient documentation

## 2020-06-21 DIAGNOSIS — L4050 Arthropathic psoriasis, unspecified: Secondary | ICD-10-CM | POA: Insufficient documentation

## 2020-07-12 DIAGNOSIS — R7303 Prediabetes: Secondary | ICD-10-CM | POA: Insufficient documentation

## 2020-07-13 DIAGNOSIS — R202 Paresthesia of skin: Secondary | ICD-10-CM | POA: Insufficient documentation

## 2020-07-13 DIAGNOSIS — H9190 Unspecified hearing loss, unspecified ear: Secondary | ICD-10-CM | POA: Insufficient documentation

## 2020-07-15 ENCOUNTER — Other Ambulatory Visit: Payer: Self-pay | Admitting: Family Medicine

## 2020-07-15 DIAGNOSIS — M79604 Pain in right leg: Secondary | ICD-10-CM

## 2020-07-19 ENCOUNTER — Ambulatory Visit
Admission: RE | Admit: 2020-07-19 | Discharge: 2020-07-19 | Disposition: A | Discharge status: Home / Self Care | Payer: Medicare HMO | Source: Ambulatory Visit | Attending: Family Medicine | Admitting: Family Medicine

## 2020-07-19 DIAGNOSIS — M79604 Pain in right leg: Secondary | ICD-10-CM

## 2020-09-24 DIAGNOSIS — R42 Dizziness and giddiness: Secondary | ICD-10-CM

## 2020-09-24 HISTORY — DX: Dizziness and giddiness: R42

## 2020-11-29 DIAGNOSIS — L819 Disorder of pigmentation, unspecified: Secondary | ICD-10-CM | POA: Insufficient documentation

## 2021-01-30 ENCOUNTER — Other Ambulatory Visit: Payer: Self-pay

## 2021-01-30 ENCOUNTER — Other Ambulatory Visit (HOSPITAL_BASED_OUTPATIENT_CLINIC_OR_DEPARTMENT_OTHER): Payer: Self-pay

## 2021-01-30 ENCOUNTER — Ambulatory Visit: Payer: Medicare HMO | Attending: Internal Medicine

## 2021-01-30 DIAGNOSIS — Z23 Encounter for immunization: Secondary | ICD-10-CM

## 2021-01-30 MED ORDER — PFIZER-BIONT COVID-19 VAC-TRIS 30 MCG/0.3ML IM SUSP
INTRAMUSCULAR | 0 refills | Status: DC
  Filled 2021-01-30: qty 0.3, 1d supply, fill #0

## 2021-01-30 NOTE — Progress Notes (Signed)
   Covid-19 Vaccination Clinic  Name:  Leslieanne Cobarrubias    MRN: [id number] DOB: [birth year]  01/30/2021  Ms. Broeker was observed post Covid-19 immunization for 15 minutes without incident. She was provided with Vaccine Information Sheet and instruction to access the V-Safe system.   Ms. Katayama was instructed to call 911 with any severe reactions post vaccine: Marland Kitchen Difficulty breathing  . Swelling of face and throat  . A fast heartbeat  . A bad rash all over body  . Dizziness and weakness   Immunizations Administered    Name Date Dose VIS Date Route   PFIZER Comrnaty(Gray TOP) Covid-19 Vaccine 01/30/2021 12:13 PM 0.3 mL 09/01/2020 Intramuscular   Manufacturer: [employer]   Lot: [id number]   NDC: [id number]

## 2021-01-31 ENCOUNTER — Ambulatory Visit: Payer: Medicare HMO

## 2021-02-15 ENCOUNTER — Other Ambulatory Visit (HOSPITAL_BASED_OUTPATIENT_CLINIC_OR_DEPARTMENT_OTHER): Payer: Self-pay

## 2021-02-27 ENCOUNTER — Emergency Department (HOSPITAL_COMMUNITY): Payer: Medicare HMO

## 2021-02-27 ENCOUNTER — Encounter (HOSPITAL_COMMUNITY): Payer: Self-pay

## 2021-02-27 ENCOUNTER — Other Ambulatory Visit: Payer: Self-pay

## 2021-02-27 ENCOUNTER — Emergency Department (HOSPITAL_COMMUNITY)
Admission: EM | Admit: 2021-02-27 | Discharge: 2021-02-28 | Disposition: A | Discharge status: Home / Self Care | Payer: Medicare HMO | Attending: Emergency Medicine | Admitting: Emergency Medicine

## 2021-02-27 DIAGNOSIS — Z7951 Long term (current) use of inhaled steroids: Secondary | ICD-10-CM | POA: Diagnosis not present

## 2021-02-27 DIAGNOSIS — N189 Chronic kidney disease, unspecified: Secondary | ICD-10-CM | POA: Insufficient documentation

## 2021-02-27 DIAGNOSIS — Z79899 Other long term (current) drug therapy: Secondary | ICD-10-CM | POA: Insufficient documentation

## 2021-02-27 DIAGNOSIS — J45909 Unspecified asthma, uncomplicated: Secondary | ICD-10-CM | POA: Diagnosis not present

## 2021-02-27 DIAGNOSIS — R079 Chest pain, unspecified: Secondary | ICD-10-CM | POA: Insufficient documentation

## 2021-02-27 DIAGNOSIS — Z20822 Contact with and (suspected) exposure to covid-19: Secondary | ICD-10-CM | POA: Insufficient documentation

## 2021-02-27 LAB — TROPONIN I (HIGH SENSITIVITY)
Troponin I (High Sensitivity): 3 ng/L (ref <18)
Troponin I (High Sensitivity): 4 ng/L (ref <18)

## 2021-02-27 LAB — BASIC METABOLIC PANEL
Anion gap: 6 (ref 5–15)
BUN: 9 mg/dL (ref 6–20)
CO2: 27 mmol/L (ref 22–32)
Calcium: 8.9 mg/dL (ref 8.9–10.3)
Chloride: 105 mmol/L (ref 98–111)
Creatinine, Ser: 0.96 mg/dL (ref 0.44–1.00)
GFR, Estimated: >60 mL/min (ref >60)
Glucose, Bld: 97 mg/dL (ref 70–99)
Potassium: 3.6 mmol/L (ref 3.5–5.1)
Sodium: 138 mmol/L (ref 135–145)

## 2021-02-27 LAB — URINALYSIS, ROUTINE W REFLEX MICROSCOPIC
Bilirubin Urine: NEGATIVE
Glucose, UA: NEGATIVE mg/dL
Hgb urine dipstick: NEGATIVE
Ketones, ur: NEGATIVE mg/dL
Nitrite: NEGATIVE
Protein, ur: NEGATIVE mg/dL
Specific Gravity, Urine: 1.021 (ref 1.005–1.030)
pH: 5 (ref 5.0–8.0)

## 2021-02-27 LAB — CBC
HCT: 39.7 % (ref 36.0–46.0)
Hemoglobin: 12.1 g/dL (ref 12.0–15.0)
MCH: 22.7 pg — ABNORMAL LOW (ref 26.0–34.0)
MCHC: 30.5 g/dL (ref 30.0–36.0)
MCV: 74.6 fL — ABNORMAL LOW (ref 80.0–100.0)
Platelets: 272 10*3/uL (ref 150–400)
RBC: 5.32 MIL/uL — ABNORMAL HIGH (ref 3.87–5.11)
RDW: 15.9 % — ABNORMAL HIGH (ref 11.5–15.5)
WBC: 8.2 10*3/uL (ref 4.0–10.5)
nRBC: 0 % (ref 0.0–0.2)

## 2021-02-27 LAB — RESP PANEL BY RT-PCR (FLU A&B, COVID) ARPGX2
Influenza A by PCR: NEGATIVE
Influenza B by PCR: NEGATIVE
SARS Coronavirus 2 by RT PCR: NEGATIVE

## 2021-02-27 LAB — D-DIMER, QUANTITATIVE: D-Dimer, Quant: 0.51 ug/mL-FEU — ABNORMAL HIGH (ref 0.00–0.50)

## 2021-02-27 LAB — TSH: TSH: 0.823 u[IU]/mL (ref 0.350–4.500)

## 2021-02-27 NOTE — ED Provider Notes (Signed)
Emergency Medicine Provider Triage Evaluation Note  Christine Bradley , a 55 y.o. female  was evaluated in triage.  Pt complains of pleuritic cp, tachycardia. Sent in by rheumatologist/ pcp ( hx psoriatic arthritis/ psoriasis)  Review of Systems  Positive: cp Negative: fever  Physical Exam  There were no vitals taken for this visit. Gen:   Awake, no distress   Resp:  Normal effort  MSK:   Moves extremities without difficulty  Other:  tachcardia  Medical Decision Making  Medically screening exam initiated at 4:45 PM.  Appropriate orders placed.  [name] was informed that the remainder of the evaluation will be completed by another provider, this initial triage assessment does not replace that evaluation, and the importance of remaining in the ED until their evaluation is complete.  Tachycardia- labs ordered   [name], PA-C 02/27/21 1647    Long, Wonda Olds, MD 02/27/21 1714

## 2021-02-27 NOTE — ED Triage Notes (Signed)
Patient was at rheumatology office today and they noticed her HR in the 130s. Now HR 106 on assessment. Complains of mild chest pain on arrival and left arm pain. NAD

## 2021-02-28 MED ORDER — DICLOFENAC SODIUM 1 % EX GEL: 4.0000 g | Freq: Four times a day (QID) | CUTANEOUS | 0 refills | Status: DC

## 2021-02-28 NOTE — ED Provider Notes (Signed)
[facility] EMERGENCY DEPARTMENT Provider Note   CSN: [id number] Arrival date & time: 02/27/21  1604     History No chief complaint on file.   Christine Bradley is a 55 y.o. female.  55 yo F with a chief complaints of chest pain.  This is left-sided and sharp.  Seems to come and go.  No shortness of breath with this.  No trauma.  She felt the pain went into her left arm off and on.  Felt like her heart was racing on her as well.  This been going on actually for a few days now.  She did not think much of it thought maybe she was just a little bit more tired than normal as why she was having the discomfort.  She saw her rheumatologist today and was noted to have a heart rate in the 130s they discussed the case with her family physician and it was determined to send her to the ED.  She denies history of MI denies hypertension diabetes smoking or family history.  She does have a history of hyperlipidemia.  She denies history of PE or DVT denies hemoptysis has had some left knee pain with focal swelling but denies unilateral lower extremity edema denies recent surgery immobilization or hospitalization.  Denies history of cancer.  Denies estrogen use.  The history is provided by the patient.  Chest Pain Pain location:  L chest Pain quality: sharp   Pain radiates to:  Does not radiate Pain severity:  Moderate Onset quality:  Gradual Duration:  3 days Timing:  Intermittent Progression:  Waxing and waning Chronicity:  New Relieved by:  Nothing Worsened by:  Nothing Ineffective treatments:  None tried Associated symptoms: no dizziness, no fever, no headache, no nausea, no palpitations, no shortness of breath and no vomiting        Past Medical History:  Diagnosis Date  . Anxiety   . Arthritis   . Asthma   . Carpal tunnel syndrome   . Chronic bronchitis ([place])   . Chronic kidney disease   . Fibromyalgia   . GERD (gastroesophageal reflux disease)   . Headache    Chronic  migraines  . History of urinary urgency   . Neuromuscular disorder (HCC)    fibromyalgia  . Thyroid disease     There are no problems to display for this patient.   Past Surgical History:  Procedure Laterality Date  . ABDOMINAL HYSTERECTOMY    . BREAST EXCISIONAL BIOPSY Left 12/31/2017  . foot surgery  1997  . RADIOACTIVE SEED GUIDED EXCISIONAL BREAST BIOPSY Left 12/31/2017   Procedure: LEFT BREAST SEED GUIDED EXCISIONAL BIOPSY;  Surgeon: Coralie Keens, MD;  Location: [facility];  Service: General;  Laterality: Left;     OB History   No obstetric history on file.     Family History  Problem Relation Age of Onset  . Arthritis Mother   . Breast cancer Mother 19    Social History   Tobacco Use  . Smoking status: Never Smoker  . Smokeless tobacco: Never Used  Vaping Use  . Vaping Use: Never used  Substance Use Topics  . Alcohol use: Yes    Comment: occ  . Drug use: No    Home Medications Prior to Admission medications   Medication Sig Start Date End Date Taking? Authorizing Provider  diclofenac Sodium (VOLTAREN) 1 % GEL Apply 4 g topically 4 (four) times daily. 02/28/21  Yes Deno Etienne, DO  albuterol (PROVENTIL) (  2.5 MG/3ML) 0.083% nebulizer solution Inhale 3 mLs into the lungs every 6 (six) hours as needed for shortness of breath. 08/27/17   [provider]  Calcium Carbonate-Vitamin D (CALCIUM 500 + D PO) Take 2 tablets by mouth daily.    [provider]  cetirizine (ZYRTEC) 10 MG tablet Take 10 mg by mouth at bedtime.     [provider]  Cholecalciferol (VITAMIN D) 2000 units tablet Take 4,000 Units by mouth daily.    [provider]  COVID-19 mRNA Vac-TriS, Pfizer, (PFIZER-BIONT COVID-19 VAC-TRIS) SUSP injection Inject into the muscle. 01/30/21   Carlyle Basques, MD  cyclobenzaprine (FLEXERIL) 10 MG tablet Take 10 mg by mouth at bedtime.    Lorenza Burton, FNP  FIBER SELECT GUMMIES CHEW Chew 2 tablets by mouth daily.    [provider]  FLUoxetine (PROZAC) 40 MG capsule Take 40 mg by mouth every morning.    [provider]  fluticasone (FLONASE) 50 MCG/ACT nasal spray Place 2 sprays into both nostrils daily.    [provider]  Fluticasone-Salmeterol (ADVAIR) 250-50 MCG/DOSE AEPB Inhale 1 puff into the lungs 2 (two) times daily.    [provider]  guaiFENesin-codeine (ROBITUSSIN AC) 100-10 MG/5ML syrup Take 5 mLs by mouth 2 (two) times daily as needed for cough.    [provider]  ipratropium (ATROVENT HFA) 17 MCG/ACT inhaler Inhale 2 puffs into the lungs every 6 (six) hours as needed for wheezing.     Angelina Pih, MD  lidocaine (LIDODERM) 5 % Place 1 patch onto the skin daily as needed (pain). Remove & Discard patch within 12 hours or as directed by MD    [provider]  lovastatin (MEVACOR) 40 MG tablet Take 40 mg by mouth daily.    [provider]  montelukast (SINGULAIR) 10 MG tablet Take 10 mg by mouth at bedtime.    Lorenza Burton, FNP  Multiple Vitamins-Minerals (HAIR/SKIN/NAILS PO) Take 2 tablets by mouth every morning.     [provider]  Multiple Vitamins-Minerals (MULTIVITAMIN GUMMIES ADULT PO) Take 2 tablets by mouth every morning.    [provider]  naproxen (NAPROSYN) 500 MG tablet Take 500 mg by mouth every 4 (four) hours as needed for moderate pain.    [provider]  Omega-3 Fatty Acids (FISH OIL) 1000 MG CAPS Take 1,000 mg by mouth daily.    [provider]  oxybutynin (DITROPAN-XL) 5 MG 24 hr tablet Take 5 mg by mouth daily.    [provider]  oxyCODONE (OXY IR/ROXICODONE) 5 MG immediate release tablet Take 1 tablet (5 mg total) by mouth every 6 (six) hours as needed for moderate pain or severe pain. 12/31/17   Coralie Keens, MD  pantoprazole (PROTONIX) 40 MG tablet Take 40 mg by mouth daily before breakfast.  11/03/14 12/16/17  [provider]  Polyethyl Glycol-Propyl Glycol  (SYSTANE) 0.4-0.3 % SOLN Place 1 drop into both eyes 3 (three) times daily as needed (dry eyes).     [provider]  pregabalin (LYRICA) 150 MG capsule Take 150 mg by mouth at bedtime.     [provider]  Probiotic Product (PROBIOTIC PO) Take 1 capsule by mouth 2 (two) times daily.     [provider]  promethazine-dextromethorphan (PROMETHAZINE-DM) 6.25-15 MG/5ML syrup Take 2.5 mLs by mouth 4 (four) times daily as needed for cough. Patient taking differently: Take 5 mLs by mouth at bedtime as needed for cough.  09/01/15   Little,  Wenda Overland, MD  topiramate (TOPAMAX) 100 MG tablet Take 200 mg by mouth at bedtime.     Lorenza Burton, FNP  vitamin C (ASCORBIC ACID) 250 MG tablet Take 500 mg by mouth daily.    [provider]    Allergies    Albuterol, Maxalt [rizatriptan], Penicillins, and Voltaren [diclofenac sodium]  Review of Systems   Review of Systems  Constitutional: Negative for chills and fever.  HENT: Negative for congestion and rhinorrhea.   Eyes: Negative for redness and visual disturbance.  Respiratory: Negative for shortness of breath and wheezing.   Cardiovascular: Positive for chest pain. Negative for palpitations.  Gastrointestinal: Negative for nausea and vomiting.  Genitourinary: Negative for dysuria and urgency.  Musculoskeletal: Negative for arthralgias and myalgias.  Skin: Negative for pallor and wound.  Neurological: Negative for dizziness and headaches.    Physical Exam Updated Vital Signs BP 127/86 (BP Location: Right Arm)   Pulse 89   Temp 98.1 F (36.7 C) (Oral)   Resp 16   SpO2 100%   Physical Exam Vitals and nursing note reviewed.  Constitutional:      General: She is not in acute distress.    Appearance: She is well-developed. She is not diaphoretic.  HENT:     Head: Normocephalic and atraumatic.  Eyes:     Pupils: Pupils are equal, round, and reactive to light.  Cardiovascular:     Rate and Rhythm:  Normal rate and regular rhythm.     Heart sounds: No murmur heard. No friction rub. No gallop.   Pulmonary:     Effort: Pulmonary effort is normal.     Breath sounds: No wheezing or rales.  Abdominal:     General: There is no distension.     Palpations: Abdomen is soft.     Tenderness: There is no abdominal tenderness.  Musculoskeletal:        General: Tenderness present.     Cervical back: Normal range of motion and neck supple.     Comments: Palpation of the left anterior chest about the sternal border ribs 4 through 6 reproduces the patient's symptoms.  Skin:    General: Skin is warm and dry.  Neurological:     Mental Status: She is alert and oriented to person, place, and time.  Psychiatric:        Behavior: Behavior normal.     ED Results / Procedures / Treatments   Labs (all labs ordered are listed, but only abnormal results are displayed) Labs Reviewed  CBC - Abnormal; Notable for the following components:      Result Value   RBC 5.32 (*)    MCV 74.6 (*)    MCH 22.7 (*)    RDW 15.9 (*)    All other components within normal limits  D-DIMER, QUANTITATIVE - Abnormal; Notable for the following components:   D-Dimer, Quant 0.51 (*)    All other components within normal limits  URINALYSIS, ROUTINE W REFLEX MICROSCOPIC - Abnormal; Notable for the following components:   APPearance HAZY (*)    Leukocytes,Ua LARGE (*)    Bacteria, UA FEW (*)    All other components within normal limits  RESP PANEL BY RT-PCR (FLU A&B, COVID) ARPGX2  BASIC METABOLIC PANEL  TSH  TROPONIN I (HIGH SENSITIVITY)  TROPONIN I (HIGH SENSITIVITY)    EKG EKG Interpretation  Date/Time:  Monday February 27 2021 16:39:22 EDT Ventricular Rate:  104 PR Interval:  194 QRS Duration: 62 QT Interval:  314  QTC Calculation: 412 R Axis:   44 Text Interpretation: Sinus tachycardia Low voltage QRS Borderline ECG No significant change since last tracing Confirmed by Deno Etienne [phone]) on 02/28/2021 12:55:18  AM   Radiology DG Chest 2 View  Result Date: 02/27/2021 CLINICAL DATA:  Chest pain and tachycardia. EXAM: CHEST - 2 VIEW COMPARISON:  09/01/2015 FINDINGS: The heart size and mediastinal contours are within normal limits. Both lungs are clear. The visualized skeletal structures are unremarkable. IMPRESSION: No active cardiopulmonary disease. Electronically Signed   By: Kerby Moors M.D.   On: 02/27/2021 17:19    Procedures Procedures   Medications Ordered in ED Medications - No data to display  ED Course  I have reviewed the triage vital signs and the nursing notes.  Pertinent labs & imaging results that were available during my care of the patient were reviewed by me and considered in my medical decision making (see chart for details).    MDM Rules/Calculators/A&P                          55 yo F with a chief complaint of chest pain.  This is atypical in nature and reproduced on exam.  Patient has an EKG that is not significantly changed her troponins x2 are negative.  Her D-dimer is age-adjusted normal.  We will discharge the patient home.  PCP follow-up.  5:22 AM:  I have discussed the diagnosis/risks/treatment options with the patient and believe the pt to be eligible for discharge home to follow-up with PCP. We also discussed returning to the ED immediately if new or worsening sx occur. We discussed the sx which are most concerning (e.g., sudden worsening pain, fever, inability to tolerate by mouth) that necessitate immediate return. Medications administered to the patient during their visit and any new prescriptions provided to the patient are listed below.  Medications given during this visit Medications - No data to display   The patient appears reasonably screen and/or stabilized for discharge and I doubt any other medical condition or other [facility] requiring further screening, evaluation, or treatment in the ED at this time prior to discharge.   Final Clinical Impression(s) /  ED Diagnoses Final diagnoses:  Nonspecific chest pain    Rx / DC Orders ED Discharge Orders         Ordered    diclofenac Sodium (VOLTAREN) 1 % GEL  4 times daily        02/28/21 0105           Deno Etienne, DO 02/28/21 0522

## 2021-02-28 NOTE — Discharge Instructions (Signed)
Follow up with your doctor.    Return for symptoms to get worse upon exerting yourself or shortness of breath or if you feel you get a pass out or start coughing up blood.  Otherwise take the medication as prescribed.  Try to identify things that make this hurt at home and see if it something that you do in your daily life that you can change.

## 2021-03-28 ENCOUNTER — Other Ambulatory Visit: Payer: Self-pay | Admitting: Family Medicine

## 2021-03-28 DIAGNOSIS — Z1231 Encounter for screening mammogram for malignant neoplasm of breast: Secondary | ICD-10-CM

## 2021-05-19 ENCOUNTER — Other Ambulatory Visit: Payer: Self-pay

## 2021-05-19 ENCOUNTER — Ambulatory Visit
Admission: RE | Admit: 2021-05-19 | Discharge: 2021-05-19 | Disposition: A | Discharge status: Home / Self Care | Payer: Medicare HMO | Source: Ambulatory Visit | Attending: Family Medicine | Admitting: Family Medicine

## 2021-05-19 DIAGNOSIS — Z1231 Encounter for screening mammogram for malignant neoplasm of breast: Secondary | ICD-10-CM

## 2021-06-13 ENCOUNTER — Other Ambulatory Visit (HOSPITAL_BASED_OUTPATIENT_CLINIC_OR_DEPARTMENT_OTHER): Payer: Self-pay

## 2021-06-26 ENCOUNTER — Other Ambulatory Visit (HOSPITAL_BASED_OUTPATIENT_CLINIC_OR_DEPARTMENT_OTHER): Payer: Self-pay

## 2021-06-26 ENCOUNTER — Other Ambulatory Visit: Payer: Self-pay

## 2021-06-26 ENCOUNTER — Ambulatory Visit: Payer: Medicare Other | Attending: Internal Medicine

## 2021-06-26 DIAGNOSIS — Z23 Encounter for immunization: Secondary | ICD-10-CM

## 2021-06-26 MED ORDER — PFIZER COVID-19 VAC BIVALENT 30 MCG/0.3ML IM SUSP
INTRAMUSCULAR | 0 refills | Status: DC
  Filled 2021-06-26: qty 0.3, 1d supply, fill #0

## 2021-06-26 NOTE — Progress Notes (Signed)
   Covid-19 Vaccination Clinic  Name:  ENSLEE BIBBINS    MRN: [id number] DOB: 1965-12-20  06/26/2021  Ms. Franzen was observed post Covid-19 immunization for 15 minutes without incident. She was provided with Vaccine Information Sheet and instruction to access the V-Safe system.   Ms. Ehmann was instructed to call 911 with any severe reactions post vaccine: Difficulty breathing  Swelling of face and throat  A fast heartbeat  A bad rash all over body  Dizziness and weakness

## 2021-08-11 ENCOUNTER — Emergency Department (HOSPITAL_BASED_OUTPATIENT_CLINIC_OR_DEPARTMENT_OTHER): Payer: Medicare Other | Admitting: Radiology

## 2021-08-11 ENCOUNTER — Emergency Department (HOSPITAL_BASED_OUTPATIENT_CLINIC_OR_DEPARTMENT_OTHER)
Admission: EM | Admit: 2021-08-11 | Discharge: 2021-08-11 | Disposition: A | Discharge status: Home / Self Care | Payer: Medicare Other | Attending: Emergency Medicine | Admitting: Emergency Medicine

## 2021-08-11 ENCOUNTER — Other Ambulatory Visit: Payer: Self-pay

## 2021-08-11 DIAGNOSIS — M25551 Pain in right hip: Secondary | ICD-10-CM | POA: Diagnosis not present

## 2021-08-11 DIAGNOSIS — Y92410 Unspecified street and highway as the place of occurrence of the external cause: Secondary | ICD-10-CM | POA: Diagnosis not present

## 2021-08-11 DIAGNOSIS — N189 Chronic kidney disease, unspecified: Secondary | ICD-10-CM | POA: Insufficient documentation

## 2021-08-11 DIAGNOSIS — M542 Cervicalgia: Secondary | ICD-10-CM | POA: Insufficient documentation

## 2021-08-11 DIAGNOSIS — Z7951 Long term (current) use of inhaled steroids: Secondary | ICD-10-CM | POA: Insufficient documentation

## 2021-08-11 DIAGNOSIS — M7918 Myalgia, other site: Secondary | ICD-10-CM

## 2021-08-11 DIAGNOSIS — M546 Pain in thoracic spine: Secondary | ICD-10-CM | POA: Diagnosis not present

## 2021-08-11 DIAGNOSIS — M25561 Pain in right knee: Secondary | ICD-10-CM | POA: Diagnosis not present

## 2021-08-11 DIAGNOSIS — M25562 Pain in left knee: Secondary | ICD-10-CM | POA: Diagnosis not present

## 2021-08-11 DIAGNOSIS — J45909 Unspecified asthma, uncomplicated: Secondary | ICD-10-CM | POA: Insufficient documentation

## 2021-08-11 DIAGNOSIS — M545 Low back pain, unspecified: Secondary | ICD-10-CM | POA: Diagnosis not present

## 2021-08-11 MED ORDER — DICLOFENAC SODIUM 1 % EX GEL: 2.0000 g | Freq: Four times a day (QID) | CUTANEOUS | 0 refills | Status: DC
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
## 2021-08-11 NOTE — Discharge Instructions (Signed)
Please follow up with your primary care provider within 5-7 days for re-evaluation of your symptoms. If you do not have a primary care provider, information for a healthcare clinic has been provided for you to make arrangements for follow up care. Please return to the emergency department for any new or worsening symptoms. ° °

## 2021-08-11 NOTE — ED Notes (Signed)
Pt to xray

## 2021-08-11 NOTE — ED Notes (Signed)
Pt d/c home per MD order. Discharge summary reviewed, verbalize understanding.  Ambulatory off unit. No s/s of acute distress noted at discharge.
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
## 2021-08-14 NOTE — Progress Notes (Addendum)
DUE TO COVID-19 ONLY ONE VISITOR IS ALLOWED TO COME WITH YOU AND STAY IN THE WAITING ROOM ONLY DURING PRE OP AND PROCEDURE DAY OF SURGERY.  2 VISITOR  MAY VISIT WITH YOU AFTER SURGERY IN YOUR PRIVATE ROOM DURING VISITING HOURS ONLY!  YOU NEED TO HAVE A COVID 19 TEST ON__12/10/2020 @_  @_from  8am-3pm _____, THIS TEST MUST BE DONE BEFORE SURGERY,  Covid test is done at [place], [place] [address].  This is a drive thru.  No appt required. Please see map.                 Your procedure is scheduled on:    08/29/21   Report to [facility] Main  Entrance   Report to admitting at    1135AM     Call this number if you have problems the morning of surgery [phone]    REMEMBER: NO  SOLID FOOD CANDY OR GUM AFTER MIDNIGHT. CLEAR LIQUIDS UNTIL     1120am       . NOTHING BY MOUTH EXCEPT CLEAR LIQUIDS UNTIL     1120am  . PLEASE FINISH ENSURE DRINK PER SURGEON ORDER  WHICH NEEDS TO BE COMPLETED AT 1120am     .      CLEAR LIQUID DIET   Foods Allowed                                                                    Coffee and tea, regular and decaf                            Fruit ices (not with fruit pulp)                                      Iced Popsicles                                    Carbonated beverages, regular and diet                                    Cranberry, grape and apple juices Sports drinks like Gatorade Lightly seasoned clear broth or consume(fat free) Sugar, honey syrup ___________________________________________________________________      BRUSH YOUR TEETH MORNING OF SURGERY AND RINSE YOUR MOUTH OUT, NO CHEWING GUM CANDY OR MINTS.     Take these medicines the morning of surgery with A SIP OF WATER:   Nebulizer if needed, zyrtec, prozac, linzess, protonix eye drops as usual  DO NOT TAKE ANY DIABETIC MEDICATIONS DAY OF YOUR SURGERY                               You may not have any metal on your body including hair pins and               piercings  Do not wear jewelry, make-up, lotions, powders or perfumes, deodorant  Do not wear nail polish on your fingernails.  Do not shave  48 hours prior to surgery.              Men may shave face and neck.   Do not bring valuables to the hospital. [place].  Contacts, dentures or bridgework may not be worn into surgery.  Leave suitcase in the car. After surgery it may be brought to your room.     Patients discharged the day of surgery will not be allowed to drive home. IF YOU ARE HAVING SURGERY AND GOING HOME THE SAME DAY, YOU MUST HAVE AN ADULT TO DRIVE YOU HOME AND BE WITH YOU FOR 24 HOURS. YOU MAY GO HOME BY TAXI OR UBER OR ORTHERWISE, BUT AN ADULT MUST ACCOMPANY YOU HOME AND STAY WITH YOU FOR 24 HOURS.  Name and phone number of your driver:  Special Instructions: N/A              Please read over the following fact sheets you were given: _____________________________________________________________________  [facility] - Preparing for Surgery Before surgery, you can play an important role.  Because skin is not sterile, your skin needs to be as free of germs as possible.  You can reduce the number of germs on your skin by washing with CHG (chlorahexidine gluconate) soap before surgery.  CHG is an antiseptic cleaner which kills germs and bonds with the skin to continue killing germs even after washing. Please DO NOT use if you have an allergy to CHG or antibacterial soaps.  If your skin becomes reddened/irritated stop using the CHG and inform your nurse when you arrive at Short Stay. Do not shave (including legs and underarms) for at least 48 hours prior to the first CHG shower.  You may shave your face/neck. Please follow these instructions carefully:  1.  Shower with CHG Soap the night before surgery and the  morning of Surgery.  2.  If you choose to wash your hair, wash your hair first as usual with your  normal   shampoo.  3.  After you shampoo, rinse your hair and body thoroughly to remove the  shampoo.                           4.  Use CHG as you would any other liquid soap.  You can apply chg directly  to the skin and wash                       Gently with a scrungie or clean washcloth.  5.  Apply the CHG Soap to your body ONLY FROM THE NECK DOWN.   Do not use on face/ open                           Wound or open sores. Avoid contact with eyes, ears mouth and genitals (private parts).                       Wash face,  Genitals (private parts) with your normal soap.             6.  Wash thoroughly, paying special attention to the area where your surgery  will be performed.  7.  Thoroughly rinse your body with  warm water from the neck down.  8.  DO NOT shower/wash with your normal soap after using and rinsing off  the CHG Soap.                9.  Pat yourself dry with a clean towel.            10.  Wear clean pajamas.            11.  Place clean sheets on your bed the night of your first shower and do not  sleep with pets. Day of Surgery : Do not apply any lotions/deodorants the morning of surgery.  Please wear clean clothes to the hospital/surgery center.  FAILURE TO FOLLOW THESE INSTRUCTIONS MAY RESULT IN THE CANCELLATION OF YOUR SURGERY PATIENT SIGNATURE_________________________________  NURSE SIGNATURE__________________________________  ________________________________________________________________________
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
## 2021-08-16 ENCOUNTER — Encounter (HOSPITAL_COMMUNITY)
Admission: RE | Admit: 2021-08-16 | Discharge: 2021-08-16 | Disposition: A | Discharge status: Home / Self Care | Payer: Medicare Other | Source: Ambulatory Visit | Attending: Orthopedic Surgery | Admitting: Orthopedic Surgery

## 2021-08-16 ENCOUNTER — Encounter (HOSPITAL_COMMUNITY): Payer: Self-pay

## 2021-08-16 ENCOUNTER — Other Ambulatory Visit: Payer: Self-pay

## 2021-08-16 VITALS — BP 134/95 | HR 101 | Temp 98.3°F | Resp 16 | Ht 64.0 in | Wt 195.0 lb

## 2021-08-16 DIAGNOSIS — N189 Chronic kidney disease, unspecified: Secondary | ICD-10-CM | POA: Diagnosis not present

## 2021-08-16 DIAGNOSIS — M1611 Unilateral primary osteoarthritis, right hip: Secondary | ICD-10-CM | POA: Diagnosis not present

## 2021-08-16 DIAGNOSIS — Z01818 Encounter for other preprocedural examination: Secondary | ICD-10-CM

## 2021-08-16 DIAGNOSIS — J449 Chronic obstructive pulmonary disease, unspecified: Secondary | ICD-10-CM | POA: Diagnosis not present

## 2021-08-16 DIAGNOSIS — Z01812 Encounter for preprocedural laboratory examination: Secondary | ICD-10-CM | POA: Insufficient documentation

## 2021-08-16 HISTORY — DX: Chronic obstructive pulmonary disease, unspecified: J44.9

## 2021-08-16 HISTORY — DX: Pneumonia, unspecified organism: J18.9

## 2021-08-16 LAB — COMPREHENSIVE METABOLIC PANEL
ALT: 14 U/L (ref 0–44)
AST: 18 U/L (ref 15–41)
Albumin: 3.6 g/dL (ref 3.5–5.0)
Alkaline Phosphatase: 68 U/L (ref 38–126)
Anion gap: 6 (ref 5–15)
BUN: 15 mg/dL (ref 6–20)
CO2: 25 mmol/L (ref 22–32)
Calcium: 8.8 mg/dL — ABNORMAL LOW (ref 8.9–10.3)
Chloride: 109 mmol/L (ref 98–111)
Creatinine, Ser: 0.82 mg/dL (ref 0.44–1.00)
GFR, Estimated: >60 mL/min (ref >60)
Glucose, Bld: 107 mg/dL — ABNORMAL HIGH (ref 70–99)
Potassium: 3.8 mmol/L (ref 3.5–5.1)
Sodium: 140 mmol/L (ref 135–145)
Total Bilirubin: 0.7 mg/dL (ref 0.3–1.2)
Total Protein: 7.8 g/dL (ref 6.5–8.1)

## 2021-08-16 LAB — SURGICAL PCR SCREEN
MRSA, PCR: NEGATIVE
Staphylococcus aureus: NEGATIVE

## 2021-08-16 LAB — CBC
HCT: 40 % (ref 36.0–46.0)
Hemoglobin: 12 g/dL (ref 12.0–15.0)
MCH: 22.1 pg — ABNORMAL LOW (ref 26.0–34.0)
MCHC: 30 g/dL (ref 30.0–36.0)
MCV: 73.8 fL — ABNORMAL LOW (ref 80.0–100.0)
Platelets: 232 10*3/uL (ref 150–400)
RBC: 5.42 MIL/uL — ABNORMAL HIGH (ref 3.87–5.11)
RDW: 16.1 % — ABNORMAL HIGH (ref 11.5–15.5)
WBC: 6.2 10*3/uL (ref 4.0–10.5)
nRBC: 0 % (ref 0.0–0.2)

## 2021-08-16 NOTE — Progress Notes (Signed)
Anesthesia Chart Review   Case: [id number] Date/Time: 08/29/21 1405   Procedure: TOTAL HIP ARTHROPLASTY ANTERIOR APPROACH (Right: Hip)   Anesthesia type: Spinal   Pre-op diagnosis: Right hip osteoarthritis   Location: [facility] ROOM 10 / [facility]   Surgeons: Paralee Cancel, MD       DISCUSSION:55 y.o. never smoker with h/o GERD, COPD, CKD, right hip OA scheduled for above procedure 08/29/2021 with Dr. Paralee Cancel.   Clearance form PCP on chart.  VS: BP (!) 134/95   Pulse (!) 101   Temp 36.8 C (Oral)   Resp 16   Ht 5\' 4"  (1.626 m)   Wt 88.5 kg   SpO2 100%   BMI 33.47 kg/m   PROVIDERS: [name], MD is PCP    LABS: Labs reviewed: Acceptable for surgery. (all labs ordered are listed, but only abnormal results are displayed)  Labs Reviewed  CBC - Abnormal; Notable for the following components:      Result Value   RBC 5.42 (*)    MCV 73.8 (*)    MCH 22.1 (*)    RDW 16.1 (*)    All other components within normal limits  COMPREHENSIVE METABOLIC PANEL - Abnormal; Notable for the following components:   Glucose, Bld 107 (*)    Calcium 8.8 (*)    All other components within normal limits  SURGICAL PCR SCREEN  TYPE AND SCREEN     IMAGES:   EKG: 02/27/2021 Rate 104 bpm  Sinus tachycardia   CV:  Past Medical History:  Diagnosis Date   Anxiety    Arthritis    Carpal tunnel syndrome    Chronic bronchitis (HCC)    Chronic kidney disease    COPD (chronic obstructive pulmonary disease) (HCC)    Fibromyalgia    GERD (gastroesophageal reflux disease)    Headache    Chronic migraines   History of urinary urgency    Neuromuscular disorder (HCC)    fibromyalgia   Pneumonia    hx of x 2   Thyroid disease     Past Surgical History:  Procedure Laterality Date   ABDOMINAL HYSTERECTOMY     BREAST EXCISIONAL BIOPSY Left 12/31/2017   foot surgery  1997   RADIOACTIVE SEED GUIDED EXCISIONAL BREAST BIOPSY Left 12/31/2017   Procedure: LEFT BREAST SEED GUIDED EXCISIONAL  BIOPSY;  Surgeon: Coralie Keens, MD;  Location: [place];  Service: General;  Laterality: Left;    MEDICATIONS:  albuterol (PROVENTIL) (2.5 MG/3ML) 0.083% nebulizer solution   atorvastatin (LIPITOR) 10 MG tablet   cetirizine (ZYRTEC) 10 MG tablet   Cholecalciferol (VITAMIN D) 2000 units tablet   COVID-19 mRNA bivalent vaccine, Pfizer, (PFIZER COVID-19 VAC BIVALENT) injection   COVID-19 mRNA Vac-TriS, Pfizer, (PFIZER-BIONT COVID-19 VAC-TRIS) SUSP injection   cyclobenzaprine (FLEXERIL) 10 MG tablet   diclofenac Sodium (VOLTAREN) 1 % GEL   famotidine (PEPCID) 40 MG tablet   ferrous sulfate 325 (65 FE) MG tablet   FLUoxetine (PROZAC) 40 MG capsule   fluticasone (FLONASE) 50 MCG/ACT nasal spray   Fluticasone-Salmeterol (ADVAIR) 250-50 MCG/DOSE AEPB   folic acid (FOLVITE) 1 MG tablet   ipratropium (ATROVENT HFA) 17 MCG/ACT inhaler   linaclotide (LINZESS) 72 MCG capsule   methotrexate (RHEUMATREX) 2.5 MG tablet   montelukast (SINGULAIR) 10 MG tablet   Multiple Vitamins-Minerals (HAIR/SKIN/NAILS PO)   Multiple Vitamins-Minerals (MULTIVITAMIN GUMMIES ADULT PO)   nortriptyline (PAMELOR) 10 MG capsule   oxybutynin (DITROPAN-XL) 5 MG 24 hr tablet   pantoprazole (PROTONIX) 40 MG tablet  Polyethyl Glycol-Propyl Glycol 0.4-0.3 % SOLN   pregabalin (LYRICA) 150 MG capsule   promethazine-dextromethorphan (PROMETHAZINE-DM) 6.25-15 MG/5ML syrup   Secukinumab (COSENTYX) 150 MG/ML SOSY   topiramate (TOPAMAX) 100 MG tablet   vitamin C (ASCORBIC ACID) 250 MG tablet   No current facility-administered medications for this encounter.    Konrad Felix Ward, PA-C [facility] Pre-Surgical Testing [phone]

## 2021-08-24 NOTE — H&P (Signed)
TOTAL HIP ADMISSION H&P  Patient is admitted for right total hip arthroplasty.  Subjective:  Chief Complaint: right hip pain  HPI: Christine Bradley, 55 y.o. female, has a history of pain and functional disability in the right hip(s) due to arthritis and patient has failed non-surgical conservative treatments for greater than 12 weeks to include NSAID's and/or analgesics, corticosteriod injections, and activity modification.  Onset of symptoms was gradual starting 2 years ago with gradually worsening course since that time.The patient noted no past surgery on the right hip(s).  Patient currently rates pain in the right hip at 8 out of 10 with activity. Patient has worsening of pain with activity and weight bearing, pain that interfers with activities of daily living, and pain with passive range of motion. Patient has evidence of joint space narrowing by imaging studies. This condition presents safety issues increasing the risk of falls.  There is no current active infection.  There are no problems to display for this patient.  Past Medical History:  Diagnosis Date   Anxiety    Arthritis    Carpal tunnel syndrome    Chronic bronchitis (HCC)    Chronic kidney disease    COPD (chronic obstructive pulmonary disease) (HCC)    Fibromyalgia    GERD (gastroesophageal reflux disease)    Headache    Chronic migraines   History of urinary urgency    Neuromuscular disorder (HCC)    fibromyalgia   Pneumonia    hx of x 2   Thyroid disease     Past Surgical History:  Procedure Laterality Date   ABDOMINAL HYSTERECTOMY     BREAST EXCISIONAL BIOPSY Left 12/31/2017   foot surgery  1997   RADIOACTIVE SEED GUIDED EXCISIONAL BREAST BIOPSY Left 12/31/2017   Procedure: LEFT BREAST SEED GUIDED EXCISIONAL BIOPSY;  Surgeon: Coralie Keens, MD;  Location: [facility];  Service: General;  Laterality: Left;    No current facility-administered medications for this encounter.   Current Outpatient Medications   Medication Sig Dispense Refill Last Dose   albuterol (PROVENTIL) (2.5 MG/3ML) 0.083% nebulizer solution Inhale 3 mLs into the lungs every 6 (six) hours as needed for shortness of breath.      atorvastatin (LIPITOR) 10 MG tablet Take 10 mg by mouth in the morning.      cetirizine (ZYRTEC) 10 MG tablet Take 10 mg by mouth in the morning.      Cholecalciferol (VITAMIN D) 2000 units tablet Take 4,000 Units by mouth daily.      cyclobenzaprine (FLEXERIL) 10 MG tablet Take 20 mg by mouth at bedtime.      famotidine (PEPCID) 40 MG tablet Take 40 mg by mouth at bedtime.      ferrous sulfate 325 (65 FE) MG tablet Take 325 mg by mouth in the morning.      FLUoxetine (PROZAC) 40 MG capsule Take 40 mg by mouth every morning.      Fluticasone-Salmeterol (ADVAIR) 250-50 MCG/DOSE AEPB Inhale 1 puff into the lungs 2 (two) times daily.      folic acid (FOLVITE) 1 MG tablet Take 1 mg by mouth in the morning.      ipratropium (ATROVENT HFA) 17 MCG/ACT inhaler Inhale 2 puffs into the lungs every 6 (six) hours as needed for wheezing.       linaclotide (LINZESS) 72 MCG capsule Take 72 mcg by mouth daily before breakfast.      methotrexate (RHEUMATREX) 2.5 MG tablet Take 20 mg by mouth every Friday. Caution:Chemotherapy. Protect from  light.      montelukast (SINGULAIR) 10 MG tablet Take 10 mg by mouth in the morning.      Multiple Vitamins-Minerals (HAIR/SKIN/NAILS PO) Take 2 tablets by mouth every morning.       Multiple Vitamins-Minerals (MULTIVITAMIN GUMMIES ADULT PO) Take 2 tablets by mouth every morning.      nortriptyline (PAMELOR) 10 MG capsule Take 20 mg by mouth at bedtime.      oxybutynin (DITROPAN-XL) 5 MG 24 hr tablet Take 5 mg by mouth at bedtime.      pantoprazole (PROTONIX) 40 MG tablet Take 40 mg by mouth daily before breakfast.       Polyethyl Glycol-Propyl Glycol 0.4-0.3 % SOLN Place 1 drop into both eyes in the morning.      pregabalin (LYRICA) 150 MG capsule Take 150 mg by mouth at bedtime.        promethazine-dextromethorphan (PROMETHAZINE-DM) 6.25-15 MG/5ML syrup Take 2.5 mLs by mouth 4 (four) times daily as needed for cough. (Patient taking differently: Take 5 mLs by mouth at bedtime as needed for cough.) 60 mL 0    Secukinumab (COSENTYX) 150 MG/ML SOSY Inject 150 mg into the skin every 30 (thirty) days.      topiramate (TOPAMAX) 100 MG tablet Take 200 mg by mouth at bedtime.       vitamin C (ASCORBIC ACID) 250 MG tablet Take 500 mg by mouth daily.      COVID-19 mRNA bivalent vaccine, Pfizer, (PFIZER COVID-19 VAC BIVALENT) injection Inject into the muscle. 0.3 mL 0    COVID-19 mRNA Vac-TriS, Pfizer, (PFIZER-BIONT COVID-19 VAC-TRIS) SUSP injection Inject into the muscle. 0.3 mL 0    diclofenac Sodium (VOLTAREN) 1 % GEL Apply 2 g topically 4 (four) times daily. 100 g 0    fluticasone (FLONASE) 50 MCG/ACT nasal spray Place 2 sprays into both nostrils daily.      Allergies  Allergen Reactions   Albuterol Other (See Comments)    Inhaler only (nebulizer is fine) - reaction: worsened asthma and bronchitis   Maxalt [Rizatriptan]     Worsened migraines    Penicillins Other (See Comments)    "raw on my bottom" Has patient had a PCN reaction causing immediate rash, facial/tongue/throat swelling, SOB or lightheadedness with hypotension: No Has patient had a PCN reaction causing severe rash involving mucus membranes or skin necrosis: No Has patient had a PCN reaction that required hospitalization No Has patient had a PCN reaction occurring within the last 10 years: No If all of the above answers are "NO", then may proceed with Cephalosporin use.    Voltaren [Diclofenac Sodium] Other (See Comments)    Stomach ulcers    Social History   Tobacco Use   Smoking status: Never   Smokeless tobacco: Never  Substance Use Topics   Alcohol use: Never    Family History  Problem Relation Age of Onset   Arthritis Mother    Breast cancer Mother 80     Review of Systems Well nourished and  well developed. General: Alert and oriented x3, cooperative and pleasant, no acute distress. Head: normocephalic, atraumatic, neck supple. Eyes: EOMI.  Musculoskeletal: Right hip exam: Painful and limited right hip range of motion with hip flexion internal rotation to 5 degrees and pelvic tilting. Active hip flexion limited compared to her left hip with slight external rotation contracture Bilateral knee exams: No palpable effusions, warmth erythema Tenderness with crepitation over the anterior aspect the knees Flexion to 120 degrees with tightness and crepitation  Full knee extension without palpable defect   Calves soft and nontender. Motor function intact in LE. Strength 5/5 LE bilaterally. Neuro: Distal pulses 2+. Sensation to light touch intact in LE.  Objective:  Physical Exam Well nourished and well developed. General: Alert and oriented x3, cooperative and pleasant, no acute distress. Head: normocephalic, atraumatic, neck supple. Eyes: EOMI.  Musculoskeletal: Right hip exam: Painful and limited right hip range of motion with hip flexion internal rotation to 5 degrees and pelvic tilting. Active hip flexion limited compared to her left hip with slight external rotation contracture Bilateral knee exams: No palpable effusions, warmth erythema Tenderness with crepitation over the anterior aspect the knees Flexion to 120 degrees with tightness and crepitation Full knee extension without palpable defect   Calves soft and nontender. Motor function intact in LE. Strength 5/5 LE bilaterally. Neuro: Distal pulses 2+. Sensation to light touch intact in LE.  Vital signs in last 24 hours:    Labs:   Estimated body mass index is 33.47 kg/m as calculated from the following:   Height as of 08/16/21: 5\' 4"  (1.626 m).   Weight as of 08/16/21: 88.5 kg.   Imaging Review Plain radiographs demonstrate severe degenerative joint disease of the right hip(s). The bone quality  appears to be adequate for age and reported activity level.      Assessment/Plan:  End stage arthritis, right hip(s)  The patient history, physical examination, clinical judgement of the provider and imaging studies are consistent with end stage degenerative joint disease of the right hip(s) and total hip arthroplasty is deemed medically necessary. The treatment options including medical management, injection therapy, arthroscopy and arthroplasty were discussed at length. The risks and benefits of total hip arthroplasty were presented and reviewed. The risks due to aseptic loosening, infection, stiffness, dislocation/subluxation,  thromboembolic complications and other imponderables were discussed.  The patient acknowledged the explanation, agreed to proceed with the plan and consent was signed. Patient is being admitted for inpatient treatment for surgery, pain control, PT, OT, prophylactic antibiotics, VTE prophylaxis, progressive ambulation and ADL's and discharge planning.The patient is planning to be discharged  home.  Therapy Plans: HEP Disposition: Home with brother & sister Planned DVT Prophylaxis: aspirin 81mg  BID DME needed: none PCP: Dr. Suzanna Obey, clearance received Rheumatologist: Dr. Dossie Der TXA: IV Allergies: albuterol pump - irritates lungs, PCN - skin irritation, vicodin - vomiting Anesthesia Concerns: none BMI: 34 Last HgbA1c: pre-diabetic   Other: - oxycodone, celebrex - Hx of fibromyalgia - on lyrica & flexeril ( may send 5 mg tablets if needed or use Robaxin - Hx of plaque psoriasis (Cosyntx & methotrexate) - stopped mtx 2 weeks ago - Hx of Asthma/COPD   Costella Hatcher, PA-C Orthopedic Surgery [facility] [place] [phone]

## 2021-08-25 ENCOUNTER — Other Ambulatory Visit: Payer: Self-pay | Admitting: Orthopedic Surgery

## 2021-08-25 LAB — SARS CORONAVIRUS 2 (TAT 6-24 HRS): SARS Coronavirus 2: NEGATIVE

## 2021-08-29 ENCOUNTER — Other Ambulatory Visit: Payer: Self-pay

## 2021-08-29 ENCOUNTER — Ambulatory Visit (HOSPITAL_COMMUNITY): Payer: Medicare Other | Admitting: Physician Assistant

## 2021-08-29 ENCOUNTER — Encounter (HOSPITAL_COMMUNITY): Payer: Self-pay | Admitting: Orthopedic Surgery

## 2021-08-29 ENCOUNTER — Observation Stay (HOSPITAL_COMMUNITY)
Admission: RE | Admit: 2021-08-29 | Discharge: 2021-08-31 | Disposition: A | Discharge status: Home / Self Care | Payer: Medicare Other | Attending: Orthopedic Surgery | Admitting: Orthopedic Surgery

## 2021-08-29 ENCOUNTER — Ambulatory Visit (HOSPITAL_COMMUNITY): Payer: Medicare Other | Admitting: Anesthesiology

## 2021-08-29 ENCOUNTER — Encounter (HOSPITAL_COMMUNITY)
Admission: RE | Disposition: A | Discharge status: Home / Self Care | Payer: Self-pay | Source: Home / Self Care | Attending: Orthopedic Surgery

## 2021-08-29 ENCOUNTER — Ambulatory Visit (HOSPITAL_COMMUNITY): Payer: Medicare Other

## 2021-08-29 ENCOUNTER — Observation Stay (HOSPITAL_COMMUNITY): Payer: Medicare Other

## 2021-08-29 DIAGNOSIS — Z96641 Presence of right artificial hip joint: Secondary | ICD-10-CM

## 2021-08-29 DIAGNOSIS — J449 Chronic obstructive pulmonary disease, unspecified: Secondary | ICD-10-CM | POA: Insufficient documentation

## 2021-08-29 DIAGNOSIS — M1611 Unilateral primary osteoarthritis, right hip: Secondary | ICD-10-CM | POA: Diagnosis present

## 2021-08-29 DIAGNOSIS — N189 Chronic kidney disease, unspecified: Secondary | ICD-10-CM | POA: Insufficient documentation

## 2021-08-29 DIAGNOSIS — Z79899 Other long term (current) drug therapy: Secondary | ICD-10-CM | POA: Insufficient documentation

## 2021-08-29 DIAGNOSIS — Z419 Encounter for procedure for purposes other than remedying health state, unspecified: Secondary | ICD-10-CM

## 2021-08-29 DIAGNOSIS — Z96649 Presence of unspecified artificial hip joint: Secondary | ICD-10-CM

## 2021-08-29 HISTORY — PX: TOTAL HIP ARTHROPLASTY: SHX124

## 2021-08-29 LAB — TYPE AND SCREEN
ABO/RH(D): A POS
Antibody Screen: NEGATIVE

## 2021-08-29 LAB — ABO/RH: ABO/RH(D): A POS

## 2021-08-29 SURGERY — ARTHROPLASTY, HIP, TOTAL, ANTERIOR APPROACH
Anesthesia: Spinal | Site: Hip | Laterality: Right

## 2021-08-29 MED ORDER — OXYCODONE HCL 5 MG/5ML PO SOLN: 5.0000 mg | Freq: Once | ORAL | Status: AC | PRN

## 2021-08-29 MED ORDER — PROPOFOL 10 MG/ML IV BOLUS
INTRAVENOUS | Status: DC | PRN
  Administered 2021-08-29: 30 mg via INTRAVENOUS
  Administered 2021-08-29 (×2): 20 mg via INTRAVENOUS
  Administered 2021-08-29 (×2): 30 mg via INTRAVENOUS
  Administered 2021-08-29: 20 mg via INTRAVENOUS

## 2021-08-29 MED ORDER — METOCLOPRAMIDE HCL 5 MG PO TABS: 5.0000 mg | ORAL_TABLET | Freq: Three times a day (TID) | ORAL | Status: DC | PRN

## 2021-08-29 MED ORDER — AMISULPRIDE (ANTIEMETIC) 5 MG/2ML IV SOLN: 10.0000 mg | Freq: Once | INTRAVENOUS | Status: DC | PRN

## 2021-08-29 MED ORDER — FOLIC ACID 1 MG PO TABS
1.0000 mg | ORAL_TABLET | Freq: Every morning | ORAL | Status: DC
  Administered 2021-08-30 – 2021-08-31 (×2): 1 mg via ORAL
  Filled 2021-08-29 (×2): qty 1

## 2021-08-29 MED ORDER — CEFAZOLIN SODIUM-DEXTROSE 2-4 GM/100ML-% IV SOLN
2.0000 g | Freq: Four times a day (QID) | INTRAVENOUS | Status: AC
  Administered 2021-08-29 – 2021-08-30 (×2): 2 g via INTRAVENOUS
  Filled 2021-08-29 (×2): qty 100

## 2021-08-29 MED ORDER — ONDANSETRON HCL 4 MG/2ML IJ SOLN
INTRAMUSCULAR | Status: AC
  Filled 2021-08-29: qty 2

## 2021-08-29 MED ORDER — ATORVASTATIN CALCIUM 10 MG PO TABS
10.0000 mg | ORAL_TABLET | Freq: Every morning | ORAL | Status: DC
  Administered 2021-08-30 – 2021-08-31 (×2): 10 mg via ORAL
  Filled 2021-08-29 (×2): qty 1

## 2021-08-29 MED ORDER — ACETAMINOPHEN 325 MG PO TABS
325.0000 mg | ORAL_TABLET | Freq: Four times a day (QID) | ORAL | Status: DC | PRN
  Administered 2021-08-30 – 2021-08-31 (×3): 650 mg via ORAL
  Filled 2021-08-29 (×3): qty 2

## 2021-08-29 MED ORDER — DIPHENHYDRAMINE HCL 12.5 MG/5ML PO ELIX: 12.5000 mg | ORAL_SOLUTION | ORAL | Status: DC | PRN

## 2021-08-29 MED ORDER — METHOCARBAMOL 500 MG PO TABS
500.0000 mg | ORAL_TABLET | Freq: Four times a day (QID) | ORAL | Status: DC | PRN
  Administered 2021-08-30 – 2021-08-31 (×2): 500 mg via ORAL
  Filled 2021-08-29 (×3): qty 1

## 2021-08-29 MED ORDER — OXYCODONE HCL 5 MG PO TABS
5.0000 mg | ORAL_TABLET | Freq: Once | ORAL | Status: AC | PRN
  Administered 2021-08-29: 5 mg via ORAL

## 2021-08-29 MED ORDER — FENTANYL CITRATE PF 50 MCG/ML IJ SOSY
PREFILLED_SYRINGE | INTRAMUSCULAR | Status: AC
  Filled 2021-08-29: qty 1

## 2021-08-29 MED ORDER — OXYCODONE HCL 5 MG PO TABS
5.0000 mg | ORAL_TABLET | ORAL | Status: DC | PRN
  Filled 2021-08-29: qty 2

## 2021-08-29 MED ORDER — 0.9 % SODIUM CHLORIDE (POUR BTL) OPTIME
TOPICAL | Status: DC | PRN
  Administered 2021-08-29: 1000 mL

## 2021-08-29 MED ORDER — PROPOFOL 500 MG/50ML IV EMUL
INTRAVENOUS | Status: AC
  Filled 2021-08-29: qty 50

## 2021-08-29 MED ORDER — ALBUTEROL SULFATE (2.5 MG/3ML) 0.083% IN NEBU: 3.0000 mL | INHALATION_SOLUTION | Freq: Four times a day (QID) | RESPIRATORY_TRACT | Status: DC | PRN

## 2021-08-29 MED ORDER — OXYCODONE HCL 5 MG PO TABS
ORAL_TABLET | ORAL | Status: AC
  Filled 2021-08-29: qty 1

## 2021-08-29 MED ORDER — ONDANSETRON HCL 4 MG PO TABS: 4.0000 mg | ORAL_TABLET | Freq: Four times a day (QID) | ORAL | Status: DC | PRN

## 2021-08-29 MED ORDER — CYCLOBENZAPRINE HCL 10 MG PO TABS
20.0000 mg | ORAL_TABLET | Freq: Every day | ORAL | Status: DC
  Administered 2021-08-29 – 2021-08-30 (×2): 20 mg via ORAL
  Filled 2021-08-29 (×2): qty 2

## 2021-08-29 MED ORDER — MONTELUKAST SODIUM 10 MG PO TABS
10.0000 mg | ORAL_TABLET | Freq: Every morning | ORAL | Status: DC
  Administered 2021-08-30 – 2021-08-31 (×2): 10 mg via ORAL
  Filled 2021-08-29 (×2): qty 1

## 2021-08-29 MED ORDER — ONDANSETRON HCL 4 MG/2ML IJ SOLN: 4.0000 mg | Freq: Four times a day (QID) | INTRAMUSCULAR | Status: DC | PRN

## 2021-08-29 MED ORDER — FENTANYL CITRATE PF 50 MCG/ML IJ SOSY
25.0000 ug | PREFILLED_SYRINGE | INTRAMUSCULAR | Status: DC | PRN
  Administered 2021-08-29: 50 ug via INTRAVENOUS

## 2021-08-29 MED ORDER — FERROUS SULFATE 325 (65 FE) MG PO TABS
325.0000 mg | ORAL_TABLET | Freq: Three times a day (TID) | ORAL | Status: DC
  Administered 2021-08-29 – 2021-08-31 (×5): 325 mg via ORAL
  Filled 2021-08-29 (×5): qty 1

## 2021-08-29 MED ORDER — FLUOXETINE HCL 20 MG PO CAPS
40.0000 mg | ORAL_CAPSULE | Freq: Every morning | ORAL | Status: DC
  Administered 2021-08-30 – 2021-08-31 (×2): 40 mg via ORAL
  Filled 2021-08-29 (×2): qty 2

## 2021-08-29 MED ORDER — BUPIVACAINE IN DEXTROSE 0.75-8.25 % IT SOLN
INTRATHECAL | Status: DC | PRN
  Administered 2021-08-29: 1.8 mL via INTRATHECAL

## 2021-08-29 MED ORDER — CELECOXIB 200 MG PO CAPS
200.0000 mg | ORAL_CAPSULE | Freq: Two times a day (BID) | ORAL | Status: DC
  Administered 2021-08-29: 200 mg via ORAL
  Filled 2021-08-29: qty 1

## 2021-08-29 MED ORDER — MOMETASONE FURO-FORMOTEROL FUM 200-5 MCG/ACT IN AERO
2.0000 | INHALATION_SPRAY | Freq: Two times a day (BID) | RESPIRATORY_TRACT | Status: DC
  Administered 2021-08-30 – 2021-08-31 (×3): 2 via RESPIRATORY_TRACT
  Filled 2021-08-29: qty 8.8

## 2021-08-29 MED ORDER — PROPOFOL 1000 MG/100ML IV EMUL
INTRAVENOUS | Status: AC
  Filled 2021-08-29: qty 100

## 2021-08-29 MED ORDER — MENTHOL 3 MG MT LOZG: 1.0000 | LOZENGE | OROMUCOSAL | Status: DC | PRN

## 2021-08-29 MED ORDER — OXYBUTYNIN CHLORIDE ER 5 MG PO TB24
5.0000 mg | ORAL_TABLET | Freq: Every day | ORAL | Status: DC
  Administered 2021-08-29 – 2021-08-30 (×2): 5 mg via ORAL
  Filled 2021-08-29 (×2): qty 1

## 2021-08-29 MED ORDER — METHOCARBAMOL 500 MG IVPB - SIMPLE MED
500.0000 mg | Freq: Four times a day (QID) | INTRAVENOUS | Status: DC | PRN
  Administered 2021-08-29: 500 mg via INTRAVENOUS
  Filled 2021-08-29: qty 50

## 2021-08-29 MED ORDER — TOPIRAMATE 100 MG PO TABS
200.0000 mg | ORAL_TABLET | Freq: Every day | ORAL | Status: DC
  Administered 2021-08-29 – 2021-08-30 (×2): 200 mg via ORAL
  Filled 2021-08-29 (×2): qty 2

## 2021-08-29 MED ORDER — OXYCODONE HCL 5 MG PO TABS
10.0000 mg | ORAL_TABLET | ORAL | Status: DC | PRN
  Administered 2021-08-29 – 2021-08-31 (×5): 15 mg via ORAL
  Administered 2021-08-31: 10 mg via ORAL
  Filled 2021-08-29 (×5): qty 3

## 2021-08-29 MED ORDER — PREGABALIN 75 MG PO CAPS
150.0000 mg | ORAL_CAPSULE | Freq: Every day | ORAL | Status: DC
  Administered 2021-08-29 – 2021-08-30 (×2): 150 mg via ORAL
  Filled 2021-08-29 (×2): qty 2

## 2021-08-29 MED ORDER — IPRATROPIUM BROMIDE 0.02 % IN SOLN: 0.5000 mg | Freq: Four times a day (QID) | RESPIRATORY_TRACT | Status: DC | PRN

## 2021-08-29 MED ORDER — ONDANSETRON HCL 4 MG/2ML IJ SOLN
INTRAMUSCULAR | Status: DC | PRN
  Administered 2021-08-29: 4 mg via INTRAVENOUS

## 2021-08-29 MED ORDER — NORTRIPTYLINE HCL 10 MG PO CAPS
20.0000 mg | ORAL_CAPSULE | Freq: Every day | ORAL | Status: DC
  Administered 2021-08-29 – 2021-08-30 (×2): 20 mg via ORAL
  Filled 2021-08-29 (×3): qty 2

## 2021-08-29 MED ORDER — POVIDONE-IODINE 10 % EX SWAB
2.0000 "application " | Freq: Once | CUTANEOUS | Status: AC
  Administered 2021-08-29: 2 via TOPICAL

## 2021-08-29 MED ORDER — PANTOPRAZOLE SODIUM 40 MG PO TBEC
40.0000 mg | DELAYED_RELEASE_TABLET | Freq: Every day | ORAL | Status: DC
  Administered 2021-08-30 – 2021-08-31 (×2): 40 mg via ORAL
  Filled 2021-08-29 (×2): qty 1

## 2021-08-29 MED ORDER — ONDANSETRON HCL 4 MG/2ML IJ SOLN: 4.0000 mg | Freq: Once | INTRAMUSCULAR | Status: DC | PRN

## 2021-08-29 MED ORDER — PHENOL 1.4 % MT LIQD: 1.0000 | OROMUCOSAL | Status: DC | PRN

## 2021-08-29 MED ORDER — STERILE WATER FOR IRRIGATION IR SOLN
Status: DC | PRN
  Administered 2021-08-29: 2000 mL

## 2021-08-29 MED ORDER — LACTATED RINGERS IV SOLN: INTRAVENOUS | Status: DC

## 2021-08-29 MED ORDER — FAMOTIDINE 20 MG PO TABS
40.0000 mg | ORAL_TABLET | Freq: Every day | ORAL | Status: DC
  Administered 2021-08-29 – 2021-08-30 (×2): 40 mg via ORAL
  Filled 2021-08-29 (×2): qty 2

## 2021-08-29 MED ORDER — ASPIRIN 81 MG PO CHEW
81.0000 mg | CHEWABLE_TABLET | Freq: Two times a day (BID) | ORAL | Status: DC
  Administered 2021-08-29 – 2021-08-31 (×4): 81 mg via ORAL
  Filled 2021-08-29 (×4): qty 1

## 2021-08-29 MED ORDER — CEFAZOLIN SODIUM-DEXTROSE 2-4 GM/100ML-% IV SOLN
2.0000 g | INTRAVENOUS | Status: AC
  Administered 2021-08-29: 2 g via INTRAVENOUS
  Filled 2021-08-29: qty 100

## 2021-08-29 MED ORDER — IPRATROPIUM BROMIDE HFA 17 MCG/ACT IN AERS: 2.0000 | INHALATION_SPRAY | Freq: Four times a day (QID) | RESPIRATORY_TRACT | Status: DC | PRN

## 2021-08-29 MED ORDER — SODIUM CHLORIDE 0.9 % IV SOLN: INTRAVENOUS | Status: DC

## 2021-08-29 MED ORDER — MIDAZOLAM HCL 2 MG/2ML IJ SOLN
INTRAMUSCULAR | Status: AC
  Filled 2021-08-29: qty 2

## 2021-08-29 MED ORDER — BISACODYL 10 MG RE SUPP: 10.0000 mg | Freq: Every day | RECTAL | Status: DC | PRN

## 2021-08-29 MED ORDER — DEXAMETHASONE SODIUM PHOSPHATE 10 MG/ML IJ SOLN
10.0000 mg | Freq: Once | INTRAMUSCULAR | Status: AC
  Administered 2021-08-30: 10 mg via INTRAVENOUS
  Filled 2021-08-29: qty 1

## 2021-08-29 MED ORDER — POLYETHYLENE GLYCOL 3350 17 G PO PACK: 17.0000 g | PACK | Freq: Every day | ORAL | Status: DC | PRN

## 2021-08-29 MED ORDER — PROPOFOL 500 MG/50ML IV EMUL
INTRAVENOUS | Status: DC | PRN
Start: 2021-08-29 — End: 2021-08-29
  Administered 2021-08-29: 100 ug/kg/min via INTRAVENOUS

## 2021-08-29 MED ORDER — METOCLOPRAMIDE HCL 5 MG/ML IJ SOLN: 5.0000 mg | Freq: Three times a day (TID) | INTRAMUSCULAR | Status: DC | PRN

## 2021-08-29 MED ORDER — DEXAMETHASONE SODIUM PHOSPHATE 10 MG/ML IJ SOLN
INTRAMUSCULAR | Status: AC
  Filled 2021-08-29: qty 1

## 2021-08-29 MED ORDER — DOCUSATE SODIUM 100 MG PO CAPS
100.0000 mg | ORAL_CAPSULE | Freq: Two times a day (BID) | ORAL | Status: DC
  Administered 2021-08-30 – 2021-08-31 (×3): 100 mg via ORAL
  Filled 2021-08-29 (×3): qty 1

## 2021-08-29 MED ORDER — ACETAMINOPHEN 500 MG PO TABS
1000.0000 mg | ORAL_TABLET | Freq: Once | ORAL | Status: AC
  Administered 2021-08-29: 1000 mg via ORAL
  Filled 2021-08-29: qty 2

## 2021-08-29 MED ORDER — FENTANYL CITRATE (PF) 100 MCG/2ML IJ SOLN
INTRAMUSCULAR | Status: AC
  Filled 2021-08-29: qty 2

## 2021-08-29 MED ORDER — LORATADINE 10 MG PO TABS
10.0000 mg | ORAL_TABLET | Freq: Every day | ORAL | Status: DC
  Administered 2021-08-30 – 2021-08-31 (×2): 10 mg via ORAL
  Filled 2021-08-29 (×2): qty 1

## 2021-08-29 MED ORDER — TRANEXAMIC ACID-NACL 1000-0.7 MG/100ML-% IV SOLN
1000.0000 mg | INTRAVENOUS | Status: AC
  Administered 2021-08-29: 1000 mg via INTRAVENOUS
  Filled 2021-08-29: qty 100

## 2021-08-29 MED ORDER — TRANEXAMIC ACID-NACL 1000-0.7 MG/100ML-% IV SOLN
1000.0000 mg | Freq: Once | INTRAVENOUS | Status: AC
  Administered 2021-08-29: 1000 mg via INTRAVENOUS
  Filled 2021-08-29: qty 100

## 2021-08-29 MED ORDER — LINACLOTIDE 72 MCG PO CAPS
72.0000 ug | ORAL_CAPSULE | Freq: Every day | ORAL | Status: DC
  Administered 2021-08-30 – 2021-08-31 (×2): 72 ug via ORAL
  Filled 2021-08-29 (×2): qty 1

## 2021-08-29 MED ORDER — DEXAMETHASONE SODIUM PHOSPHATE 10 MG/ML IJ SOLN
8.0000 mg | Freq: Once | INTRAMUSCULAR | Status: AC
Start: 2021-08-29 — End: 2021-08-29
  Administered 2021-08-29: 8 mg via INTRAVENOUS

## 2021-08-29 MED ORDER — METHOCARBAMOL 500 MG IVPB - SIMPLE MED
INTRAVENOUS | Status: AC
  Filled 2021-08-29: qty 50

## 2021-08-29 MED ORDER — HYDROMORPHONE HCL 1 MG/ML IJ SOLN
0.5000 mg | INTRAMUSCULAR | Status: DC | PRN
  Administered 2021-08-29 – 2021-08-30 (×2): 1 mg via INTRAVENOUS
  Filled 2021-08-29 (×3): qty 1

## 2021-08-29 SURGICAL SUPPLY — 44 items
ADH SKN CLS APL DERMABOND .7 (GAUZE/BANDAGES/DRESSINGS) ×1
BAG COUNTER SPONGE SURGICOUNT (BAG) IMPLANT
BAG DECANTER FOR FLEXI CONT (MISCELLANEOUS) IMPLANT
BAG SPEC THK2 15X12 ZIP CLS (MISCELLANEOUS)
BAG SPNG CNTER NS LX DISP (BAG)
BAG ZIPLOCK 12X15 (MISCELLANEOUS) IMPLANT
BLADE SAG 18X100X1.27 (BLADE) ×2 IMPLANT
COVER PERINEAL POST (MISCELLANEOUS) ×2 IMPLANT
COVER SURGICAL LIGHT HANDLE (MISCELLANEOUS) ×2 IMPLANT
CUP ACET PINNACLE SECTR 50MM (Hips) IMPLANT
DERMABOND ADVANCED (GAUZE/BANDAGES/DRESSINGS) ×1
DERMABOND ADVANCED .7 DNX12 (GAUZE/BANDAGES/DRESSINGS) ×1 IMPLANT
DRAPE FOOT SWITCH (DRAPES) ×2 IMPLANT
DRAPE STERI IOBAN 125X83 (DRAPES) ×2 IMPLANT
DRAPE U-SHAPE 47X51 STRL (DRAPES) ×4 IMPLANT
DRESSING AQUACEL AG SP 3.5X10 (GAUZE/BANDAGES/DRESSINGS) ×1 IMPLANT
DRSG AQUACEL AG SP 3.5X10 (GAUZE/BANDAGES/DRESSINGS) ×2
DURAPREP 26ML APPLICATOR (WOUND CARE) ×2 IMPLANT
ELECT REM PT RETURN 15FT ADLT (MISCELLANEOUS) ×2 IMPLANT
ELIMINATOR HOLE APEX DEPUY (Hips) ×1 IMPLANT
FEM STEM 12/14 TAPER SZ 4 HIP (Orthopedic Implant) ×2 IMPLANT
FEMORAL STEM 12/14 TPR SZ4 HIP (Orthopedic Implant) IMPLANT
GLOVE SURG ENC MOIS LTX SZ6 (GLOVE) ×2 IMPLANT
GLOVE SURG ENC MOIS LTX SZ7 (GLOVE) ×2 IMPLANT
GLOVE SURG UNDER LTX SZ6.5 (GLOVE) ×2 IMPLANT
GLOVE SURG UNDER POLY LF SZ7.5 (GLOVE) ×2 IMPLANT
GOWN STRL REUS W/TWL LRG LVL3 (GOWN DISPOSABLE) ×4 IMPLANT
HEAD FEMORAL 32 CERAMIC (Hips) ×1 IMPLANT
HOLDER FOLEY CATH W/STRAP (MISCELLANEOUS) ×2 IMPLANT
KIT TURNOVER KIT A (KITS) IMPLANT
LINER ACET PNNCL PLUS4 NEUTRAL (Hips) IMPLANT
PACK ANTERIOR HIP CUSTOM (KITS) ×2 IMPLANT
PINNACLE PLUS 4 NEUTRAL (Hips) ×2 IMPLANT
PINNACLE SECTOR CUP 50MM (Hips) ×2 IMPLANT
SCREW 6.5MMX30MM (Screw) ×1 IMPLANT
SUT MNCRL AB 4-0 PS2 18 (SUTURE) ×2 IMPLANT
SUT STRATAFIX 0 PDS 27 VIOLET (SUTURE) ×2
SUT VIC AB 1 CT1 36 (SUTURE) ×6 IMPLANT
SUT VIC AB 2-0 CT1 27 (SUTURE) ×4
SUT VIC AB 2-0 CT1 TAPERPNT 27 (SUTURE) ×2 IMPLANT
SUTURE STRATFX 0 PDS 27 VIOLET (SUTURE) ×1 IMPLANT
TRAY FOLEY MTR SLVR 16FR STAT (SET/KITS/TRAYS/PACK) IMPLANT
TUBE SUCTION HIGH CAP CLEAR NV (SUCTIONS) ×2 IMPLANT
WATER STERILE IRR 1000ML POUR (IV SOLUTION) ×2 IMPLANT

## 2021-08-29 NOTE — Interval H&P Note (Signed)
History and Physical Interval Note:  08/29/2021 1:17 PM  Christine Bradley  has presented today for surgery, with the diagnosis of Right hip osteoarthritis.  The various methods of treatment have been discussed with the patient and family. After consideration of risks, benefits and other options for treatment, the patient has consented to  Procedure(s): TOTAL HIP ARTHROPLASTY ANTERIOR APPROACH (Right) as a surgical intervention.  The patient's history has been reviewed, patient examined, no change in status, stable for surgery.  I have reviewed the patient's chart and labs.  Questions were answered to the patient's satisfaction.     [name]

## 2021-08-29 NOTE — Anesthesia Procedure Notes (Addendum)
Spinal  Patient location during procedure: OR Start time: 08/29/2021 2:22 PM End time: 08/29/2021 2:32 PM Reason for block: surgical anesthesia Staffing Performed: anesthesiologist  Anesthesiologist: Lidia Collum, MD Resident/CRNA: Milford Cage, CRNA Preanesthetic Checklist Completed: patient identified, IV checked, site marked, risks and benefits discussed, surgical consent, monitors and equipment checked, pre-op evaluation and timeout performed Spinal Block Patient position: sitting Prep: DuraPrep Patient monitoring: heart rate, cardiac monitor, continuous pulse ox and blood pressure Approach: midline Location: L3-4 Injection technique: single-shot Needle Needle type: Pencan  Needle gauge: 24 G Needle length: 10 cm Assessment Sensory level: T6 Events: CSF return and second provider Additional Notes Functioning IV was confirmed and monitors were applied. Sterile prep and drape, including hand hygiene and sterile gloves were used. The patient was positioned and the spine was prepped. The skin was anesthetized with lidocaine.  Free flow of clear CSF was obtained prior to injecting local anesthetic into the CSF.  The spinal needle aspirated freely following injection.  The needle was carefully withdrawn.  The patient tolerated the procedure well.

## 2021-08-29 NOTE — Anesthesia Postprocedure Evaluation (Signed)
Anesthesia Post Note  Patient: TIAH HECKEL  Procedure(s) Performed: TOTAL HIP ARTHROPLASTY ANTERIOR APPROACH (Right: Hip)     Patient location during evaluation: PACU Anesthesia Type: Spinal Level of consciousness: oriented and awake and alert Pain management: pain level controlled Vital Signs Assessment: post-procedure vital signs reviewed and stable Respiratory status: spontaneous breathing, respiratory function stable and nonlabored ventilation Cardiovascular status: blood pressure returned to baseline and stable Postop Assessment: no headache, no backache, no apparent nausea or vomiting and spinal receding Anesthetic complications: no   No notable events documented.  Last Vitals:  Vitals:   08/29/21 1815 08/29/21 1837  BP: 136/88 (!) 151/85  Pulse: 84 85  Resp: 13 16  Temp:  36.7 C  SpO2: 100% 96%    Last Pain:  Vitals:   08/29/21 1837  TempSrc: Oral  PainSc:                  Lidia Collum

## 2021-08-29 NOTE — Transfer of Care (Signed)
Immediate Anesthesia Transfer of Care Note  Patient: Christine Bradley  Procedure(s) Performed: TOTAL HIP ARTHROPLASTY ANTERIOR APPROACH (Right: Hip)  Patient Location: PACU  Anesthesia Type:Spinal  Level of Consciousness: awake  Airway & Oxygen Therapy: Patient Spontanous Breathing and Patient connected to face mask oxygen  Post-op Assessment: Report given to RN and Post -op Vital signs reviewed and stable  Post vital signs: Reviewed and stable  Last Vitals:  Vitals Value Taken Time  BP 137/82 08/29/21 1616  Temp    Pulse 81 08/29/21 1619  Resp 13 08/29/21 1619  SpO2 92 % 08/29/21 1619  Vitals shown include unvalidated device data.  Last Pain:  Vitals:   08/29/21 1226  TempSrc:   PainSc: 6       Patients Stated Pain Goal: 4 (46/27/03 5009)  Complications: No notable events documented.

## 2021-08-29 NOTE — Discharge Instructions (Signed)

## 2021-08-29 NOTE — Op Note (Signed)
NAME:  Christine Bradley.: [id number]      MEDICAL RECORD NO.: [id number]      FACILITY:  [facility]      PHYSICIAN:  [name]  DATE OF BIRTH:  1965-11-17     DATE OF PROCEDURE:  08/29/2021                                 OPERATIVE REPORT         PREOPERATIVE DIAGNOSIS: Right  hip osteoarthritis.      POSTOPERATIVE DIAGNOSIS:  Right hip osteoarthritis.      PROCEDURE:  Right total hip replacement through an anterior approach   utilizing DePuy THR system, component size 50 mm pinnacle cup, a size 32+4 neutral   Altrex liner, a size 4 Hi Actis stem with a 32+1 delta ceramic   ball.      SURGEON:  Pietro Cassis. Alvan Dame, M.D.      ASSISTANT:  Costella Hatcher, PA-C     ANESTHESIA:  Spinal.      SPECIMENS:  None.      COMPLICATIONS:  None.      BLOOD LOSS:  400 cc     DRAINS:  None.      INDICATION OF THE PROCEDURE:  Christine Bradley is a 55 y.o. female who had   presented to office for evaluation of right hip pain.  Radiographs revealed   progressive degenerative changes with bone-on-bone   articulation of the  hip joint, including subchondral cystic changes and osteophytes.  The patient had painful limited range of   motion significantly affecting their overall quality of life and function.  The patient was failing to    respond to conservative measures including medications and/or injections and activity modification and at this point was ready   to proceed with more definitive measures.  Consent was obtained for   benefit of pain relief.  Specific risks of infection, DVT, component   failure, dislocation, neurovascular injury, and need for revision surgery were reviewed in the office as well discussion of   the anterior versus posterior approach were reviewed.     PROCEDURE IN DETAIL:  The patient was brought to operative theater.   Once adequate anesthesia, preoperative antibiotics, 2 gm of Ancef, 1 gm of Tranexamic Acid, and 10 mg of  Decadron were administered, the patient was positioned supine on the [employer] table.  Once the patient was safely positioned with adequate padding of boney prominences we predraped out the hip, and used fluoroscopy to confirm orientation of the pelvis.      The right hip was then prepped and draped from proximal iliac crest to   mid thigh with a shower curtain technique.      Time-out was performed identifying the patient, planned procedure, and the appropriate extremity.     An incision was then made 2 cm lateral to the   anterior superior iliac spine extending over the orientation of the   tensor fascia lata muscle and sharp dissection was carried down to the   fascia of the muscle.      The fascia was then incised.  The muscle belly was identified and swept   laterally and retractor placed along the superior neck.  Following   cauterization of the circumflex vessels and removing some  pericapsular   fat, a second cobra retractor was placed on the inferior neck.  A T-capsulotomy was made along the line of the   superior neck to the trochanteric fossa, then extended proximally and   distally.  Tag sutures were placed and the retractors were then placed   intracapsular.  We then identified the trochanteric fossa and   orientation of my neck cut and then made a neck osteotomy with the femur on traction.  The femoral   head was removed without difficulty or complication.  Traction was let   off and retractors were placed posterior and anterior around the   acetabulum.      The labrum and foveal tissue were debrided.  I began reaming with a 45 mm   reamer and reamed up to 49 mm reamer with good bony bed preparation and a 59 mm  cup was chosen.  The final 59 mm Pinnacle cup was then impacted under fluoroscopy to confirm the depth of penetration and orientation with respect to   Abduction and forward flexion.  A screw was placed into the ilium followed by the hole eliminator.  The final    32+4+4 neutral Altrex liner was impacted with good visualized rim fit.  The cup was positioned anatomically within the acetabular portion of the pelvis.      At this point, the femur was rolled to 100 degrees.  Further capsule was   released off the inferior aspect of the femoral neck.  I then   released the superior capsule proximally.  With the leg in a neutral position the hook was placed laterally   along the femur under the vastus lateralis origin and elevated manually and then held in position using the hook attachment on the bed.  The leg was then extended and adducted with the leg rolled to 100   degrees of external rotation.  Retractors were placed along the medial calcar and posteriorly over the greater trochanter.  Once the proximal femur was fully   exposed, I used a box osteotome to set orientation.  I then began   broaching with the starting chili pepper broach and passed this by hand and then broached up to 4.  With the 4 broach in place I chose a high offset neck and did several trial reductions.  The offset was appropriate, leg lengths   appeared to be equal best matched with the +1 head ball trial confirmed radiographically.   Given these findings, I went ahead and dislocated the hip, repositioned all   retractors and positioned the right hip in the extended and abducted position.  The final 4 Hi Actis stem was   chosen and it was impacted down to the level of neck cut.  Based on this   and the trial reductions, a final 32+1 delta ceramic ball was chosen and   impacted onto a clean and dry trunnion, and the hip was reduced.  The   hip had been irrigated throughout the case again at this point.  I did   reapproximate the superior capsular leaflet to the anterior leaflet   using #1 Vicryl.  The fascia of the   tensor fascia lata muscle was then reapproximated using #1 Vicryl and #0 Stratafix sutures.  The   remaining wound was closed with 2-0 Vicryl and running 4-0 Monocryl.    The hip was cleaned, dried, and dressed sterilely using Dermabond and   Aquacel dressing.  The patient was then brought   to  recovery room in stable condition tolerating the procedure well.    Costella Hatcher, PA-C was present for the entirety of the case involved from   preoperative positioning, perioperative retractor management, general   facilitation of the case, as well as primary wound closure as assistant.            Pietro Cassis Alvan Dame, M.D.        08/29/2021 4:04 PM

## 2021-08-29 NOTE — Anesthesia Preprocedure Evaluation (Signed)
Anesthesia Evaluation  Patient identified by MRN, date of birth, ID band Patient awake    Reviewed: Allergy & Precautions, NPO status , Patient's Chart, lab work & pertinent test results  History of Anesthesia Complications Negative for: history of anesthetic complications  Airway Mallampati: II  TM Distance: >3 FB Neck ROM: Full    Dental  (+) Teeth Intact   Pulmonary COPD,    Pulmonary exam normal        Cardiovascular negative cardio ROS Normal cardiovascular exam     Neuro/Psych  Headaches, Anxiety    GI/Hepatic Neg liver ROS, GERD  ,  Endo/Other  negative endocrine ROS  Renal/GU negative Renal ROS  negative genitourinary   Musculoskeletal  (+) Arthritis , Fibromyalgia -  Abdominal   Peds  Hematology negative hematology ROS (+)   Anesthesia Other Findings   Reproductive/Obstetrics                             Anesthesia Physical Anesthesia Plan  ASA: 2  Anesthesia Plan: Spinal   Post-op Pain Management: Tylenol PO (pre-op) and Toradol IV (intra-op)   Induction:   PONV Risk Score and Plan: 2 and Propofol infusion, Treatment may vary due to age or medical condition, Ondansetron and TIVA  Airway Management Planned: Nasal Cannula and Simple Face Mask  Additional Equipment: None  Intra-op Plan:   Post-operative Plan:   Informed Consent: I have reviewed the patients History and Physical, chart, labs and discussed the procedure including the risks, benefits and alternatives for the proposed anesthesia with the patient or authorized representative who has indicated his/her understanding and acceptance.       Plan Discussed with:   Anesthesia Plan Comments:         Anesthesia Quick Evaluation

## 2021-08-30 DIAGNOSIS — M1611 Unilateral primary osteoarthritis, right hip: Secondary | ICD-10-CM | POA: Diagnosis not present

## 2021-08-30 LAB — BASIC METABOLIC PANEL
Anion gap: 8 (ref 5–15)
BUN: 7 mg/dL (ref 6–20)
CO2: 23 mmol/L (ref 22–32)
Calcium: 8.2 mg/dL — ABNORMAL LOW (ref 8.9–10.3)
Chloride: 103 mmol/L (ref 98–111)
Creatinine, Ser: 0.76 mg/dL (ref 0.44–1.00)
GFR, Estimated: >60 mL/min (ref >60)
Glucose, Bld: 153 mg/dL — ABNORMAL HIGH (ref 70–99)
Potassium: 3.9 mmol/L (ref 3.5–5.1)
Sodium: 134 mmol/L — ABNORMAL LOW (ref 135–145)

## 2021-08-30 LAB — CBC
HCT: 35.4 % — ABNORMAL LOW (ref 36.0–46.0)
Hemoglobin: 10.8 g/dL — ABNORMAL LOW (ref 12.0–15.0)
MCH: 22.3 pg — ABNORMAL LOW (ref 26.0–34.0)
MCHC: 30.5 g/dL (ref 30.0–36.0)
MCV: 73.1 fL — ABNORMAL LOW (ref 80.0–100.0)
Platelets: 244 10*3/uL (ref 150–400)
RBC: 4.84 MIL/uL (ref 3.87–5.11)
RDW: 15.8 % — ABNORMAL HIGH (ref 11.5–15.5)
WBC: 12 10*3/uL — ABNORMAL HIGH (ref 4.0–10.5)
nRBC: 0 % (ref 0.0–0.2)

## 2021-08-30 MED ORDER — CYCLOBENZAPRINE HCL 5 MG PO TABS: 5.0000 mg | ORAL_TABLET | Freq: Three times a day (TID) | ORAL | 0 refills | Status: DC | PRN

## 2021-08-30 MED ORDER — KETOROLAC TROMETHAMINE 15 MG/ML IJ SOLN
7.5000 mg | Freq: Once | INTRAMUSCULAR | Status: AC
  Administered 2021-08-30: 7.5 mg via INTRAVENOUS
  Filled 2021-08-30: qty 1

## 2021-08-30 MED ORDER — OXYCODONE HCL 5 MG PO TABS: 5.0000 mg | ORAL_TABLET | Freq: Four times a day (QID) | ORAL | 0 refills | Status: DC | PRN

## 2021-08-30 MED ORDER — DOCUSATE SODIUM 100 MG PO CAPS
100.0000 mg | ORAL_CAPSULE | Freq: Two times a day (BID) | ORAL | 0 refills | Status: DC
Start: 2021-08-30 — End: 2021-12-08

## 2021-08-30 MED ORDER — ASPIRIN 81 MG PO CHEW: 81.0000 mg | CHEWABLE_TABLET | Freq: Two times a day (BID) | ORAL | 0 refills | Status: AC

## 2021-08-30 MED ORDER — CELECOXIB 200 MG PO CAPS: 200.0000 mg | ORAL_CAPSULE | Freq: Two times a day (BID) | ORAL | Status: DC

## 2021-08-30 MED ORDER — CHLORHEXIDINE GLUCONATE CLOTH 2 % EX PADS: 6.0000 | MEDICATED_PAD | Freq: Every day | CUTANEOUS | Status: DC

## 2021-08-30 MED ORDER — ACETAMINOPHEN 325 MG PO TABS: 1000.0000 mg | ORAL_TABLET | Freq: Four times a day (QID) | ORAL | Status: DC | PRN

## 2021-08-30 MED ORDER — SODIUM CHLORIDE 0.9 % IV BOLUS
250.0000 mL | Freq: Once | INTRAVENOUS | Status: AC
Start: 2021-08-30 — End: 2021-08-30
  Administered 2021-08-30: 250 mL via INTRAVENOUS

## 2021-08-30 NOTE — Progress Notes (Signed)
Physical Therapy Treatment Patient Details Name: Christine Bradley MRN: [id number] DOB: 01-10-1966 Today's Date: 08/30/2021   History of Present Illness Christine Bradley is a 55 yo female s/p R THA AA 08/29/21. PMH: CKD, COPD, fibromyalgia    PT Comments    Pt ambulates 200 ft with RW, min guard for safety, continues to be hesitant requiring VC for heel-toe pattern, step through pattern and encouragement to decrease anxiousness. Once returning to room, pt able to toilet with supv for safety, VC for maneuvering RW in small space for safety. Pt able to perform seated BLE strengthening exercises with good motor control, using gait belt for heel slides for increased ROM. At EOS, pt asks if it is normal to feel dizzy after exercise, educated pt on pursed lip breathing and checked vitals. Pt's BP 95/73, HR 90s and SpO2 89-90%- improved to 91% with pursed lip breathing; RN aware and in room to assess pt at EOS. Will continue to progress mobility and exercises acutely with plan to d/c home with family support.   Recommendations for follow up therapy are one component of a multi-disciplinary discharge planning process, led by the attending physician.  Recommendations may be updated based on patient status, additional functional criteria and insurance authorization.  Follow Up Recommendations  Follow physician's recommendations for discharge plan and follow up therapies     Assistance Recommended at Discharge Intermittent Supervision/Assistance  Equipment Recommendations  None recommended by PT (pt reports using family members RW)    Recommendations for Other Services       Precautions / Restrictions Precautions Precautions: Fall Restrictions Weight Bearing Restrictions: No RLE Weight Bearing: Weight bearing as tolerated     Mobility  Bed Mobility  General bed mobility comments: in recliner    Transfers Overall transfer level: Needs assistance Equipment used: Rolling walker (2 wheels) Transfers: Sit  to/from Stand Sit to Stand: Supervision  General transfer comment: increased time, powers to stand with BUE and no physical assist    Ambulation/Gait Ambulation/Gait assistance: Min guard Gait Distance (Feet): 200 Feet Assistive device: Rolling walker (2 wheels) Gait Pattern/deviations: Step-to pattern;Decreased stride length Gait velocity: decreased  General Gait Details: slow, step to pattern, good R foot clearance, increased steps and time with turns, no overt LOB   Stairs             Wheelchair Mobility    Modified Rankin (Stroke Patients Only)       Balance Overall balance assessment: Needs assistance  Sitting balance-Leahy Scale: Good Sitting balance - Comments: seated EOB  Standing balance support: Reliant on assistive device for balance;During functional activity;Bilateral upper extremity supported Standing balance-Leahy Scale: Poor Standing balance comment: static able to release UE, dynamic with RW     Cognition Arousal/Alertness: Awake/alert Behavior During Therapy: WFL for tasks assessed/performed Overall Cognitive Status: Within Functional Limits for tasks assessed     Exercises Total Joint Exercises Ankle Circles/Pumps: Seated;AROM;Both;10 reps Quad Sets: Seated;AROM;Strengthening;Both;10 reps Heel Slides: Seated;AROM;Strengthening;Right;5 reps (gait belt assisting) Hip ABduction/ADduction: Seated;AROM;Strengthening;Right;5 reps Long Arc Quad: Seated;AROM;Strengthening;Both;10 reps    General Comments General comments (skin integrity, edema, etc.): After ambulation, toileting and seated exercises, pt reports dizziness/lightheadedness; BP 95/73, HR 90s and SpO2 89-90% on RA. Pt denies SOB, difficulty explaining how she is feeling, repeats "weird"- RN in room to assess pt.      Pertinent Vitals/Pain Pain Assessment: 0-10 Pain Score: 6  Pain Location: R hip Pain Descriptors / Indicators: Sore;Discomfort Pain Intervention(s): Limited activity  within patient's tolerance;Monitored during session;Repositioned;Ice applied  Home Living                          Prior Function            PT Goals (current goals can now be found in the care plan section) Acute Rehab PT Goals Patient Stated Goal: return home with family support PT Goal Formulation: With patient Time For Goal Achievement: 09/13/21 Potential to Achieve Goals: Good Progress towards PT goals: Progressing toward goals    Frequency    7X/week      PT Plan Current plan remains appropriate    Co-evaluation              AM-PAC PT "6 Clicks" Mobility   Outcome Measure  Help needed turning from your back to your side while in a flat bed without using bedrails?: A Little Help needed moving from lying on your back to sitting on the side of a flat bed without using bedrails?: A Little Help needed moving to and from a bed to a chair (including a wheelchair)?: A Little Help needed standing up from a chair using your arms (e.g., wheelchair or bedside chair)?: A Little Help needed to walk in hospital room?: A Little Help needed climbing 3-5 steps with a railing? : A Little 6 Click Score: 18    End of Session Equipment Utilized During Treatment: Gait belt Activity Tolerance: Patient tolerated treatment well;Other (comment) (BP at EOS) Patient left: in chair;with call bell/phone within reach;with nursing/sitter in room Nurse Communication: Mobility status;Other (comment) (BP, SpO2, dizziness/"weird" complaints) PT Visit Diagnosis: Other abnormalities of gait and mobility (R26.89);Muscle weakness (generalized) (M62.81);Pain Pain - Right/Left: Right Pain - part of body: Hip     Time: 1211-1250 PT Time Calculation (min) (ACUTE ONLY): 39 min  Charges:  $Gait Training: 8-22 mins $Therapeutic Exercise: 8-22 mins $Therapeutic Activity: 8-22 mins                      Christine Bradley PT, DPT 08/30/21, 2:09 PM

## 2021-08-30 NOTE — Progress Notes (Signed)
   Subjective: 1 Day Post-Op Procedure(s) (LRB): TOTAL HIP ARTHROPLASTY ANTERIOR APPROACH (Right) Patient reports pain as moderate.   Patient seen in rounds with Dr. Alvan Dame. Patient is resting in bed on exam this morning. She reports she is doing well. She did mention needing her post op appointment moved up due to traveling for the holidays.  We will start therapy today.   Objective: Vital signs in last 24 hours: Temp:  [96.7 F (35.9 C)-98.4 F (36.9 C)] 98.4 F (36.9 C) (12/07 2426) Pulse Rate:  [66-112] 100 (12/07 0632) Resp:  [12-18] 18 (12/07 8341) BP: (113-151)/(72-101) 113/79 (12/07 0632) SpO2:  [94 %-100 %] 94 % (12/07 9622) Weight:  [88.5 kg] 88.5 kg (12/06 1226)  Intake/Output from previous day:  Intake/Output Summary (Last 24 hours) at 08/30/2021 0753 Last data filed at 08/30/2021 0600 Gross per 24 hour  Intake 3643.54 ml  Output 2450 ml  Net 1193.54 ml     Intake/Output this shift: No intake/output data recorded.  Labs: Recent Labs    08/30/21 0332  HGB 10.8*   Recent Labs    08/30/21 0332  WBC 12.0*  RBC 4.84  HCT 35.4*  PLT 244   Recent Labs    08/30/21 0332  NA 134*  K 3.9  CL 103  CO2 23  BUN 7  CREATININE 0.76  GLUCOSE 153*  CALCIUM 8.2*   No results for input(s): LABPT, INR in the last 72 hours.  Exam: General - Patient is Alert and Oriented Extremity - Neurologically intact Sensation intact distally Intact pulses distally Dorsiflexion/Plantar flexion intact Dressing - dressing C/D/I Motor Function - intact, moving foot and toes well on exam.   Past Medical History:  Diagnosis Date   Anxiety    Arthritis    Carpal tunnel syndrome    Chronic bronchitis (HCC)    Chronic kidney disease    COPD (chronic obstructive pulmonary disease) (HCC)    Fibromyalgia    GERD (gastroesophageal reflux disease)    Headache    Chronic migraines   History of urinary urgency    Neuromuscular disorder (HCC)    fibromyalgia   Pneumonia     hx of x 2   Thyroid disease     Assessment/Plan: 1 Day Post-Op Procedure(s) (LRB): TOTAL HIP ARTHROPLASTY ANTERIOR APPROACH (Right) Principal Problem:   S/P total right hip arthroplasty  Estimated body mass index is 33.47 kg/m as calculated from the following:   Height as of this encounter: 5\' 4"  (1.626 m).   Weight as of this encounter: 88.5 kg. Advance diet Up with therapy D/C IV fluids  DVT Prophylaxis - Aspirin Weight bearing as tolerated.  Plan is to go Home after hospital stay. Plan for discharge home today after 1-2 sessions of therapy as long as she is meeting her goals. Follow up in the office in 2 weeks.   Griffith Citron, PA-C Orthopedic Surgery [phone] 08/30/2021, 7:53 AM

## 2021-08-30 NOTE — Evaluation (Signed)
Physical Therapy Evaluation Patient Details Name: Christine Bradley MRN: [id number] DOB: Nov 16, 1965 Today's Date: 08/30/2021  History of Present Illness  Christine Bradley is a 55 yo female s/p R THA AA 08/29/21. PMH: CKD, COPD, fibromyalgia  Clinical Impression  Pt is s/p R THA AA resulting in the deficits listed below (see PT Problem List). Pt from home with family, single story home with no steps to enter, using SPC to ambulate for comfort, ind with ADLs/IADLs. Pt requires increased time and VC for sequencing to come to sitting EOB and power to stand with RW. Pt ambulates to doorway with steady, step to pattern, but verbalizes anxiousness with ambulation so takes a seated rest break; pt then ambulates 100 ft with RW, no overt LOB, more conversational and less anxious. Pt requests to sit up in chair once back in room, TOC and RN in room. Will check back to progress ambulation and issue HEP. RN notified of 8/10 pain with ambulation. Pt will benefit from skilled PT to increase their independence and safety with mobility to allow discharge to the venue listed below.         Recommendations for follow up therapy are one component of a multi-disciplinary discharge planning process, led by the attending physician.  Recommendations may be updated based on patient status, additional functional criteria and insurance authorization.  Follow Up Recommendations Follow physician's recommendations for discharge plan and follow up therapies    Assistance Recommended at Discharge Intermittent Supervision/Assistance  Functional Status Assessment Patient has had a recent decline in their functional status and demonstrates the ability to make significant improvements in function in a reasonable and predictable amount of time.  Equipment Recommendations  None recommended by PT (pt reports using family members RW)    Recommendations for Other Services       Precautions / Restrictions Precautions Precautions:  Fall Restrictions Weight Bearing Restrictions: No RLE Weight Bearing: Weight bearing as tolerated      Mobility  Bed Mobility Overal bed mobility: Needs Assistance Bed Mobility: Supine to Sit  Supine to sit: Min guard  General bed mobility comments: increased time, self assisting RLE with UEs for comfort    Transfers Overall transfer level: Needs assistance Equipment used: Rolling walker (2 wheels) Transfers: Sit to/from Stand;Bed to chair/wheelchair/BSC Sit to Stand: Min guard Stand pivot transfers: Min guard  General transfer comment: powers to stand with BUE assisting, RLE slightly extended    Ambulation/Gait Ambulation/Gait assistance: Min guard Gait Distance (Feet): 100 Feet Assistive device: Rolling walker (2 wheels) Gait Pattern/deviations: Step-to pattern;Decreased stride length Gait velocity: decreased  General Gait Details: slow, step to pattern, initially hesitant but improves with distance, good R foot clearance, increased time with turns, no RLE buckling noted and no overt LOB  Stairs            Wheelchair Mobility    Modified Rankin (Stroke Patients Only)       Balance Overall balance assessment: Needs assistance  Sitting balance-Leahy Scale: Good Sitting balance - Comments: seated EOB  Standing balance support: Reliant on assistive device for balance;During functional activity;Bilateral upper extremity supported Standing balance-Leahy Scale: Poor Standing balance comment: reliant on RW       Pertinent Vitals/Pain Pain Assessment: 0-10 Pain Score: 8  Pain Location: R hip Pain Descriptors / Indicators: Sore;Discomfort Pain Intervention(s): Limited activity within patient's tolerance;Monitored during session;Patient requesting pain meds-RN notified;Ice applied;Repositioned    Home Living Family/patient expects to be discharged to:: Private residence Living Arrangements: Other relatives Available Help  at Discharge: Family;Available 24  hours/day Type of Home: House Home Access: Level entry  Home Layout: One level Home Equipment: Cane - single Barista (2 wheels) Additional Comments: pt reports has deceased family members RW    Prior Function Prior Level of Function : Independent/Modified Independent  Mobility Comments: pt reports ind with community ambulation using SPC ADLs Comments: pt reports ind with ADLs/IADLs     Hand Dominance        Extremity/Trunk Assessment   Upper Extremity Assessment Upper Extremity Assessment: Overall WFL for tasks assessed    Lower Extremity Assessment Lower Extremity Assessment: RLE deficits/detail;LLE deficits/detail RLE Deficits / Details: ankle AROM WNL, able to perform quad set, hip abd/add 2-/5, knee extension 3/5, hip flex 2-/5 RLE Coordination: WNL LLE Deficits / Details: strength 4/5, AROM WNL throughout LLE Coordination: WNL    Cervical / Trunk Assessment Cervical / Trunk Assessment: Normal  Communication   Communication: No difficulties  Cognition Arousal/Alertness: Awake/alert Behavior During Therapy: WFL for tasks assessed/performed Overall Cognitive Status: Within Functional Limits for tasks assessed  General Comments: pt very pleasant and talkative        General Comments      Exercises     Assessment/Plan    PT Assessment Patient needs continued PT services  PT Problem List Decreased strength;Decreased range of motion;Decreased activity tolerance;Decreased balance;Decreased mobility;Pain       PT Treatment Interventions DME instruction;Gait training;Functional mobility training;Therapeutic activities;Therapeutic exercise;Balance training;Neuromuscular re-education;Patient/family education;Modalities    PT Goals (Current goals can be found in the Care Plan section)  Acute Rehab PT Goals Patient Stated Goal: return home with family support PT Goal Formulation: With patient Time For Goal Achievement: 09/13/21 Potential to Achieve  Goals: Good    Frequency 7X/week   Barriers to discharge        Co-evaluation               AM-PAC PT "6 Clicks" Mobility  Outcome Measure Help needed turning from your back to your side while in a flat bed without using bedrails?: A Little Help needed moving from lying on your back to sitting on the side of a flat bed without using bedrails?: A Little Help needed moving to and from a bed to a chair (including a wheelchair)?: A Little Help needed standing up from a chair using your arms (e.g., wheelchair or bedside chair)?: A Little Help needed to walk in hospital room?: A Little Help needed climbing 3-5 steps with a railing? : A Little 6 Click Score: 18    End of Session Equipment Utilized During Treatment: Gait belt Activity Tolerance: Patient tolerated treatment well;Patient limited by pain Patient left: in chair;with call bell/phone within reach;with nursing/sitter in room;Other (comment) (TOC in room) Nurse Communication: Mobility status PT Visit Diagnosis: Other abnormalities of gait and mobility (R26.89);Muscle weakness (generalized) (M62.81);Pain Pain - Right/Left: Right Pain - part of body: Hip    Time: 6578-4696 PT Time Calculation (min) (ACUTE ONLY): 41 min   Charges:   PT Evaluation $PT Eval Low Complexity: 1 Low PT Treatments $Gait Training: 8-22 mins         Tori Braelon Sprung PT, DPT 08/30/21, 9:54 AM

## 2021-08-30 NOTE — TOC Transition Note (Signed)
Transition of Care [facility]) - CM/SW Discharge Note  Patient Details  Name: Christine Bradley MRN: [id number] Date of Birth: 1966-04-06  [facility] CM/SW Contact:  Sherie Don, LCSW Phone Number: 08/30/2021, 10:10 AM  Clinical Narrative: Patient is expected to discharge home after working with PT. CSW met with patient to review discharge plan. Patient will discharge home with a home exercise program (HEP). Patient has a rolling walker and elevated toilets at home, so there are no DME needs at this time. TOC signing off.  Final next level of care: Home/Self Care Barriers to Discharge: No Barriers Identified  Patient Goals and CMS Choice Patient states their goals for this hospitalization and ongoing recovery are:: Discharge home with HEP Choice offered to / list presented to : NA  Discharge Plan and Services        DME Arranged: N/A DME Agency: NA  Readmission Risk Interventions No flowsheet data found.

## 2021-08-31 ENCOUNTER — Encounter (HOSPITAL_COMMUNITY): Payer: Self-pay | Admitting: Orthopedic Surgery

## 2021-08-31 DIAGNOSIS — M1611 Unilateral primary osteoarthritis, right hip: Secondary | ICD-10-CM | POA: Diagnosis not present

## 2021-08-31 LAB — CBC
HCT: 30.6 % — ABNORMAL LOW (ref 36.0–46.0)
Hemoglobin: 9.4 g/dL — ABNORMAL LOW (ref 12.0–15.0)
MCH: 22.4 pg — ABNORMAL LOW (ref 26.0–34.0)
MCHC: 30.7 g/dL (ref 30.0–36.0)
MCV: 72.9 fL — ABNORMAL LOW (ref 80.0–100.0)
Platelets: 199 10*3/uL (ref 150–400)
RBC: 4.2 MIL/uL (ref 3.87–5.11)
RDW: 15.9 % — ABNORMAL HIGH (ref 11.5–15.5)
WBC: 16.9 10*3/uL — ABNORMAL HIGH (ref 4.0–10.5)
nRBC: 0 % (ref 0.0–0.2)

## 2021-08-31 NOTE — Progress Notes (Signed)
Physical Therapy Treatment Patient Details Name: Christine Bradley MRN: [id number] DOB: Mar 07, 1966 Today's Date: 08/31/2021   History of Present Illness Christine Bradley is a 55 yo female s/p R THA AA 08/29/21. PMH: CKD, COPD, fibromyalgia    PT Comments    POD # 2 am session Pt already OOB in recliner.  Tolerated amb a functional distance at supervision level.  Good safety cognition.   Then returned to room to perform some TE's following HEP handout.  Instructed on proper tech, freq as well as use of ICE.   Pt plans to return home alone but will have family support "in and out".  Addressed all mobility questions, discussed appropriate activity, educated on use of ICE.  Pt ready for D/C to home.   Recommendations for follow up therapy are one component of a multi-disciplinary discharge planning process, led by the attending physician.  Recommendations may be updated based on patient status, additional functional criteria and insurance authorization.  Follow Up Recommendations  Follow physician's recommendations for discharge plan and follow up therapies     Assistance Recommended at Discharge Intermittent Supervision/Assistance  Equipment Recommendations  None recommended by PT    Recommendations for Other Services       Precautions / Restrictions Precautions Precautions: Fall Restrictions Weight Bearing Restrictions: No RLE Weight Bearing: Weight bearing as tolerated     Mobility  Bed Mobility               General bed mobility comments: OOB in recliner    Transfers Overall transfer level: Needs assistance Equipment used: Rolling walker (2 wheels) Transfers: Sit to/from Stand Sit to Stand: Supervision Stand pivot transfers: Supervision         General transfer comment: increased time, powers to stand with BUE and no physical assist.  Good safety awareness and use of hands to steady self.    Ambulation/Gait Ambulation/Gait assistance: Supervision Gait Distance (Feet):  125 Feet Assistive device: Rolling walker (2 wheels) Gait Pattern/deviations: Step-to pattern;Decreased stride length Gait velocity: decreased     General Gait Details: slow but staedy, tolerated a functional distance with walker.  Good safety cognition.   Stairs Stairs:  (no stairs to enter home)           Wheelchair Mobility    Modified Rankin (Stroke Patients Only)       Balance                                            Cognition Arousal/Alertness: Awake/alert Behavior During Therapy: WFL for tasks assessed/performed Overall Cognitive Status: Within Functional Limits for tasks assessed                                 General Comments: pt very pleasant and talkative        Exercises  Total Hip Replacement TE's following HEP Handout 10 reps ankle pumps 05 reps knee presses 05 reps heel slides 05 reps SAQ's 05 reps ABD 05 reps all standing TE's   Instructed how to use a belt loop to assist  Followed by ICE     General Comments        Pertinent Vitals/Pain Pain Assessment: 0-10 Pain Score: 6  Pain Location: R hip Pain Descriptors / Indicators: Sore;Discomfort;Operative site guarding Pain Intervention(s): Monitored during session;Premedicated before session;Repositioned;Ice applied  Home Living                          Prior Function            PT Goals (current goals can now be found in the care plan section) Progress towards PT goals: Progressing toward goals    Frequency    7X/week      PT Plan Current plan remains appropriate    Co-evaluation              AM-PAC PT "6 Clicks" Mobility   Outcome Measure  Help needed turning from your back to your side while in a flat bed without using bedrails?: None Help needed moving from lying on your back to sitting on the side of a flat bed without using bedrails?: None Help needed moving to and from a bed to a chair (including a  wheelchair)?: None Help needed standing up from a chair using your arms (e.g., wheelchair or bedside chair)?: None Help needed to walk in hospital room?: None   6 Click Score: 20    End of Session Equipment Utilized During Treatment: Gait belt Activity Tolerance: Patient tolerated treatment well;Other (comment) Patient left: in chair;with call bell/phone within reach;with nursing/sitter in room Nurse Communication: Mobility status;Other (comment) PT Visit Diagnosis: Other abnormalities of gait and mobility (R26.89);Muscle weakness (generalized) (M62.81);Pain Pain - Right/Left: Right Pain - part of body: Hip     Time: 3785-8850 PT Time Calculation (min) (ACUTE ONLY): 30 min  Charges:  $Gait Training: 8-22 mins $Therapeutic Exercise: 8-22 mins                     {Rissie Sculley  PTA Acute  Rehabilitation Services Pager      [phone] Office      [phone]

## 2021-08-31 NOTE — Progress Notes (Signed)
Provided discharge education/instructions, all questions and concerns addressed, Pt not in acute distress. Pt to discharge home with belongings accompanied by brother.

## 2021-08-31 NOTE — Progress Notes (Signed)
Patient ID: Christine Bradley, female   DOB: 11-22-1965, 55 y.o.   MRN: [id number] Subjective: 2 Days Post-Op Procedure(s) (LRB): TOTAL HIP ARTHROPLASTY ANTERIOR APPROACH (Right)    Patient reports pain as mild to moderate. Post op pain aggravated by underlying medical co-morbidity of her fibromyalgia  Objective:   VITALS:   Vitals:   08/30/21 2109 08/31/21 0618  BP: 136/67 115/61  Pulse: 98 99  Resp: 16 17  Temp: 98.1 F (36.7 C) 98 F (36.7 C)  SpO2: 100% 95%    Neurovascular intact Incision: dressing C/D/I  LABS Recent Labs    08/30/21 0332 08/31/21 0346  HGB 10.8* 9.4*  HCT 35.4* 30.6*  WBC 12.0* 16.9*  PLT 244 199    Recent Labs    08/30/21 0332  NA 134*  K 3.9  BUN 7  CREATININE 0.76  GLUCOSE 153*    No results for input(s): LABPT, INR in the last 72 hours.   Assessment/Plan: 2 Days Post-Op Procedure(s) (LRB): TOTAL HIP ARTHROPLASTY ANTERIOR APPROACH (Right)   Up with therapy Able to go home today after therapy RTC in 2 weeks
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
## 2021-10-24 NOTE — Patient Instructions (Signed)
DUE TO COVID-19 ONLY ONE VISITOR IS ALLOWED TO COME WITH YOU AND STAY IN THE WAITING ROOM ONLY DURING PRE OP AND PROCEDURE DAY OF SURGERY IF YOU ARE GOING HOME AFTER SURGERY. IF YOU ARE SPENDING THE NIGHT 2 PEOPLE MAY VISIT WITH YOU IN YOUR PRIVATE ROOM AFTER SURGERY UNTIL VISITING  HOURS ARE OVER AT 800 PM AND 1  VISITOR  MAY  SPEND THE NIGHT.   YOU NEED TO HAVE A COVID 19 TEST ON_2/10/23______ @_8 :45______, THIS TEST MUST BE DONE BEFORE SURGERY,  COVID TESTING SITE  IS LOCATED AT [place], [place]. REMAIN IN YOUR CAR THIS IS A DRIVE UP TEST. AFTER YOUR COVID TEST PLEASE WEAR A MASK OUT IN PUBLIC AND SOCIAL DISTANCE AND [place] YOUR HANDS FREQUENTLY, ALSO ASK ALL YOUR CLOSE CONTACT PERSONS TO WEAR A MASK AND SOCIAL DISTANCE AND [place] THEIR HANDS FREQUENTLY ALSO.               Christine Bradley     Your procedure is scheduled on: 11/07/21   Report to [facility] Main  Entrance   Report to admitting at 7:17 AM     Call this number if you have problems the morning of surgery [phone]    No food after midnight.    You may have clear liquid until 7:00 AM.    At 6:45  AM drink pre surgery drink.   G2  Nothing by mouth after 7:00 AM.   CLEAR LIQUID DIET   Foods Allowed                                                                     Foods Excluded  Coffee and tea, regular and decaf                             liquids that you cannot  Plain Jell-O any favor except red or purple                                           see through such as: Fruit ices (not with fruit pulp)                                     milk, soups, orange juice  Iced Popsicles                                    All solid food Carbonated beverages, regular and diet                                    Cranberry, grape and apple juices Sports drinks like Gatorade Lightly seasoned clear broth or consume(fat free) Sugar    BRUSH YOUR TEETH MORNING OF SURGERY AND RINSE YOUR MOUTH OUT, NO CHEWING  GUM CANDY OR MINTS.     Take these medicines the morning  of surgery with A SIP OF WATER: Venlafaxine, Pantoprazole, Oxybutynin                                You may not have any metal on your body including hair pins and              piercings  Do not wear jewelry, make-up, lotions, powders or perfumes, deodorant             Do not wear nail polish on your fingernails.  Do not shave  48 hours prior to surgery.                 Do not bring valuables to the hospital. [place].  Contacts, dentures or bridgework may not be worn into surgery.  Leave suitcase in the car. After surgery it may be brought to your room.                  Please read over the following fact sheets you were given: _____________________________________________________________________             [facility] - Preparing for Surgery Before surgery, you can play an important role.  Because skin is not sterile, your skin needs to be as free of germs as possible.  You can reduce the number of germs on your skin by washing with CHG (chlorahexidine gluconate) soap before surgery.  CHG is an antiseptic cleaner which kills germs and bonds with the skin to continue killing germs even after washing. Please DO NOT use if you have an allergy to CHG or antibacterial soaps.  If your skin becomes reddened/irritated stop using the CHG and inform your nurse when you arrive at Short Stay. Do not shave (including legs and underarms) for at least 48 hours prior to the first CHG shower.   Please follow these instructions carefully:  1.  Shower with CHG Soap the night before surgery and the  morning of Surgery.  2.  If you choose to wash your hair, wash your hair first as usual with your  normal  shampoo.  3.  After you shampoo, rinse your hair and body thoroughly to remove the  shampoo.                            4.  Use CHG as you would any other liquid soap.  You can apply chg directly   to the skin and wash                       Gently with a scrungie or clean washcloth.  5.  Apply the CHG Soap to your body ONLY FROM THE NECK DOWN.   Do not use on face/ open                           Wound or open sores. Avoid contact with eyes, ears mouth and genitals (private parts).                       Wash face,  Genitals (private parts) with your normal soap.             6.  Wash thoroughly, paying special attention  to the area where your surgery  will be performed.  7.  Thoroughly rinse your body with warm water from the neck down.  8.  DO NOT shower/wash with your normal soap after using and rinsing off  the CHG Soap.                9.  Pat yourself dry with a clean towel.            10.  Wear clean pajamas.            11.  Place clean sheets on your bed the night of your first shower and do not  sleep with pets. Day of Surgery : Do not apply any lotions/deodorants the morning of surgery.  Please wear clean clothes to the hospital/surgery center.  FAILURE TO FOLLOW THESE INSTRUCTIONS MAY RESULT IN THE CANCELLATION OF YOUR SURGERY PATIENT SIGNATURE_________________________________  NURSE SIGNATURE__________________________________  ________________________________________________________________________   Christine Bradley  An incentive spirometer is a tool that can help keep your lungs clear and active. This tool measures how well you are filling your lungs with each breath. Taking long deep breaths may help reverse or decrease the chance of developing breathing (pulmonary) problems (especially infection) following: A long period of time when you are unable to move or be active. BEFORE THE PROCEDURE  If the spirometer includes an indicator to show your best effort, your nurse or respiratory therapist will set it to a desired goal. If possible, sit up straight or lean slightly forward. Try not to slouch. Hold the incentive spirometer in an upright position. INSTRUCTIONS FOR  USE  Sit on the edge of your bed if possible, or sit up as far as you can in bed or on a chair. Hold the incentive spirometer in an upright position. Breathe out normally. Place the mouthpiece in your mouth and seal your lips tightly around it. Breathe in slowly and as deeply as possible, raising the piston or the ball toward the top of the column. Hold your breath for 3-5 seconds or for as long as possible. Allow the piston or ball to fall to the bottom of the column. Remove the mouthpiece from your mouth and breathe out normally. Rest for a few seconds and repeat Steps 1 through 7 at least 10 times every 1-2 hours when you are awake. Take your time and take a few normal breaths between deep breaths. The spirometer may include an indicator to show your best effort. Use the indicator as a goal to work toward during each repetition. After each set of 10 deep breaths, practice coughing to be sure your lungs are clear. If you have an incision (the cut made at the time of surgery), support your incision when coughing by placing a pillow or rolled up towels firmly against it. Once you are able to get out of bed, walk around indoors and cough well. You may stop using the incentive spirometer when instructed by your caregiver.  RISKS AND COMPLICATIONS Take your time so you do not get dizzy or light-headed. If you are in pain, you may need to take or ask for pain medication before doing incentive spirometry. It is harder to take a deep breath if you are having pain. AFTER USE Rest and breathe slowly and easily. It can be helpful to keep track of a log of your progress. Your caregiver can provide you with a simple table to help with this. If you are using the spirometer at home, follow these instructions:  SEEK MEDICAL CARE IF:  You are having difficultly using the spirometer. You have trouble using the spirometer as often as instructed. Your pain medication is not giving enough relief while using the  spirometer. You develop fever of 100.5 F (38.1 C) or higher. SEEK IMMEDIATE MEDICAL CARE IF:  You cough up bloody sputum that had not been present before. You develop fever of 102 F (38.9 C) or greater. You develop worsening pain at or near the incision site. MAKE SURE YOU:  Understand these instructions. Will watch your condition. Will get help right away if you are not doing well or get worse. Document Released: 01/21/2007 Document Revised: 12/03/2011 Document Reviewed: 03/24/2007 [facility] Patient Information 2014 [place], [place].   ________________________________________________________________________

## 2021-10-25 ENCOUNTER — Encounter (HOSPITAL_COMMUNITY)
Admission: RE | Admit: 2021-10-25 | Discharge: 2021-10-25 | Disposition: A | Discharge status: Home / Self Care | Payer: Medicare Other | Source: Ambulatory Visit | Attending: Orthopedic Surgery | Admitting: Orthopedic Surgery

## 2021-10-25 ENCOUNTER — Other Ambulatory Visit: Payer: Self-pay

## 2021-10-25 ENCOUNTER — Encounter (HOSPITAL_COMMUNITY): Payer: Self-pay

## 2021-10-25 VITALS — BP 131/93 | HR 98 | Temp 98.9°F | Resp 18 | Ht 66.0 in | Wt 196.5 lb

## 2021-10-25 DIAGNOSIS — Z131 Encounter for screening for diabetes mellitus: Secondary | ICD-10-CM | POA: Insufficient documentation

## 2021-10-25 DIAGNOSIS — Z79899 Other long term (current) drug therapy: Secondary | ICD-10-CM

## 2021-10-25 DIAGNOSIS — Z01812 Encounter for preprocedural laboratory examination: Secondary | ICD-10-CM | POA: Insufficient documentation

## 2021-10-25 DIAGNOSIS — Z01818 Encounter for other preprocedural examination: Secondary | ICD-10-CM

## 2021-10-25 HISTORY — DX: Arthropathic psoriasis, unspecified: L40.50

## 2021-10-25 HISTORY — DX: Interstitial cystitis (chronic) without hematuria: N30.10

## 2021-10-25 HISTORY — DX: Irritable bowel syndrome, unspecified: K58.9

## 2021-10-25 LAB — TYPE AND SCREEN
ABO/RH(D): A POS
Antibody Screen: NEGATIVE

## 2021-10-25 LAB — GLUCOSE, CAPILLARY: Glucose-Capillary: 120 mg/dL — ABNORMAL HIGH (ref 70–99)

## 2021-10-25 LAB — SURGICAL PCR SCREEN
MRSA, PCR: NEGATIVE
Staphylococcus aureus: NEGATIVE

## 2021-10-25 NOTE — Progress Notes (Signed)
COVID test- 11/03/21 at 8:45   PCP - Dr. Henrene Hawking Cardiologist - none  Chest x-ray - 02/27/21-epic EKG - 02/28/21-epic Stress Test - no ECHO - no Cardiac Cath - no Pacemaker/ICD device last checked:NA  Sleep Study - yes- negative CPAP - no  Fasting Blood Sugar -Pre diabetic. Pt doesn't test Checks Blood Sugar _____ times a day  Blood Thinner Instructions:NA Aspirin Instructions: Last Dose:  Anesthesia review: yes  Patient denies shortness of breath, fever, cough and chest pain at PAT appointment Pt has COPD and does get SOB climbing 1 flight of stairs and walking but not with ADLs  Patient verbalized understanding of instructions that were given to them at the PAT appointment. Patient was also instructed that they will need to review over the PAT instructions again at home before surgery. Yes Labs are in epic

## 2021-10-26 ENCOUNTER — Encounter (HOSPITAL_COMMUNITY): Payer: Medicare Other

## 2021-11-03 ENCOUNTER — Encounter (HOSPITAL_COMMUNITY)
Admission: RE | Admit: 2021-11-03 | Discharge: 2021-11-03 | Disposition: A | Discharge status: Home / Self Care | Payer: Medicare Other | Source: Ambulatory Visit | Attending: Orthopedic Surgery | Admitting: Orthopedic Surgery

## 2021-11-03 ENCOUNTER — Other Ambulatory Visit: Payer: Self-pay

## 2021-11-03 DIAGNOSIS — Z01812 Encounter for preprocedural laboratory examination: Secondary | ICD-10-CM | POA: Diagnosis present

## 2021-11-03 DIAGNOSIS — Z20822 Contact with and (suspected) exposure to covid-19: Secondary | ICD-10-CM | POA: Insufficient documentation

## 2021-11-03 DIAGNOSIS — Z96641 Presence of right artificial hip joint: Secondary | ICD-10-CM | POA: Diagnosis not present

## 2021-11-03 LAB — SARS CORONAVIRUS 2 (TAT 6-24 HRS): SARS Coronavirus 2: NEGATIVE

## 2021-11-06 NOTE — H&P (Signed)
TOTAL KNEE ADMISSION H&P  Patient is being admitted for right total knee arthroplasty.  Subjective:  Chief Complaint:right knee pain.  HPI: Christine Bradley, 56 y.o. female, has a history of pain and functional disability in the right knee due to arthritis and has failed non-surgical conservative treatments for greater than 12 weeks to includeNSAID's and/or analgesics, corticosteriod injections, and activity modification.  Onset of symptoms was gradual, starting 2 years ago with gradually worsening course since that time. The patient noted no past surgery on the right knee(s).  Patient currently rates pain in the right knee(s) at 7 out of 10 with activity. Patient has worsening of pain with activity and weight bearing, pain that interferes with activities of daily living, and pain with passive range of motion.  Patient has evidence of joint space narrowing by imaging studies. There is no active infection.  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   Past Medical History:  Diagnosis Date   Anxiety    Arthritis    Carpal tunnel syndrome    Chronic bronchitis (HCC)    Chronic kidney disease    COPD (chronic obstructive pulmonary disease) (HCC)    Fibromyalgia    GERD (gastroesophageal reflux disease)    Headache    Chronic migraines   IBS (irritable bowel syndrome)    IC (interstitial cystitis)    Neuromuscular disorder (HCC)    fibromyalgia   Pneumonia 2001   hx of x 2   Psoriatic arthritis ([place])     Past Surgical History:  Procedure Laterality Date   ABDOMINAL HYSTERECTOMY  2004   BREAST EXCISIONAL BIOPSY Left 12/31/2017   foot surgery  1997   RADIOACTIVE SEED GUIDED EXCISIONAL BREAST BIOPSY Left 12/31/2017   Procedure: LEFT BREAST SEED GUIDED EXCISIONAL BIOPSY;  Surgeon: Coralie Keens, MD;  Location: [place];  Service: General;  Laterality: Left;   TOTAL HIP ARTHROPLASTY Right 08/29/2021   Procedure: TOTAL HIP ARTHROPLASTY  ANTERIOR APPROACH;  Surgeon: Paralee Cancel, MD;  Location: [facility];  Service: Orthopedics;  Laterality: Right;    No current facility-administered medications for this encounter.   Current Outpatient Medications  Medication Sig Dispense Refill Last Dose   atorvastatin (LIPITOR) 10 MG tablet Take 10 mg by mouth in the morning.      cetirizine (ZYRTEC) 10 MG tablet Take 10 mg by mouth in the morning.      Cholecalciferol (VITAMIN D) 2000 units tablet Take 4,000 Units by mouth daily.      cyclobenzaprine (FLEXERIL) 10 MG tablet Take 20 mg by mouth at bedtime.      diclofenac Sodium (VOLTAREN) 1 % GEL Apply 2 g topically 4 (four) times daily. 100 g 0    famotidine (PEPCID) 40 MG tablet Take 40 mg by mouth at bedtime.      ferrous sulfate 325 (65 FE) MG tablet Take 325 mg by mouth in the morning.      fluticasone (FLONASE) 50 MCG/ACT nasal spray Place 2 sprays into both nostrils daily.      Fluticasone-Salmeterol (ADVAIR) 250-50 MCG/DOSE AEPB Inhale 1 puff into the lungs 2 (two) times daily.      folic acid (FOLVITE) 1 MG tablet Take 1 mg by mouth in the morning.      ipratropium (ATROVENT HFA) 17 MCG/ACT inhaler Inhale 2 puffs into the lungs every 6 (six) hours as needed for wheezing.       linaclotide (LINZESS) 72 MCG capsule Take  72 mcg by mouth daily before breakfast.      methotrexate (RHEUMATREX) 2.5 MG tablet Take 20 mg by mouth every Friday. Caution:Chemotherapy. Protect from light.      montelukast (SINGULAIR) 10 MG tablet Take 10 mg by mouth in the morning.      Multiple Vitamins-Minerals (HAIR/SKIN/NAILS PO) Take 2 tablets by mouth every morning.       Multiple Vitamins-Minerals (MULTIVITAMIN GUMMIES ADULT PO) Take 2 tablets by mouth every morning.      nortriptyline (PAMELOR) 10 MG capsule Take 20 mg by mouth at bedtime.      oxybutynin (DITROPAN-XL) 5 MG 24 hr tablet Take 5 mg by mouth at bedtime.      pantoprazole (PROTONIX) 40 MG tablet Take 40 mg by mouth daily before breakfast.        Polyethyl Glycol-Propyl Glycol 0.4-0.3 % SOLN Place 1 drop into both eyes in the morning.      pregabalin (LYRICA) 150 MG capsule Take 150 mg by mouth at bedtime.       promethazine-dextromethorphan (PROMETHAZINE-DM) 6.25-15 MG/5ML syrup Take 2.5 mLs by mouth 4 (four) times daily as needed for cough. (Patient taking differently: Take 5 mLs by mouth at bedtime as needed for cough.) 60 mL 0    Secukinumab (COSENTYX) 150 MG/ML SOSY Inject 150 mg into the skin every 30 (thirty) days.      topiramate (TOPAMAX) 100 MG tablet Take 200 mg by mouth at bedtime.       venlafaxine XR (EFFEXOR-XR) 75 MG 24 hr capsule Take 75 mg by mouth daily.      vitamin C (ASCORBIC ACID) 250 MG tablet Take 500 mg by mouth daily.      acetaminophen (TYLENOL) 325 MG tablet Take 3 tablets (975 mg total) by mouth every 6 (six) hours as needed. (Patient not taking: Reported on 10/19/2021)   Not Taking   albuterol (PROVENTIL) (2.5 MG/3ML) 0.083% nebulizer solution Inhale 3 mLs into the lungs every 6 (six) hours as needed for shortness of breath or wheezing.      COVID-19 mRNA bivalent vaccine, Pfizer, (PFIZER COVID-19 VAC BIVALENT) injection Inject into the muscle. (Patient not taking: Reported on 10/19/2021) 0.3 mL 0 Not Taking   COVID-19 mRNA Vac-TriS, Pfizer, (PFIZER-BIONT COVID-19 VAC-TRIS) SUSP injection Inject into the muscle. (Patient not taking: Reported on 10/19/2021) 0.3 mL 0 Not Taking   cyclobenzaprine (FLEXERIL) 5 MG tablet Take 1 tablet (5 mg total) by mouth 3 (three) times daily as needed for muscle spasms. Take 1 tablet by mouth morning and afternoon, and continue your normal dose of 20 mg nightly as needed (Patient not taking: Reported on 10/19/2021) 30 tablet 0 Not Taking   docusate sodium (COLACE) 100 MG capsule Take 1 capsule (100 mg total) by mouth 2 (two) times daily. (Patient not taking: Reported on 10/19/2021) 10 capsule 0 Not Taking   oxyCODONE (OXY IR/ROXICODONE) 5 MG immediate release tablet Take 1-2 tablets  (5-10 mg total) by mouth every 6 (six) hours as needed for severe pain. (Patient not taking: Reported on 10/19/2021) 42 tablet 0 Not Taking   Allergies  Allergen Reactions   Albuterol Other (See Comments)    Inhaler only (nebulizer is fine) - reaction: worsened asthma and bronchitis   Hydrocodone-Acetaminophen Nausea And Vomiting   Maxalt [Rizatriptan]     Worsened migraines    Penicillins Other (See Comments)    "raw on my bottom" Has patient had a PCN reaction causing immediate rash, facial/tongue/throat swelling, SOB or lightheadedness with  hypotension: No Has patient had a PCN reaction causing severe rash involving mucus membranes or skin necrosis: No Has patient had a PCN reaction that required hospitalization No Has patient had a PCN reaction occurring within the last 10 years: No If all of the above answers are "NO", then may proceed with Cephalosporin use. Tolerated ancef 08/29/21     Voltaren [Diclofenac Sodium] Other (See Comments)    Stomach ulcers - in pill form, pt is currently using the topical form    Social History   Tobacco Use   Smoking status: Never   Smokeless tobacco: Never  Substance Use Topics   Alcohol use: Never    Family History  Problem Relation Age of Onset   Arthritis Mother    Breast cancer Mother 70     Review of Systems  Constitutional:  Negative for chills and fever.  Respiratory:  Negative for cough and shortness of breath.   Cardiovascular:  Negative for chest pain.  Gastrointestinal:  Negative for nausea and vomiting.  Musculoskeletal:  Positive for arthralgias.    Objective:  Physical Exam Well nourished and well developed. General: Alert and oriented x3, cooperative and pleasant, no acute distress. Head: normocephalic, atraumatic, neck supple. Eyes: EOMI.  Musculoskeletal: Bilateral knee exams: No palpable effusions, warmth erythema Tenderness with crepitation over the anterior aspect the knees Flexion to 120 degrees with  tightness and crepitation Full knee extension without palpable defect  Calves soft and nontender. Motor function intact in LE. Strength 5/5 LE bilaterally. Neuro: Distal pulses 2+. Sensation to light touch intact in LE.   Vital signs in last 24 hours:    Labs:   Estimated body mass index is 31.72 kg/m as calculated from the following:   Height as of 10/25/21: 5\' 6"  (1.676 m).   Weight as of 10/25/21: 89.1 kg.   Imaging Review Plain radiographs demonstrate severe degenerative joint disease of the right knee(s). The overall alignment isneutral. The bone quality appears to be adequate for age and reported activity level.      Assessment/Plan:  End stage arthritis, right knee   The patient history, physical examination, clinical judgment of the provider and imaging studies are consistent with end stage degenerative joint disease of the right knee(s) and total knee arthroplasty is deemed medically necessary. The treatment options including medical management, injection therapy arthroscopy and arthroplasty were discussed at length. The risks and benefits of total knee arthroplasty were presented and reviewed. The risks due to aseptic loosening, infection, stiffness, patella tracking problems, thromboembolic complications and other imponderables were discussed. The patient acknowledged the explanation, agreed to proceed with the plan and consent was signed. Patient is being admitted for inpatient treatment for surgery, pain control, PT, OT, prophylactic antibiotics, VTE prophylaxis, progressive ambulation and ADL's and discharge planning. The patient is planning to be discharged  home.  Risks and benefits of the surgery were discussed with the patient and Dr. Alvan Dame at their previous office visit, and the patient has elected to move forward with the aforementioned surgery. Post-operative care plans were discussed with the patient today and all patient questions were answered. Therapy Plans:  outpatient therapy at [facility] Disposition: Home with brother Planned DVT Prophylaxis: aspirin 81mg  BID DME needed: none PCP: Suzanna Obey, clearance received Rheumatologist: Dr. Dossie Der, clearance received TXA: IV Allergies: pump albuterol - issues with breathing, PCN - rash, Norco - vomiting Anesthesia Concerns: none BMI: Last HgbA1c: Not diabetic  Other: - oxycodone, celebrex - Hx of fibromyalgia - on lyrica &  flexeril ( may send 5 mg tablets if needed or use Robaxin - Hx of plaque psoriasis (Cosyntx & methotrexate) - stopping MTX 1 week prior - Hx of Asthma/COPD   Patient's anticipated LOS is less than 2 midnights, meeting these requirements: - Younger than 68 - Lives within 1 hour of care - Has a competent adult at home to recover with post-op recover - NO history of  - Chronic pain requiring opiods  - Diabetes  - Coronary Artery Disease  - Heart failure  - Heart attack  - Stroke  - DVT/VTE  - Cardiac arrhythmia  - Respiratory Failure/COPD  - Renal failure  - Anemia  - Advanced Liver disease  Costella Hatcher, PA-C Orthopedic Surgery [facility] [place] [phone]

## 2021-11-07 ENCOUNTER — Encounter (HOSPITAL_COMMUNITY)
Admission: RE | Disposition: A | Discharge status: Home / Self Care | Payer: Self-pay | Source: Ambulatory Visit | Attending: Orthopedic Surgery

## 2021-11-07 ENCOUNTER — Other Ambulatory Visit: Payer: Self-pay

## 2021-11-07 ENCOUNTER — Observation Stay (HOSPITAL_COMMUNITY)
Admission: RE | Admit: 2021-11-07 | Discharge: 2021-11-08 | Disposition: A | Discharge status: Home / Self Care | Payer: Medicare Other | Source: Ambulatory Visit | Attending: Orthopedic Surgery | Admitting: Orthopedic Surgery

## 2021-11-07 ENCOUNTER — Encounter (HOSPITAL_COMMUNITY): Payer: Self-pay | Admitting: Orthopedic Surgery

## 2021-11-07 ENCOUNTER — Ambulatory Visit (HOSPITAL_COMMUNITY): Payer: Medicare Other | Admitting: Emergency Medicine

## 2021-11-07 ENCOUNTER — Ambulatory Visit (HOSPITAL_BASED_OUTPATIENT_CLINIC_OR_DEPARTMENT_OTHER): Payer: Medicare Other | Admitting: Certified Registered Nurse Anesthetist

## 2021-11-07 DIAGNOSIS — M25761 Osteophyte, right knee: Secondary | ICD-10-CM | POA: Diagnosis not present

## 2021-11-07 DIAGNOSIS — J449 Chronic obstructive pulmonary disease, unspecified: Secondary | ICD-10-CM | POA: Insufficient documentation

## 2021-11-07 DIAGNOSIS — Z7951 Long term (current) use of inhaled steroids: Secondary | ICD-10-CM | POA: Diagnosis not present

## 2021-11-07 DIAGNOSIS — M25461 Effusion, right knee: Secondary | ICD-10-CM

## 2021-11-07 DIAGNOSIS — M1711 Unilateral primary osteoarthritis, right knee: Secondary | ICD-10-CM

## 2021-11-07 DIAGNOSIS — M659 Synovitis and tenosynovitis, unspecified: Secondary | ICD-10-CM

## 2021-11-07 DIAGNOSIS — Z79899 Other long term (current) drug therapy: Secondary | ICD-10-CM | POA: Insufficient documentation

## 2021-11-07 DIAGNOSIS — Z96651 Presence of right artificial knee joint: Secondary | ICD-10-CM

## 2021-11-07 DIAGNOSIS — N189 Chronic kidney disease, unspecified: Secondary | ICD-10-CM | POA: Insufficient documentation

## 2021-11-07 DIAGNOSIS — Z96641 Presence of right artificial hip joint: Secondary | ICD-10-CM | POA: Diagnosis not present

## 2021-11-07 HISTORY — PX: TOTAL KNEE ARTHROPLASTY: SHX125

## 2021-11-07 LAB — COMPREHENSIVE METABOLIC PANEL
ALT: 15 U/L (ref 0–44)
AST: 20 U/L (ref 15–41)
Albumin: 3.6 g/dL (ref 3.5–5.0)
Alkaline Phosphatase: 68 U/L (ref 38–126)
Anion gap: 10 (ref 5–15)
BUN: 8 mg/dL (ref 6–20)
CO2: 25 mmol/L (ref 22–32)
Calcium: 8.6 mg/dL — ABNORMAL LOW (ref 8.9–10.3)
Chloride: 104 mmol/L (ref 98–111)
Creatinine, Ser: 0.71 mg/dL (ref 0.44–1.00)
GFR, Estimated: >60 mL/min (ref >60)
Glucose, Bld: 121 mg/dL — ABNORMAL HIGH (ref 70–99)
Potassium: 3.2 mmol/L — ABNORMAL LOW (ref 3.5–5.1)
Sodium: 139 mmol/L (ref 135–145)
Total Bilirubin: 0.5 mg/dL (ref 0.3–1.2)
Total Protein: 7.2 g/dL (ref 6.5–8.1)

## 2021-11-07 LAB — CBC
HCT: 36.3 % (ref 36.0–46.0)
Hemoglobin: 11 g/dL — ABNORMAL LOW (ref 12.0–15.0)
MCH: 22.3 pg — ABNORMAL LOW (ref 26.0–34.0)
MCHC: 30.3 g/dL (ref 30.0–36.0)
MCV: 73.5 fL — ABNORMAL LOW (ref 80.0–100.0)
Platelets: 140 10*3/uL — ABNORMAL LOW (ref 150–400)
RBC: 4.94 MIL/uL (ref 3.87–5.11)
RDW: 15.9 % — ABNORMAL HIGH (ref 11.5–15.5)
WBC: 6.3 10*3/uL (ref 4.0–10.5)
nRBC: 0 % (ref 0.0–0.2)

## 2021-11-07 LAB — GLUCOSE, CAPILLARY: Glucose-Capillary: 123 mg/dL — ABNORMAL HIGH (ref 70–99)

## 2021-11-07 SURGERY — ARTHROPLASTY, KNEE, TOTAL
Anesthesia: Spinal | Site: Knee | Laterality: Right

## 2021-11-07 MED ORDER — FENTANYL CITRATE PF 50 MCG/ML IJ SOSY
50.0000 ug | PREFILLED_SYRINGE | INTRAMUSCULAR | Status: DC
  Administered 2021-11-07: 50 ug via INTRAVENOUS
  Filled 2021-11-07: qty 2

## 2021-11-07 MED ORDER — CYCLOBENZAPRINE HCL 10 MG PO TABS
10.0000 mg | ORAL_TABLET | Freq: Three times a day (TID) | ORAL | Status: DC | PRN
  Administered 2021-11-07: 10 mg via ORAL
  Filled 2021-11-07: qty 1

## 2021-11-07 MED ORDER — MONTELUKAST SODIUM 10 MG PO TABS
10.0000 mg | ORAL_TABLET | Freq: Every morning | ORAL | Status: DC
  Administered 2021-11-08: 10 mg via ORAL
  Filled 2021-11-07: qty 1

## 2021-11-07 MED ORDER — MIDAZOLAM HCL 2 MG/2ML IJ SOLN
1.0000 mg | INTRAMUSCULAR | Status: DC
  Administered 2021-11-07: 2 mg via INTRAVENOUS
  Filled 2021-11-07: qty 2

## 2021-11-07 MED ORDER — FERROUS SULFATE 325 (65 FE) MG PO TABS
325.0000 mg | ORAL_TABLET | Freq: Three times a day (TID) | ORAL | Status: DC
  Administered 2021-11-07 – 2021-11-08 (×3): 325 mg via ORAL
  Filled 2021-11-07 (×3): qty 1

## 2021-11-07 MED ORDER — METOCLOPRAMIDE HCL 5 MG/ML IJ SOLN: 5.0000 mg | Freq: Three times a day (TID) | INTRAMUSCULAR | Status: DC | PRN

## 2021-11-07 MED ORDER — BUPIVACAINE-EPINEPHRINE (PF) 0.25% -1:200000 IJ SOLN
INTRAMUSCULAR | Status: AC
  Filled 2021-11-07: qty 30

## 2021-11-07 MED ORDER — DEXAMETHASONE SODIUM PHOSPHATE 10 MG/ML IJ SOLN
INTRAMUSCULAR | Status: AC
  Filled 2021-11-07: qty 1

## 2021-11-07 MED ORDER — STERILE WATER FOR IRRIGATION IR SOLN
Status: DC | PRN
  Administered 2021-11-07: 2000 mL

## 2021-11-07 MED ORDER — BUPIVACAINE HCL (PF) 0.5 % IJ SOLN
INTRAMUSCULAR | Status: DC | PRN
  Administered 2021-11-07: 20 mL via PERINEURAL

## 2021-11-07 MED ORDER — FOLIC ACID 1 MG PO TABS
1.0000 mg | ORAL_TABLET | Freq: Every morning | ORAL | Status: DC
Start: 2021-11-08 — End: 2021-11-08
  Administered 2021-11-08: 1 mg via ORAL
  Filled 2021-11-07: qty 1

## 2021-11-07 MED ORDER — ONDANSETRON HCL 4 MG/2ML IJ SOLN: 4.0000 mg | Freq: Once | INTRAMUSCULAR | Status: DC | PRN

## 2021-11-07 MED ORDER — VENLAFAXINE HCL ER 75 MG PO CP24
75.0000 mg | ORAL_CAPSULE | Freq: Every day | ORAL | Status: DC
  Administered 2021-11-08: 75 mg via ORAL
  Filled 2021-11-07: qty 1

## 2021-11-07 MED ORDER — PHENYLEPHRINE 40 MCG/ML (10ML) SYRINGE FOR IV PUSH (FOR BLOOD PRESSURE SUPPORT)
PREFILLED_SYRINGE | INTRAVENOUS | Status: AC
  Filled 2021-11-07: qty 10

## 2021-11-07 MED ORDER — DOCUSATE SODIUM 100 MG PO CAPS
100.0000 mg | ORAL_CAPSULE | Freq: Two times a day (BID) | ORAL | Status: DC
  Filled 2021-11-07 (×2): qty 1

## 2021-11-07 MED ORDER — ACETAMINOPHEN 325 MG PO TABS: 325.0000 mg | ORAL_TABLET | Freq: Four times a day (QID) | ORAL | Status: DC | PRN

## 2021-11-07 MED ORDER — SODIUM CHLORIDE (PF) 0.9 % IJ SOLN
INTRAMUSCULAR | Status: AC
  Filled 2021-11-07: qty 30

## 2021-11-07 MED ORDER — LACTATED RINGERS IV SOLN: INTRAVENOUS | Status: DC

## 2021-11-07 MED ORDER — PREGABALIN 75 MG PO CAPS
150.0000 mg | ORAL_CAPSULE | Freq: Every day | ORAL | Status: DC
  Administered 2021-11-07: 150 mg via ORAL
  Filled 2021-11-07: qty 2

## 2021-11-07 MED ORDER — ACETAMINOPHEN 10 MG/ML IV SOLN: 1000.0000 mg | Freq: Once | INTRAVENOUS | Status: DC | PRN

## 2021-11-07 MED ORDER — NORTRIPTYLINE HCL 10 MG PO CAPS
20.0000 mg | ORAL_CAPSULE | Freq: Every day | ORAL | Status: DC
  Administered 2021-11-07: 20 mg via ORAL
  Filled 2021-11-07 (×2): qty 2

## 2021-11-07 MED ORDER — PHENYLEPHRINE HCL-NACL 20-0.9 MG/250ML-% IV SOLN
INTRAVENOUS | Status: DC | PRN
  Administered 2021-11-07: 50 ug/min via INTRAVENOUS

## 2021-11-07 MED ORDER — ALBUTEROL SULFATE (2.5 MG/3ML) 0.083% IN NEBU: 3.0000 mL | INHALATION_SOLUTION | Freq: Four times a day (QID) | RESPIRATORY_TRACT | Status: DC | PRN

## 2021-11-07 MED ORDER — BUPIVACAINE-EPINEPHRINE (PF) 0.25% -1:200000 IJ SOLN
INTRAMUSCULAR | Status: DC | PRN
  Administered 2021-11-07: 30 mL

## 2021-11-07 MED ORDER — POVIDONE-IODINE 10 % EX SWAB
2.0000 "application " | Freq: Once | CUTANEOUS | Status: AC
  Administered 2021-11-07: 2 via TOPICAL

## 2021-11-07 MED ORDER — KETOROLAC TROMETHAMINE 30 MG/ML IJ SOLN
INTRAMUSCULAR | Status: AC
  Filled 2021-11-07: qty 1

## 2021-11-07 MED ORDER — PROPOFOL 500 MG/50ML IV EMUL
INTRAVENOUS | Status: DC | PRN
  Administered 2021-11-07: 75 ug/kg/min via INTRAVENOUS

## 2021-11-07 MED ORDER — KETOROLAC TROMETHAMINE 15 MG/ML IJ SOLN
7.5000 mg | Freq: Four times a day (QID) | INTRAMUSCULAR | Status: DC
  Administered 2021-11-07 – 2021-11-08 (×3): 7.5 mg via INTRAVENOUS
  Filled 2021-11-07 (×3): qty 1

## 2021-11-07 MED ORDER — CHLORHEXIDINE GLUCONATE 0.12 % MT SOLN
15.0000 mL | Freq: Once | OROMUCOSAL | Status: AC
  Administered 2021-11-07: 15 mL via OROMUCOSAL

## 2021-11-07 MED ORDER — ONDANSETRON HCL 4 MG/2ML IJ SOLN
INTRAMUSCULAR | Status: AC
  Filled 2021-11-07: qty 2

## 2021-11-07 MED ORDER — DEXAMETHASONE SODIUM PHOSPHATE 10 MG/ML IJ SOLN
8.0000 mg | Freq: Once | INTRAMUSCULAR | Status: AC
  Administered 2021-11-07: 8 mg via INTRAVENOUS

## 2021-11-07 MED ORDER — LINACLOTIDE 72 MCG PO CAPS
72.0000 ug | ORAL_CAPSULE | Freq: Every day | ORAL | Status: DC
  Administered 2021-11-08: 72 ug via ORAL
  Filled 2021-11-07: qty 1

## 2021-11-07 MED ORDER — ONDANSETRON HCL 4 MG/2ML IJ SOLN: 4.0000 mg | Freq: Four times a day (QID) | INTRAMUSCULAR | Status: DC | PRN

## 2021-11-07 MED ORDER — OXYCODONE HCL 5 MG PO TABS
5.0000 mg | ORAL_TABLET | ORAL | Status: DC | PRN
  Administered 2021-11-07 – 2021-11-08 (×3): 10 mg via ORAL
  Filled 2021-11-07 (×3): qty 2

## 2021-11-07 MED ORDER — PHENYLEPHRINE HCL-NACL 20-0.9 MG/250ML-% IV SOLN
INTRAVENOUS | Status: AC
  Filled 2021-11-07: qty 250

## 2021-11-07 MED ORDER — TRANEXAMIC ACID-NACL 1000-0.7 MG/100ML-% IV SOLN
1000.0000 mg | INTRAVENOUS | Status: AC
  Administered 2021-11-07: 1000 mg via INTRAVENOUS
  Filled 2021-11-07: qty 100

## 2021-11-07 MED ORDER — TRANEXAMIC ACID-NACL 1000-0.7 MG/100ML-% IV SOLN
1000.0000 mg | Freq: Once | INTRAVENOUS | Status: AC
  Administered 2021-11-07: 1000 mg via INTRAVENOUS
  Filled 2021-11-07: qty 100

## 2021-11-07 MED ORDER — HYDROMORPHONE HCL 1 MG/ML IJ SOLN
0.5000 mg | INTRAMUSCULAR | Status: DC | PRN
  Administered 2021-11-07: 1 mg via INTRAVENOUS
  Filled 2021-11-07: qty 1

## 2021-11-07 MED ORDER — BISACODYL 10 MG RE SUPP: 10.0000 mg | Freq: Every day | RECTAL | Status: DC | PRN

## 2021-11-07 MED ORDER — PHENOL 1.4 % MT LIQD: 1.0000 | OROMUCOSAL | Status: DC | PRN

## 2021-11-07 MED ORDER — ORAL CARE MOUTH RINSE: 15.0000 mL | Freq: Once | OROMUCOSAL | Status: AC

## 2021-11-07 MED ORDER — PHENYLEPHRINE HCL-NACL 20-0.9 MG/250ML-% IV SOLN: INTRAVENOUS | Status: DC | PRN

## 2021-11-07 MED ORDER — KETOROLAC TROMETHAMINE 30 MG/ML IJ SOLN
INTRAMUSCULAR | Status: DC | PRN
  Administered 2021-11-07: 30 mg

## 2021-11-07 MED ORDER — HYDROMORPHONE HCL 1 MG/ML IJ SOLN: 0.2500 mg | INTRAMUSCULAR | Status: DC | PRN

## 2021-11-07 MED ORDER — CEFAZOLIN SODIUM-DEXTROSE 2-4 GM/100ML-% IV SOLN
2.0000 g | Freq: Four times a day (QID) | INTRAVENOUS | Status: AC
  Administered 2021-11-07 (×2): 2 g via INTRAVENOUS
  Filled 2021-11-07 (×2): qty 100

## 2021-11-07 MED ORDER — DIPHENHYDRAMINE HCL 12.5 MG/5ML PO ELIX: 12.5000 mg | ORAL_SOLUTION | ORAL | Status: DC | PRN

## 2021-11-07 MED ORDER — METOCLOPRAMIDE HCL 5 MG PO TABS: 5.0000 mg | ORAL_TABLET | Freq: Three times a day (TID) | ORAL | Status: DC | PRN

## 2021-11-07 MED ORDER — SODIUM CHLORIDE 0.9 % IV SOLN: INTRAVENOUS | Status: DC

## 2021-11-07 MED ORDER — PROPOFOL 1000 MG/100ML IV EMUL
INTRAVENOUS | Status: AC
  Filled 2021-11-07: qty 100

## 2021-11-07 MED ORDER — CEFAZOLIN SODIUM-DEXTROSE 2-4 GM/100ML-% IV SOLN
2.0000 g | INTRAVENOUS | Status: AC
  Administered 2021-11-07: 2 g via INTRAVENOUS
  Filled 2021-11-07: qty 100

## 2021-11-07 MED ORDER — ONDANSETRON HCL 4 MG/2ML IJ SOLN
INTRAMUSCULAR | Status: DC | PRN
  Administered 2021-11-07: 4 mg via INTRAVENOUS

## 2021-11-07 MED ORDER — DEXAMETHASONE SODIUM PHOSPHATE 10 MG/ML IJ SOLN
10.0000 mg | Freq: Once | INTRAMUSCULAR | Status: AC
  Administered 2021-11-08: 10 mg via INTRAVENOUS
  Filled 2021-11-07: qty 1

## 2021-11-07 MED ORDER — OXYCODONE HCL 5 MG PO TABS: 10.0000 mg | ORAL_TABLET | ORAL | Status: DC | PRN

## 2021-11-07 MED ORDER — LORATADINE 10 MG PO TABS
10.0000 mg | ORAL_TABLET | Freq: Every day | ORAL | Status: DC
  Filled 2021-11-07: qty 1

## 2021-11-07 MED ORDER — POLYETHYLENE GLYCOL 3350 17 G PO PACK: 17.0000 g | PACK | Freq: Every day | ORAL | Status: DC | PRN

## 2021-11-07 MED ORDER — ASPIRIN 81 MG PO CHEW
81.0000 mg | CHEWABLE_TABLET | Freq: Two times a day (BID) | ORAL | Status: DC
  Administered 2021-11-07 – 2021-11-08 (×2): 81 mg via ORAL
  Filled 2021-11-07 (×2): qty 1

## 2021-11-07 MED ORDER — CYCLOBENZAPRINE HCL 10 MG PO TABS: 20.0000 mg | ORAL_TABLET | Freq: Three times a day (TID) | ORAL | Status: DC | PRN

## 2021-11-07 MED ORDER — BUPIVACAINE IN DEXTROSE 0.75-8.25 % IT SOLN
INTRATHECAL | Status: DC | PRN
  Administered 2021-11-07: 1.6 mL via INTRATHECAL

## 2021-11-07 MED ORDER — SODIUM CHLORIDE (PF) 0.9 % IJ SOLN
INTRAMUSCULAR | Status: DC | PRN
  Administered 2021-11-07: 30 mL

## 2021-11-07 MED ORDER — OXYBUTYNIN CHLORIDE ER 5 MG PO TB24
5.0000 mg | ORAL_TABLET | Freq: Every day | ORAL | Status: DC
  Administered 2021-11-07: 5 mg via ORAL
  Filled 2021-11-07: qty 1

## 2021-11-07 MED ORDER — ONDANSETRON HCL 4 MG PO TABS: 4.0000 mg | ORAL_TABLET | Freq: Four times a day (QID) | ORAL | Status: DC | PRN

## 2021-11-07 MED ORDER — 0.9 % SODIUM CHLORIDE (POUR BTL) OPTIME
TOPICAL | Status: DC | PRN
Start: 2021-11-07 — End: 2021-11-07
  Administered 2021-11-07 (×2): 1000 mL

## 2021-11-07 MED ORDER — FAMOTIDINE 20 MG PO TABS
40.0000 mg | ORAL_TABLET | Freq: Every day | ORAL | Status: DC
  Administered 2021-11-07: 40 mg via ORAL
  Filled 2021-11-07: qty 2

## 2021-11-07 MED ORDER — PHENYLEPHRINE 40 MCG/ML (10ML) SYRINGE FOR IV PUSH (FOR BLOOD PRESSURE SUPPORT)
PREFILLED_SYRINGE | INTRAVENOUS | Status: DC | PRN
  Administered 2021-11-07: 120 ug via INTRAVENOUS

## 2021-11-07 MED ORDER — ATORVASTATIN CALCIUM 10 MG PO TABS
10.0000 mg | ORAL_TABLET | Freq: Every morning | ORAL | Status: DC
  Administered 2021-11-08: 10 mg via ORAL
  Filled 2021-11-07: qty 1

## 2021-11-07 MED ORDER — MOMETASONE FURO-FORMOTEROL FUM 200-5 MCG/ACT IN AERO
2.0000 | INHALATION_SPRAY | Freq: Two times a day (BID) | RESPIRATORY_TRACT | Status: DC
  Administered 2021-11-07 – 2021-11-08 (×2): 2 via RESPIRATORY_TRACT
  Filled 2021-11-07: qty 8.8

## 2021-11-07 MED ORDER — MENTHOL 3 MG MT LOZG: 1.0000 | LOZENGE | OROMUCOSAL | Status: DC | PRN

## 2021-11-07 MED ORDER — TOPIRAMATE 100 MG PO TABS
200.0000 mg | ORAL_TABLET | Freq: Every day | ORAL | Status: DC
  Administered 2021-11-07: 200 mg via ORAL
  Filled 2021-11-07: qty 2

## 2021-11-07 MED ORDER — IPRATROPIUM BROMIDE 0.02 % IN SOLN: 2.5000 mL | Freq: Four times a day (QID) | RESPIRATORY_TRACT | Status: DC | PRN

## 2021-11-07 SURGICAL SUPPLY — 55 items
ADH SKN CLS APL DERMABOND .7 (GAUZE/BANDAGES/DRESSINGS) ×1
ATTUNE MED ANAT PAT 32 KNEE (Knees) ×1 IMPLANT
ATTUNE PSFEM RTSZ4 NARCEM KNEE (Femur) ×1 IMPLANT
ATTUNE PSRP INSR SZ4 6 KNEE (Insert) ×1 IMPLANT
BAG COUNTER SPONGE SURGICOUNT (BAG) ×1 IMPLANT
BAG SPEC THK2 15X12 ZIP CLS (MISCELLANEOUS) ×1
BAG SPNG CNTER NS LX DISP (BAG) ×1
BAG ZIPLOCK 12X15 (MISCELLANEOUS) ×1 IMPLANT
BASE TIBIAL ROT PLAT SZ 3 KNEE (Knees) IMPLANT
BLADE SAW SGTL 11.0X1.19X90.0M (BLADE) IMPLANT
BLADE SAW SGTL 13.0X1.19X90.0M (BLADE) ×3 IMPLANT
BLADE SURG SZ10 CARB STEEL (BLADE) ×6 IMPLANT
BNDG ELASTIC 6X5.8 VLCR STR LF (GAUZE/BANDAGES/DRESSINGS) ×3 IMPLANT
BOWL SMART MIX CTS (DISPOSABLE) ×3 IMPLANT
BSPLAT TIB 3 CMNT ROT PLAT STR (Knees) ×1 IMPLANT
CEMENT HV SMART SET (Cement) ×2 IMPLANT
CUFF TOURN SGL QUICK 34 (TOURNIQUET CUFF) ×2
CUFF TRNQT CYL 34X4.125X (TOURNIQUET CUFF) ×2 IMPLANT
DERMABOND ADVANCED (GAUZE/BANDAGES/DRESSINGS) ×1
DERMABOND ADVANCED .7 DNX12 (GAUZE/BANDAGES/DRESSINGS) ×2 IMPLANT
DRAPE INCISE IOBAN 66X45 STRL (DRAPES) ×3 IMPLANT
DRAPE U-SHAPE 47X51 STRL (DRAPES) ×3 IMPLANT
DRESSING AQUACEL AG SP 3.5X10 (GAUZE/BANDAGES/DRESSINGS) ×2 IMPLANT
DRSG AQUACEL AG ADV 3.5X10 (GAUZE/BANDAGES/DRESSINGS) ×1 IMPLANT
DRSG AQUACEL AG SP 3.5X10 (GAUZE/BANDAGES/DRESSINGS) ×2
DURAPREP 26ML APPLICATOR (WOUND CARE) ×6 IMPLANT
ELECT REM PT RETURN 15FT ADLT (MISCELLANEOUS) ×3 IMPLANT
GLOVE SURG ENC MOIS LTX SZ6 (GLOVE) ×3 IMPLANT
GLOVE SURG ENC MOIS LTX SZ7 (GLOVE) ×3 IMPLANT
GLOVE SURG UNDER POLY LF SZ7.5 (GLOVE) ×3 IMPLANT
GOWN STRL REUS W/TWL LRG LVL3 (GOWN DISPOSABLE) ×3 IMPLANT
HANDPIECE INTERPULSE COAX TIP (DISPOSABLE) ×2
HOLDER FOLEY CATH W/STRAP (MISCELLANEOUS) ×1 IMPLANT
KIT TURNOVER KIT A (KITS) ×1 IMPLANT
MANIFOLD NEPTUNE II (INSTRUMENTS) ×3 IMPLANT
NDL SAFETY ECLIPSE 18X1.5 (NEEDLE) IMPLANT
NEEDLE HYPO 18GX1.5 SHARP (NEEDLE) ×2
NS IRRIG 1000ML POUR BTL (IV SOLUTION) ×3 IMPLANT
PACK TOTAL KNEE CUSTOM (KITS) ×3 IMPLANT
PROTECTOR NERVE ULNAR (MISCELLANEOUS) ×3 IMPLANT
SET HNDPC FAN SPRY TIP SCT (DISPOSABLE) ×2 IMPLANT
SET PAD KNEE POSITIONER (MISCELLANEOUS) ×3 IMPLANT
SPIKE FLUID TRANSFER (MISCELLANEOUS) ×6 IMPLANT
SPONGE T-LAP 18X18 ~~LOC~~+RFID (SPONGE) ×9 IMPLANT
SUT MNCRL AB 4-0 PS2 18 (SUTURE) ×3 IMPLANT
SUT STRATAFIX PDS+ 0 24IN (SUTURE) ×3 IMPLANT
SUT VIC AB 1 CT1 36 (SUTURE) ×3 IMPLANT
SUT VIC AB 2-0 CT1 27 (SUTURE) ×6
SUT VIC AB 2-0 CT1 TAPERPNT 27 (SUTURE) ×6 IMPLANT
SYR 3ML LL SCALE MARK (SYRINGE) ×3 IMPLANT
TIBIAL BASE ROT PLAT SZ 3 KNEE (Knees) ×2 IMPLANT
TRAY FOLEY MTR SLVR 16FR STAT (SET/KITS/TRAYS/PACK) ×3 IMPLANT
TUBE SUCTION HIGH CAP CLEAR NV (SUCTIONS) ×3 IMPLANT
WATER STERILE IRR 1000ML POUR (IV SOLUTION) ×6 IMPLANT
WRAP KNEE MAXI GEL POST OP (GAUZE/BANDAGES/DRESSINGS) ×3 IMPLANT

## 2021-11-07 NOTE — Anesthesia Procedure Notes (Signed)
Spinal  Patient location during procedure: OR Start time: 11/07/2021 10:13 AM End time: 11/07/2021 10:17 AM Reason for block: surgical anesthesia Staffing Performed: resident/CRNA  Resident/CRNA: Claudia Desanctis, CRNA Preanesthetic Checklist Completed: patient identified, IV checked, site marked, risks and benefits discussed, surgical consent, monitors and equipment checked, pre-op evaluation and timeout performed Spinal Block Patient position: sitting Prep: DuraPrep Patient monitoring: heart rate, cardiac monitor, continuous pulse ox and blood pressure Approach: midline Location: L3-4 Injection technique: single-shot Needle Needle type: Sprotte and Pencan  Needle gauge: 24 G Needle length: 10 cm Needle insertion depth: 9 cm Assessment Sensory level: T4 Events: CSF return

## 2021-11-07 NOTE — Interval H&P Note (Signed)
History and Physical Interval Note:  11/07/2021 8:40 AM  Christine Bradley  has presented today for surgery, with the diagnosis of Right knee osteoarthritis.  The various methods of treatment have been discussed with the patient and family. After consideration of risks, benefits and other options for treatment, the patient has consented to  Procedure(s): TOTAL KNEE ARTHROPLASTY (Right) as a surgical intervention.  The patient's history has been reviewed, patient examined, no change in status, stable for surgery.  I have reviewed the patient's chart and labs.  Questions were answered to the patient's satisfaction.     [name]

## 2021-11-07 NOTE — Anesthesia Procedure Notes (Signed)
Procedure Name: MAC Date/Time: 11/07/2021 10:20 AM Performed by: Claudia Desanctis, CRNA Pre-anesthesia Checklist: Patient identified, Emergency Drugs available, Suction available and Patient being monitored Patient Re-evaluated:Patient Re-evaluated prior to induction Oxygen Delivery Method: Simple face mask

## 2021-11-07 NOTE — Discharge Instructions (Signed)

## 2021-11-07 NOTE — Evaluation (Signed)
Physical Therapy Evaluation Patient Details Name: Christine Bradley MRN: [id number] DOB: 1966-04-01 Today's Date: 11/07/2021  History of Present Illness  Christine Bradley is a 57 yo female s/p R TKA 11/07/21, H/O  R THA AA 08/29/21. PMH: CKD, COPD, fibromyalgia  Clinical Impression  The patient is eager to mobilize. Patient  required min assistance to sitting and ambulated x 5 ' to recliner using A RW.Marland Kitchen Patient should progress with continued PT in acute care  to DC home with family support.   Pt admitted with above diagnosis.  Pt currently with functional limitations due to the deficits listed below (see PT Problem List). Pt will benefit from skilled PT to increase their independence and safety with mobility to allow discharge to the venue listed below.        Recommendations for follow up therapy are one component of a multi-disciplinary discharge planning process, led by the attending physician.  Recommendations may be updated based on patient status, additional functional criteria and insurance authorization.  Follow Up Recommendations Follow physician's recommendations for discharge plan and follow up therapies    Assistance Recommended at Discharge Intermittent Supervision/Assistance  Patient can return home with the following  A little help with walking and/or transfers;Assist for transportation;Assistance with cooking/housework;A little help with bathing/dressing/bathroom;Help with stairs or ramp for entrance    Equipment Recommendations BSC/3in1  Recommendations for Other Services       Functional Status Assessment Patient has had a recent decline in their functional status and demonstrates the ability to make significant improvements in function in a reasonable and predictable amount of time.     Precautions / Restrictions Precautions Precautions: Fall;Knee Restrictions Weight Bearing Restrictions: No      Mobility  Bed Mobility Overal bed mobility: Needs Assistance Bed Mobility:  Supine to Sit     Supine to sit: Min assist     General bed mobility comments: support right  leg, patient used left foot to guide right foot to floor slowly.    Transfers Overall transfer level: Needs assistance Equipment used: Rolling walker (2 wheels) Transfers: Sit to/from Stand Sit to Stand: Min assist           General transfer comment: cues for hand placement and right leg prior to sitting,    Ambulation/Gait Ambulation/Gait assistance: Min assist Gait Distance (Feet): 5 Feet Assistive device: Rolling walker (2 wheels) Gait Pattern/deviations: Step-to pattern, Antalgic       General Gait Details: cues for posture  Stairs            Wheelchair Mobility    Modified Rankin (Stroke Patients Only)       Balance Overall balance assessment: Needs assistance Sitting-balance support: Feet supported, Bilateral upper extremity supported Sitting balance-Leahy Scale: Fair     Standing balance support: During functional activity, Bilateral upper extremity supported, Reliant on assistive device for balance Standing balance-Leahy Scale: Poor                               Pertinent Vitals/Pain Pain Assessment Pain Assessment: 0-10 Pain Score: 4  Pain Descriptors / Indicators: Discomfort, Grimacing Pain Intervention(s): Monitored during session, Premedicated before session, Ice applied, Limited activity within patient's tolerance    Home Living Family/patient expects to be discharged to:: Private residence Living Arrangements: Other relatives Available Help at Discharge: Family;Available 24 hours/day Type of Home: House Home Access: Level entry       Home Layout: One level Home Equipment:  Cane - single Barista (2 wheels)      Prior Function Prior Level of Function : Independent/Modified Independent               ADLs Comments: pt reports ind with ADLs/IADLs     Hand Dominance   Dominant Hand: Right     Extremity/Trunk Assessment   Upper Extremity Assessment Upper Extremity Assessment: Overall WFL for tasks assessed    Lower Extremity Assessment Lower Extremity Assessment: RLE deficits/detail RLE Deficits / Details: -5-60 knee flex    Cervical / Trunk Assessment Cervical / Trunk Assessment: Normal  Communication   Communication: No difficulties  Cognition Arousal/Alertness: Awake/alert Behavior During Therapy: WFL for tasks assessed/performed Overall Cognitive Status: Within Functional Limits for tasks assessed                                          General Comments      Exercises     Assessment/Plan    PT Assessment Patient needs continued PT services  PT Problem List Decreased strength;Decreased mobility;Decreased range of motion;Decreased activity tolerance;Decreased knowledge of precautions;Decreased knowledge of use of DME;Pain       PT Treatment Interventions DME instruction;Therapeutic activities;Gait training;Therapeutic exercise;Functional mobility training    PT Goals (Current goals can be found in the Care Plan section)  Acute Rehab PT Goals Patient Stated Goal: to get the other knee done PT Goal Formulation: With patient Time For Goal Achievement: 11/14/21 Potential to Achieve Goals: Good    Frequency 7X/week     Co-evaluation               AM-PAC PT "6 Clicks" Mobility  Outcome Measure Help needed turning from your back to your side while in a flat bed without using bedrails?: A Little Help needed moving from lying on your back to sitting on the side of a flat bed without using bedrails?: A Little Help needed moving to and from a bed to a chair (including a wheelchair)?: A Little Help needed standing up from a chair using your arms (e.g., wheelchair or bedside chair)?: A Little Help needed to walk in hospital room?: A Lot Help needed climbing 3-5 steps with a railing? : A Lot 6 Click Score: 16    End of Session  Equipment Utilized During Treatment: Gait belt Activity Tolerance: Patient tolerated treatment well Patient left: in chair;with call bell/phone within reach;with chair alarm set Nurse Communication: Mobility status PT Visit Diagnosis: Unsteadiness on feet (R26.81);Difficulty in walking, not elsewhere classified (R26.2);Pain Pain - Right/Left: Right Pain - part of body: Knee    Time: 9030-0923 PT Time Calculation (min) (ACUTE ONLY): 24 min   Charges:   PT Evaluation $PT Eval Low Complexity: 1 Low PT Treatments $Gait Training: 8-22 mins        Tresa Endo PT Acute Rehabilitation Services Pager [phone] Office [phone]   Claretha Cooper 11/07/2021, 6:22 PM

## 2021-11-07 NOTE — Anesthesia Preprocedure Evaluation (Signed)
Anesthesia Evaluation  Patient identified by MRN, date of birth, ID band Patient awake    Reviewed: Allergy & Precautions, NPO status , Patient's Chart, lab work & pertinent test results  Airway Mallampati: II  TM Distance: >3 FB Neck ROM: Full    Dental no notable dental hx.    Pulmonary COPD,    Pulmonary exam normal breath sounds clear to auscultation       Cardiovascular negative cardio ROS Normal cardiovascular exam Rhythm:Regular Rate:Normal     Neuro/Psych negative neurological ROS  negative psych ROS   GI/Hepatic Neg liver ROS, GERD  ,  Endo/Other  negative endocrine ROS  Renal/GU negative Renal ROS  negative genitourinary   Musculoskeletal  (+) Arthritis , Osteoarthritis,    Abdominal   Peds negative pediatric ROS (+)  Hematology negative hematology ROS (+)   Anesthesia Other Findings   Reproductive/Obstetrics negative OB ROS                             Anesthesia Physical Anesthesia Plan  ASA: 2  Anesthesia Plan: Spinal   Post-op Pain Management: Regional block*   Induction: Intravenous  PONV Risk Score and Plan: 2 and Ondansetron, Dexamethasone and Treatment may vary due to age or medical condition  Airway Management Planned: Simple Face Mask  Additional Equipment:   Intra-op Plan:   Post-operative Plan:   Informed Consent: I have reviewed the patients History and Physical, chart, labs and discussed the procedure including the risks, benefits and alternatives for the proposed anesthesia with the patient or authorized representative who has indicated his/her understanding and acceptance.     Dental advisory given  Plan Discussed with: CRNA and Surgeon  Anesthesia Plan Comments:         Anesthesia Quick Evaluation

## 2021-11-07 NOTE — Anesthesia Postprocedure Evaluation (Signed)
Anesthesia Post Note  Patient: Christine Bradley  Procedure(s) Performed: TOTAL KNEE ARTHROPLASTY (Right: Knee)     Patient location during evaluation: PACU Anesthesia Type: Spinal Level of consciousness: oriented and awake and alert Pain management: pain level controlled Vital Signs Assessment: post-procedure vital signs reviewed and stable Respiratory status: spontaneous breathing, respiratory function stable and patient connected to nasal cannula oxygen Cardiovascular status: blood pressure returned to baseline and stable Postop Assessment: no headache, no backache and no apparent nausea or vomiting Anesthetic complications: no   No notable events documented.  Last Vitals:  Vitals:   11/07/21 1220 11/07/21 1230  BP:  123/86  Pulse: 98 91  Resp: 13 12  Temp:    SpO2: 97% 99%    Last Pain:  Vitals:   11/07/21 1230  TempSrc:   PainSc: 0-No pain                 Macarius Ruark S

## 2021-11-07 NOTE — Anesthesia Procedure Notes (Signed)
Anesthesia Regional Block: Adductor canal block   Pre-Anesthetic Checklist: , timeout performed,  Correct Patient, Correct Site, Correct Laterality,  Correct Procedure, Correct Position, site marked,  Risks and benefits discussed,  Surgical consent,  Pre-op evaluation,  At surgeon's request and post-op pain management  Laterality: Right  Prep: chloraprep       Needles:  Injection technique: Single-shot  Needle Type: Echogenic Needle     Needle Length: 9cm      Additional Needles:   Procedures:,,,, ultrasound used (permanent image in chart),,    Narrative:  Start time: 11/07/2021 9:37 AM End time: 11/07/2021 9:42 AM Injection made incrementally with aspirations every 5 mL.  Performed by: Personally  Anesthesiologist: Myrtie Soman, MD  Additional Notes: Patient tolerated the procedure well without complications

## 2021-11-07 NOTE — Anesthesia Procedure Notes (Signed)
Anesthesia Procedure Image    

## 2021-11-07 NOTE — Op Note (Signed)
NAME:  Christine Bradley                      MEDICAL RECORD NO.:  [id number]                             FACILITY:  [facility]      PHYSICIAN:  Pietro Cassis. Alvan Dame, M.D.  DATE OF BIRTH:  July 31, 1966      DATE OF PROCEDURE:  11/07/2021                                     OPERATIVE REPORT         PREOPERATIVE DIAGNOSIS:  Right knee osteoarthritis.      POSTOPERATIVE DIAGNOSIS:  Right knee osteoarthritis.      FINDINGS:  The patient was noted to have complete loss of cartilage and   bone-on-bone arthritis with associated osteophytes in the patellofemoral and lateral compartments of   the knee with a significant synovitis and associated effusion.  The patient had failed months of conservative treatment including medications, injection therapy, activity modification.     PROCEDURE:  Right total knee replacement.      COMPONENTS USED:  DePuy Attune rotating platform posterior stabilized knee   system, a size 4N femur, 3 tibia, size 6 mm PS AOX insert, and 32 anatomic patellar   button.      SURGEON:  Pietro Cassis. Alvan Dame, M.D.      ASSISTANT:  Costella Hatcher, PA-C.      ANESTHESIA:  Regional and Spinal.      SPECIMENS:  None.      COMPLICATION:  None.      DRAINS:  None.  EBL: <100 cc      TOURNIQUET TIME:   Total Tourniquet Time Documented: Thigh (Right) - 30 minutes Total: Thigh (Right) - 30 minutes  .      The patient was stable to the recovery room.      INDICATION FOR PROCEDURE:  Christine Bradley is a 56 y.o. female patient of   mine.  The patient had been seen, evaluated, and treated for months conservatively in the   office with medication, activity modification, and injections.  The patient had   radiographic changes of bone-on-bone arthritis with endplate sclerosis and osteophytes noted.  Based on the radiographic changes and failed conservative measures, the patient   decided to proceed with definitive treatment, total knee replacement.  Risks of infection, DVT, component failure, need  for revision surgery, neurovascular injury were reviewed in the office setting.  The postop course was reviewed stressing the efforts to maximize post-operative satisfaction and function.  Consent was obtained for benefit of pain   relief.      PROCEDURE IN DETAIL:  The patient was brought to the operative theater.   Once adequate anesthesia, preoperative antibiotics, 2 gm of Ancef,1 gm of Tranexamic Acid, and 10 mg of Decadron administered, the patient was positioned supine with a right thigh tourniquet placed.  The  right lower extremity was prepped and draped in sterile fashion.  A time-   out was performed identifying the patient, planned procedure, and the appropriate extremity.      The right lower extremity was placed in the [facility] leg holder.  The leg was   exsanguinated, tourniquet elevated to 250 mmHg.  A midline incision was  made followed by median parapatellar arthrotomy.  Following initial   exposure, attention was first directed to the patella.  The lateral facet of the patella was significantly worn.  Precut   measurement was noted to be 17 mm at the median ridge.  I resected down to 13 mm finding still significant erosion laterally.  I selected a   32 anatomic patellar button to restore patellar height as well as cover the cut surface.  It was positioned superior and medially then allowing for resection of the significantly effected lateral facet.     The lug holes were drilled and a metal shim was placed to protect the   patella from retractors and saw blade during the procedure.      At this point, attention was now directed to the femur.  The femoral   canal was opened with a drill, irrigated to try to prevent fat emboli.  An   intramedullary rod was passed at 3 degrees valgus, 9 mm of bone was   resected off the distal femur.  Following this resection, the tibia was   subluxated anteriorly.  Using the extramedullary guide, 3 mm of bone was resected off   the proximal  medial tibia.  We confirmed the gap would be   stable medially and laterally with a size 5 spacer block as well as confirmed that the tibial cut was perpendicular in the coronal plane, checking with an alignment rod.      Once this was done, I sized the femur to be a size 4 in the anterior-   posterior dimension, chose a narrow component based on medial and   lateral dimension.  The size 4 rotation block was then pinned in   position anterior referenced using the C-clamp to set rotation.  The   anterior, posterior, and  chamfer cuts were made without difficulty nor   notching making certain that I was along the anterior cortex to help   with flexion gap stability.      The final box cut was made off the lateral aspect of distal femur.      At this point, the tibia was sized to be a size 3.  The size 3 tray was   then pinned in position through the medial third of the tubercle,   drilled, and keel punched.  Trial reduction was now carried with a 4 femur,  3 tibia, a size 6 mm PS insert, and the 32 anatomic patella botton.  The knee was brought to full extension with good flexion stability with the patella   tracking through the trochlea without application of pressure.  Given   all these findings the trial components removed.  Final components were   opened and cement was mixed.  The knee was irrigated with normal saline solution and pulse lavage.  The synovial lining was   then injected with 30 cc of 0.25% Marcaine with epinephrine, 1 cc of Toradol and 30 cc of NS for a total of 61 cc.     Final implants were then cemented onto cleaned and dried cut surfaces of bone with the knee brought to extension with a size 6 mm PS trial insert.      Once the cement had fully cured, excess cement was removed   throughout the knee.  I confirmed that I was satisfied with the range of   motion and stability, and the final size 6 mm PS AOX insert was chosen.  It was  placed into the knee.      The  tourniquet had been let down at 30 minutes.  No significant   hemostasis was required.  The extensor mechanism was then reapproximated using #1 Vicryl and #1 Stratafix sutures with the knee   in flexion.  The   remaining wound was closed with 2-0 Vicryl and running 4-0 Monocryl.   The knee was cleaned, dried, dressed sterilely using Dermabond and   Aquacel dressing.  The patient was then   brought to recovery room in stable condition, tolerating the procedure   well.   Please note that Physician Assistant, Costella Hatcher, PA-C was present for the entirety of the case, and was utilized for pre-operative positioning, peri-operative retractor management, general facilitation of the procedure and for primary wound closure at the end of the case.              Pietro Cassis Alvan Dame, M.D.    11/07/2021 11:26 AM

## 2021-11-07 NOTE — Transfer of Care (Signed)
Immediate Anesthesia Transfer of Care Note  Patient: Christine Bradley  Procedure(s) Performed: TOTAL KNEE ARTHROPLASTY (Right: Knee)  Patient Location: PACU  Anesthesia Type:Spinal  Level of Consciousness: awake, alert , oriented and patient cooperative  Airway & Oxygen Therapy: Patient Spontanous Breathing and Patient connected to face mask  Post-op Assessment: Report given to RN and Post -op Vital signs reviewed and stable  Post vital signs: Reviewed and stable  Last Vitals:  Vitals Value Taken Time  BP 123/105 11/07/21 1147  Temp    Pulse 107 11/07/21 1150  Resp 15 11/07/21 1150  SpO2 97 % 11/07/21 1150  Vitals shown include unvalidated device data.  Last Pain:  Vitals:   11/07/21 0945  TempSrc:   PainSc: Asleep      Patients Stated Pain Goal: 6 (44/81/85 6314)  Complications: No notable events documented.

## 2021-11-08 ENCOUNTER — Encounter (HOSPITAL_COMMUNITY): Payer: Self-pay | Admitting: Orthopedic Surgery

## 2021-11-08 DIAGNOSIS — M1711 Unilateral primary osteoarthritis, right knee: Secondary | ICD-10-CM | POA: Diagnosis not present

## 2021-11-08 LAB — CBC
HCT: 36.8 % (ref 36.0–46.0)
Hemoglobin: 10.8 g/dL — ABNORMAL LOW (ref 12.0–15.0)
MCH: 22.2 pg — ABNORMAL LOW (ref 26.0–34.0)
MCHC: 29.3 g/dL — ABNORMAL LOW (ref 30.0–36.0)
MCV: 75.6 fL — ABNORMAL LOW (ref 80.0–100.0)
Platelets: 248 10*3/uL (ref 150–400)
RBC: 4.87 MIL/uL (ref 3.87–5.11)
RDW: 15.8 % — ABNORMAL HIGH (ref 11.5–15.5)
WBC: 11.5 10*3/uL — ABNORMAL HIGH (ref 4.0–10.5)
nRBC: 0 % (ref 0.0–0.2)

## 2021-11-08 LAB — BASIC METABOLIC PANEL
Anion gap: 8 (ref 5–15)
BUN: 10 mg/dL (ref 6–20)
CO2: 24 mmol/L (ref 22–32)
Calcium: 8.4 mg/dL — ABNORMAL LOW (ref 8.9–10.3)
Chloride: 106 mmol/L (ref 98–111)
Creatinine, Ser: 0.69 mg/dL (ref 0.44–1.00)
GFR, Estimated: >60 mL/min (ref >60)
Glucose, Bld: 143 mg/dL — ABNORMAL HIGH (ref 70–99)
Potassium: 3.9 mmol/L (ref 3.5–5.1)
Sodium: 138 mmol/L (ref 135–145)

## 2021-11-08 MED ORDER — OXYCODONE HCL 5 MG PO TABS: 5.0000 mg | ORAL_TABLET | ORAL | 0 refills | Status: DC | PRN

## 2021-11-08 MED ORDER — POLYETHYLENE GLYCOL 3350 17 G PO PACK: 17.0000 g | PACK | Freq: Every day | ORAL | 0 refills | Status: DC | PRN

## 2021-11-08 MED ORDER — CYCLOBENZAPRINE HCL 5 MG PO TABS: 5.0000 mg | ORAL_TABLET | Freq: Three times a day (TID) | ORAL | 0 refills | Status: DC | PRN

## 2021-11-08 MED ORDER — ASPIRIN 81 MG PO CHEW: 81.0000 mg | CHEWABLE_TABLET | Freq: Two times a day (BID) | ORAL | 0 refills | Status: AC

## 2021-11-08 NOTE — TOC Transition Note (Signed)
Transition of Care [facility]) - CM/SW Discharge Note   Patient Details  Name: Christine Bradley MRN: [id number] Date of Birth: 08-21-1966  Transition of Care [facility]) CM/SW Contact:  Lennart Pall, LCSW Phone Number: 11/08/2021, 10:49 AM   Clinical Narrative:    Met with pt who reports she does need a 3n1 commode - no agency pref - referral to [facility] for delivery to room.  OPPT arrange with [facility]. No further needs.   Final next level of care: OP Rehab Barriers to Discharge: No Barriers Identified   Patient Goals and CMS Choice Patient states their goals for this hospitalization and ongoing recovery are:: return home      Discharge Placement                       Discharge Plan and Services                DME Arranged: 3-N-1 DME Agency: [employer] Date DME Agency Contacted: 11/08/21 Time DME Agency Contacted: [id number] Representative spoke with at DME Agency: [place] Interventions     Readmission Risk Interventions No flowsheet data found.

## 2021-11-08 NOTE — Progress Notes (Signed)
Subjective: 1 Day Post-Op Procedure(s) (LRB): TOTAL KNEE ARTHROPLASTY (Right) Patient reports pain as mild.   Patient seen in rounds with Dr. Alvan Dame. Patient is well, and has had no acute complaints or problems. No acute events overnight. Foley catheter removed. Patient ambulated 5 feet with PT.  We will continue therapy today.   Objective: Vital signs in last 24 hours: Temp:  [97.6 F (36.4 C)-98.1 F (36.7 C)] 97.9 F (36.6 C) (02/15 0638) Pulse Rate:  [71-107] 80 (02/15 0638) Resp:  [12-18] 16 (02/15 0638) BP: (121-157)/(72-116) 122/72 (02/15 0638) SpO2:  [93 %-100 %] 94 % (02/15 0638)  Intake/Output from previous day:  Intake/Output Summary (Last 24 hours) at 11/08/2021 0825 Last data filed at 11/08/2021 2536 Gross per 24 hour  Intake 3547.81 ml  Output 2050 ml  Net 1497.81 ml     Intake/Output this shift: No intake/output data recorded.  Labs: Recent Labs    11/07/21 1223 11/08/21 0339  HGB 11.0* 10.8*   Recent Labs    11/07/21 1223 11/08/21 0339  WBC 6.3 11.5*  RBC 4.94 4.87  HCT 36.3 36.8  PLT 140* 248   Recent Labs    11/07/21 1223 11/08/21 0339  NA 139 138  K 3.2* 3.9  CL 104 106  CO2 25 24  BUN 8 10  CREATININE 0.71 0.69  GLUCOSE 121* 143*  CALCIUM 8.6* 8.4*   No results for input(s): LABPT, INR in the last 72 hours.  Exam: General - Patient is Alert and Oriented Extremity - Neurologically intact Sensation intact distally Intact pulses distally Dorsiflexion/Plantar flexion intact Dressing - dressing C/D/I Motor Function - intact, moving foot and toes well on exam.   Past Medical History:  Diagnosis Date   Anxiety    Arthritis    Carpal tunnel syndrome    Chronic bronchitis (HCC)    Chronic kidney disease    COPD (chronic obstructive pulmonary disease) (HCC)    Fibromyalgia    GERD (gastroesophageal reflux disease)    Headache    Chronic migraines   IBS (irritable bowel syndrome)    IC (interstitial cystitis)     Neuromuscular disorder (HCC)    fibromyalgia   Pneumonia 2001   hx of x 2   Psoriatic arthritis (HCC)     Assessment/Plan: 1 Day Post-Op Procedure(s) (LRB): TOTAL KNEE ARTHROPLASTY (Right) Principal Problem:   S/P total knee arthroplasty, right  Estimated body mass index is 31.72 kg/m as calculated from the following:   Height as of this encounter: 5\' 6"  (1.676 m).   Weight as of this encounter: 89.1 kg. Advance diet Up with therapy D/C IV fluids   Patient's anticipated LOS is less than 2 midnights, meeting these requirements: - Younger than 27 - Lives within 1 hour of care - Has a competent adult at home to recover with post-op recover - NO history of  - Chronic pain requiring opiods  - Diabetes  - Coronary Artery Disease  - Heart failure  - Heart attack  - Stroke  - DVT/VTE  - Cardiac arrhythmia  - Respiratory Failure/COPD  - Renal failure  - Anemia  - Advanced Liver disease     DVT Prophylaxis - Aspirin Weight bearing as tolerated.  Plan is to go Home after hospital stay. Plan for discharge today following 1-2 sessions of PT as long as they are meeting their goals. Patient is scheduled for OPPT. Follow up in the office in 2 weeks.   Griffith Citron, PA-C Orthopedic Surgery [phone]  11/08/2021, 8:25 AM

## 2021-11-08 NOTE — Progress Notes (Signed)
Physical Therapy Treatment Patient Details Name: Christine Bradley MRN: [id number] DOB: January 01, 1966 Today's Date: 11/08/2021   History of Present Illness Christine Bradley is a 56 yo female s/p R TKA 11/07/21, H/O  R THA AA 08/29/21. PMH: CKD, COPD, fibromyalgia    PT Comments    POD # 1 am session Pt is AxO x 3 very pleasant.  Assisted out of recliner to amb in hallway then assisted to bathroom.  Pt self able to rise and lower as well as self peri care.  Assisted to recliner Then returned to room to perform some TE's following HEP handout.  Instructed on proper tech, freq as well as use of ICE.   Pt plans to go home later today after 4 pm.  Pt has met her mobility goals.  Addressed all mobility questions, discussed appropriate activity, educated on use of ICE.  Pt ready for D/C to home.   Recommendations for follow up therapy are one component of a multi-disciplinary discharge planning process, led by the attending physician.  Recommendations may be updated based on patient status, additional functional criteria and insurance authorization.  Follow Up Recommendations  Follow physician's recommendations for discharge plan and follow up therapies     Assistance Recommended at Discharge Intermittent Supervision/Assistance  Patient can return home with the following A little help with walking and/or transfers;Assist for transportation;Assistance with cooking/housework;A little help with bathing/dressing/bathroom;Help with stairs or ramp for entrance   Equipment Recommendations  BSC/3in1    Recommendations for Other Services       Precautions / Restrictions Precautions Precautions: Fall;Knee Restrictions Weight Bearing Restrictions: No     Mobility  Bed Mobility               General bed mobility comments: Pt already OOB in recliner    Transfers Overall transfer level: Needs assistance Equipment used: Rolling walker (2 wheels) Transfers: Sit to/from Stand Sit to Stand: Supervision, Min  guard           General transfer comment: 25% VC's on proper hand placement and increased time.  Also assisted with a toilet transfer.    Ambulation/Gait Ambulation/Gait assistance: Supervision, Min guard Gait Distance (Feet): 45 Feet Assistive device: Rolling walker (2 wheels) Gait Pattern/deviations: Step-to pattern, Antalgic Gait velocity: decreased     General Gait Details: increased time a functional distance   Stairs             Wheelchair Mobility    Modified Rankin (Stroke Patients Only)       Balance                                            Cognition Arousal/Alertness: Awake/alert Behavior During Therapy: WFL for tasks assessed/performed Overall Cognitive Status: Within Functional Limits for tasks assessed                                 General Comments: AxO x 3 very pleasant lady        Exercises  Total Knee Replacement TE's following HEP handout 10 reps B LE ankle pumps 05 reps towel squeezes 05 reps knee presses 05 reps heel slides  05 reps SAQ's 05 reps SLR's 05 reps ABD Educated on use of gait belt to assist with TE's Followed by ICE     General Comments  Pertinent Vitals/Pain Pain Assessment Pain Assessment: 0-10 Pain Score: 6  Pain Location: R knee with amb Pain Descriptors / Indicators: Discomfort, Grimacing Pain Intervention(s): Monitored during session, Premedicated before session, Repositioned, Ice applied    Home Living                          Prior Function            PT Goals (current goals can now be found in the care plan section) Progress towards PT goals: Progressing toward goals    Frequency    7X/week      PT Plan Current plan remains appropriate    Co-evaluation              AM-PAC PT "6 Clicks" Mobility   Outcome Measure  Help needed turning from your back to your side while in a flat bed without using bedrails?: A Little Help  needed moving from lying on your back to sitting on the side of a flat bed without using bedrails?: A Little Help needed moving to and from a bed to a chair (including a wheelchair)?: A Little Help needed standing up from a chair using your arms (e.g., wheelchair or bedside chair)?: A Little Help needed to walk in hospital room?: A Little Help needed climbing 3-5 steps with a railing? : A Little 6 Click Score: 18    End of Session Equipment Utilized During Treatment: Gait belt Activity Tolerance: Patient tolerated treatment well Patient left: in chair;with call bell/phone within reach;with chair alarm set Nurse Communication: Mobility status PT Visit Diagnosis: Unsteadiness on feet (R26.81);Difficulty in walking, not elsewhere classified (R26.2);Pain Pain - Right/Left: Right Pain - part of body: Knee     Time: 9806-0789 PT Time Calculation (min) (ACUTE ONLY): 34 min  Charges:  $Gait Training: 8-22 mins $Therapeutic Exercise: 8-22 mins                     Rica Koyanagi  PTA Acute  Rehabilitation Services Pager      [phone] Office      [phone]

## 2021-11-09 NOTE — Discharge Summary (Signed)
Patient ID: Christine Bradley MRN: [id number] DOB/AGE: July 22, 1966 56 y.o.  Admit date: 11/07/2021 Discharge date: 11/08/2021  Admission Diagnoses:  Right knee osteoarthritis  Discharge Diagnoses:  Principal Problem:   S/P total knee arthroplasty, right   Past Medical History:  Diagnosis Date   Anxiety    Arthritis    Carpal tunnel syndrome    Chronic bronchitis (HCC)    Chronic kidney disease    COPD (chronic obstructive pulmonary disease) (HCC)    Fibromyalgia    GERD (gastroesophageal reflux disease)    Headache    Chronic migraines   IBS (irritable bowel syndrome)    IC (interstitial cystitis)    Neuromuscular disorder (HCC)    fibromyalgia   Pneumonia 2001   hx of x 2   Psoriatic arthritis ([place])     Surgeries: Procedure(s): TOTAL KNEE ARTHROPLASTY on 11/07/2021   Consultants:   Discharged Condition: Improved  Hospital Course: IYA HAMED is an 56 y.o. female who was admitted 11/07/2021 for operative treatment ofS/P total knee arthroplasty, right. Patient has severe unremitting pain that affects sleep, daily activities, and work/hobbies. After pre-op clearance the patient was taken to the operating room on 11/07/2021 and underwent  Procedure(s): TOTAL KNEE ARTHROPLASTY.    Patient was given perioperative antibiotics:  Anti-infectives (From admission, onward)    Start     Dose/Rate Route Frequency Ordered Stop   11/07/21 1600  ceFAZolin (ANCEF) IVPB 2g/100 mL premix        2 g 200 mL/hr over 30 Minutes Intravenous Every 6 hours 11/07/21 1334 11/07/21 2208   11/07/21 0730  ceFAZolin (ANCEF) IVPB 2g/100 mL premix        2 g 200 mL/hr over 30 Minutes Intravenous On call to O.R. 11/07/21 0720 11/07/21 1022        Patient was given sequential compression devices, early ambulation, and chemoprophylaxis to prevent DVT. Patient worked with PT and was meeting their goals regarding safe ambulation and transfers.  Patient benefited maximally from hospital stay and there were  no complications.    Recent vital signs: No data found.   Recent laboratory studies:  Recent Labs    11/07/21 1223 11/08/21 0339  WBC 6.3 11.5*  HGB 11.0* 10.8*  HCT 36.3 36.8  PLT 140* 248  NA 139 138  K 3.2* 3.9  CL 104 106  CO2 25 24  BUN 8 10  CREATININE 0.71 0.69  GLUCOSE 121* 143*  CALCIUM 8.6* 8.4*     Discharge Medications:   Allergies as of 11/08/2021       Reactions   Albuterol Other (See Comments)   Inhaler only (nebulizer is fine) - reaction: worsened asthma and bronchitis   Hydrocodone-acetaminophen Nausea And Vomiting   Maxalt [rizatriptan]    Worsened migraines    Penicillins Other (See Comments)   "raw on my bottom" Has patient had a PCN reaction causing immediate rash, facial/tongue/throat swelling, SOB or lightheadedness with hypotension: No Has patient had a PCN reaction causing severe rash involving mucus membranes or skin necrosis: No Has patient had a PCN reaction that required hospitalization No Has patient had a PCN reaction occurring within the last 10 years: No If all of the above answers are "NO", then may proceed with Cephalosporin use. Tolerated ancef 08/29/21   Voltaren [diclofenac Sodium] Other (See Comments)   Stomach ulcers - in pill form, pt is currently using the topical form        Medication List     STOP taking  these medications    Pfizer COVID-19 Vac Bivalent injection Generic drug: COVID-19 mRNA bivalent vaccine Therapist, music)   Pfizer-BioNT COVID-19 Vac-TriS Susp injection Generic drug: COVID-19 mRNA Vac-TriS Therapist, music)       TAKE these medications    acetaminophen 325 MG tablet Commonly known as: TYLENOL Take 3 tablets (975 mg total) by mouth every 6 (six) hours as needed.   albuterol (2.5 MG/3ML) 0.083% nebulizer solution Commonly known as: PROVENTIL Inhale 3 mLs into the lungs every 6 (six) hours as needed for shortness of breath or wheezing.   aspirin 81 MG chewable tablet Chew 1 tablet (81 mg total) by mouth 2  (two) times daily for 28 days.   atorvastatin 10 MG tablet Commonly known as: LIPITOR Take 10 mg by mouth in the morning.   cetirizine 10 MG tablet Commonly known as: ZYRTEC Take 10 mg by mouth in the morning.   Cosentyx 150 MG/ML Sosy Generic drug: Secukinumab Inject 150 mg into the skin every 30 (thirty) days.   cyclobenzaprine 10 MG tablet Commonly known as: FLEXERIL Take 20 mg by mouth at bedtime.   cyclobenzaprine 5 MG tablet Commonly known as: FLEXERIL Take 1 tablet (5 mg total) by mouth 3 (three) times daily as needed for muscle spasms. Take 1 tablet by mouth morning and afternoon, and continue your normal dose of 20 mg nightly as needed   diclofenac Sodium 1 % Gel Commonly known as: Voltaren Apply 2 g topically 4 (four) times daily.   docusate sodium 100 MG capsule Commonly known as: COLACE Take 1 capsule (100 mg total) by mouth 2 (two) times daily.   famotidine 40 MG tablet Commonly known as: PEPCID Take 40 mg by mouth at bedtime.   ferrous sulfate 325 (65 FE) MG tablet Take 325 mg by mouth in the morning.   fluticasone 50 MCG/ACT nasal spray Commonly known as: FLONASE Place 2 sprays into both nostrils daily.   Fluticasone-Salmeterol 250-50 MCG/DOSE Aepb Commonly known as: ADVAIR Inhale 1 puff into the lungs 2 (two) times daily.   folic acid 1 MG tablet Commonly known as: FOLVITE Take 1 mg by mouth in the morning.   HAIR/SKIN/NAILS PO Take 2 tablets by mouth every morning.   ipratropium 17 MCG/ACT inhaler Commonly known as: ATROVENT HFA Inhale 2 puffs into the lungs every 6 (six) hours as needed for wheezing.   linaclotide 72 MCG capsule Commonly known as: LINZESS Take 72 mcg by mouth daily before breakfast.   methotrexate 2.5 MG tablet Commonly known as: RHEUMATREX Take 20 mg by mouth every Friday. Caution:Chemotherapy. Protect from light.   montelukast 10 MG tablet Commonly known as: SINGULAIR Take 10 mg by mouth in the morning.    MULTIVITAMIN GUMMIES ADULT PO Take 2 tablets by mouth every morning.   nortriptyline 10 MG capsule Commonly known as: PAMELOR Take 20 mg by mouth at bedtime.   oxybutynin 5 MG 24 hr tablet Commonly known as: DITROPAN-XL Take 5 mg by mouth at bedtime.   oxyCODONE 5 MG immediate release tablet Commonly known as: Oxy IR/ROXICODONE Take 1-2 tablets (5-10 mg total) by mouth every 4 (four) hours as needed for severe pain. What changed: when to take this   pantoprazole 40 MG tablet Commonly known as: PROTONIX Take 40 mg by mouth daily before breakfast.   Polyethyl Glycol-Propyl Glycol 0.4-0.3 % Soln Place 1 drop into both eyes in the morning.   polyethylene glycol 17 g packet Commonly known as: MIRALAX / GLYCOLAX Take 17 g by mouth  daily as needed for mild constipation.   pregabalin 150 MG capsule Commonly known as: LYRICA Take 150 mg by mouth at bedtime.   promethazine-dextromethorphan 6.25-15 MG/5ML syrup Commonly known as: PROMETHAZINE-DM Take 2.5 mLs by mouth 4 (four) times daily as needed for cough. What changed:  how much to take when to take this   topiramate 100 MG tablet Commonly known as: TOPAMAX Take 200 mg by mouth at bedtime.   venlafaxine XR 75 MG 24 hr capsule Commonly known as: EFFEXOR-XR Take 75 mg by mouth daily.   vitamin C 250 MG tablet Commonly known as: ASCORBIC ACID Take 500 mg by mouth daily.   Vitamin D 50 MCG (2000 UT) tablet Take 4,000 Units by mouth daily.               Discharge Care Instructions  (From admission, onward)           Start     Ordered   11/08/21 0000  Change dressing       Comments: Maintain surgical dressing until follow up in the clinic. If the edges start to pull up, may reinforce with tape. If the dressing is no longer working, may remove and cover with gauze and tape, but must keep the area dry and clean.  Call with any questions or concerns.   11/08/21 0830            Diagnostic Studies: No  results found.  Disposition: Discharge disposition: 01-Home or Self Care       Discharge Instructions     Call MD / Call 911   Complete by: As directed    If you experience chest pain or shortness of breath, CALL 911 and be transported to the hospital emergency room.  If you develope a fever above 101 F, pus (white drainage) or increased drainage or redness at the wound, or calf pain, call your surgeon's office.   Change dressing   Complete by: As directed    Maintain surgical dressing until follow up in the clinic. If the edges start to pull up, may reinforce with tape. If the dressing is no longer working, may remove and cover with gauze and tape, but must keep the area dry and clean.  Call with any questions or concerns.   Constipation Prevention   Complete by: As directed    Drink plenty of fluids.  Prune juice may be helpful.  You may use a stool softener, such as Colace (over the counter) 100 mg twice a day.  Use MiraLax (over the counter) for constipation as needed.   Diet - low sodium heart healthy   Complete by: As directed    Increase activity slowly as tolerated   Complete by: As directed    Weight bearing as tolerated with assist device (walker, cane, etc) as directed, use it as long as suggested by your surgeon or therapist, typically at least 4-6 weeks.   Post-operative opioid taper instructions:   Complete by: As directed    POST-OPERATIVE OPIOID TAPER INSTRUCTIONS: It is important to wean off of your opioid medication as soon as possible. If you do not need pain medication after your surgery it is ok to stop day one. Opioids include: Codeine, Hydrocodone(Norco, Vicodin), Oxycodone(Percocet, oxycontin) and hydromorphone amongst others.  Long term and even short term use of opiods can cause: Increased pain response Dependence Constipation Depression Respiratory depression And more.  Withdrawal symptoms can include Flu like symptoms Nausea, vomiting And  more Techniques to manage  these symptoms Hydrate well Eat regular healthy meals Stay active Use relaxation techniques(deep breathing, meditating, yoga) Do Not substitute Alcohol to help with tapering If you have been on opioids for less than two weeks and do not have pain than it is ok to stop all together.  Plan to wean off of opioids This plan should start within one week post op of your joint replacement. Maintain the same interval or time between taking each dose and first decrease the dose.  Cut the total daily intake of opioids by one tablet each day Next start to increase the time between doses. The last dose that should be eliminated is the evening dose.      TED hose   Complete by: As directed    Use stockings (TED hose) for 2 weeks on both leg(s).  You may remove them at night for sleeping.        Follow-up Information     Paralee Cancel, MD. Schedule an appointment as soon as possible for a visit in 2 week(s).   Specialty: Orthopedic Surgery Contact information: [address] [place] [address] [phone]                  Signed: Irving Copas 11/09/2021, 4:04 PM

## 2021-11-17 ENCOUNTER — Encounter (HOSPITAL_COMMUNITY): Payer: Self-pay

## 2021-11-17 ENCOUNTER — Other Ambulatory Visit: Payer: Self-pay

## 2021-11-17 ENCOUNTER — Ambulatory Visit (HOSPITAL_COMMUNITY)
Admission: RE | Admit: 2021-11-17 | Discharge: 2021-11-17 | Disposition: A | Discharge status: Home / Self Care | Payer: Medicare Other | Source: Ambulatory Visit | Attending: Cardiology | Admitting: Cardiology

## 2021-11-17 ENCOUNTER — Other Ambulatory Visit (HOSPITAL_COMMUNITY): Payer: Self-pay | Admitting: Student

## 2021-11-17 DIAGNOSIS — M79662 Pain in left lower leg: Secondary | ICD-10-CM | POA: Diagnosis present

## 2021-11-17 DIAGNOSIS — M79605 Pain in left leg: Secondary | ICD-10-CM | POA: Insufficient documentation

## 2021-11-17 DIAGNOSIS — M79604 Pain in right leg: Secondary | ICD-10-CM

## 2021-11-17 DIAGNOSIS — M79661 Pain in right lower leg: Secondary | ICD-10-CM | POA: Diagnosis present

## 2021-11-17 NOTE — Progress Notes (Signed)
Bilateral lower extremity venous duplex test completed. Evidence of acute DVT in the right lower extremity. See results under Chart Review-CV proc. Spoke with Doroteo Bradford and Dr. Alvan Dame at [facility], patient given instructions to pick up medication at Pharmacy and follow up with her PCP. Final report to follow.

## 2021-11-21 DIAGNOSIS — Z86718 Personal history of other venous thrombosis and embolism: Secondary | ICD-10-CM | POA: Insufficient documentation

## 2021-11-23 ENCOUNTER — Telehealth: Payer: Self-pay | Admitting: Hematology and Oncology

## 2021-11-23 NOTE — Telephone Encounter (Signed)
Scheduled appt per 3/1 referral. Pt is aware of appt date and time. Pt is aware to arrive 15 mins prior to appt time and to bring and updated insurance card. Pt is aware of appt location.   ?

## 2021-12-05 NOTE — Patient Instructions (Signed)
DUE TO COVID-19 ONLY ONE VISITOR  (aged 57 and older)  IS ALLOWED TO COME WITH YOU AND STAY IN THE WAITING ROOM ONLY DURING PRE OP AND PROCEDURE.   ?**NO VISITORS ARE ALLOWED IN THE SHORT STAY AREA OR RECOVERY ROOM!!** ? ?IF YOU WILL BE ADMITTED INTO THE HOSPITAL YOU ARE ALLOWED ONLY TWO SUPPORT PEOPLE DURING VISITATION HOURS ONLY (7 AM -8PM)   ?The support person(s) must pass our screening, gel in and out, and wear a mask at all times, including in the patient?s room. ?Patients must also wear a mask when staff or their support person are in the room. ?Visitors GUEST BADGE MUST BE WORN VISIBLY  ?One adult visitor may remain with you overnight and MUST be in the room by 8 P.M. ?  ? Your procedure is scheduled on: 12/19/21 ? ? Report to [facility] Main Entrance ? ?  Report to admitting at : 7:15 AM ? ? Call this number if you have problems the morning of surgery [phone] ? ? Do not eat food :After Midnight. ? ? After Midnight you may have the following liquids until : 7:00 AM DAY OF SURGERY ? ?Water ?Black Coffee (sugar ok, NO MILK/CREAM OR CREAMERS)  ?Tea (sugar ok, NO MILK/CREAM OR CREAMERS) regular and decaf                             ?Plain Jell-O (NO RED)                                           ?Fruit ices (not with fruit pulp, NO RED)                                     ?Popsicles (NO RED)                                                                  ?Juice: apple, WHITE grape, WHITE cranberry ?Sports drinks like Gatorade (NO RED) ?Clear broth(vegetable,chicken,beef) ? ?             ?Drink Ensure drink AT : 7:00 AM the day of surgery.   ?  ?The day of surgery:  ?Drink ONE (1) Pre-Surgery Clear Ensure or G2 at AM the morning of surgery. Drink in one sitting. Do not sip.  ?This drink was given to you during your hospital  ?pre-op appointment visit. ?Nothing else to drink after completing the  ?Pre-Surgery Clear Ensure or G2. ?  ?       If you have questions, please contact your surgeon?s  office. ?  ?Oral Hygiene is also important to reduce your risk of infection.                                    ?Remember - BRUSH YOUR TEETH THE MORNING OF SURGERY WITH YOUR REGULAR TOOTHPASTE ? ? Do NOT smoke after Midnight ? ? Take these medicines the morning of surgery with  A SIP OF WATER: montelukast,cetirizine,linaclotide,pantoprazole.Use inhalers as usual. ? ?DO NOT TAKE ANY ORAL DIABETIC MEDICATIONS DAY OF YOUR SURGERY ? ?Bring CPAP mask and tubing day of surgery. ?                  ?           You may not have any metal on your body including hair pins, jewelry, and body piercing ? ?           Do not wear make-up, lotions, powders, perfumes/cologne, or deodorant ? ?Do not wear nail polish including gel and S&S, artificial/acrylic nails, or any other type of covering on natural nails including finger and toenails. If you have artificial nails, gel coating, etc. that needs to be removed by a nail salon please have this removed prior to surgery or surgery may need to be canceled/ delayed if the surgeon/ anesthesia feels like they are unable to be safely monitored.  ? ?Do not shave  48 hours prior to surgery.  ? ? Do not bring valuables to the hospital. [place] NOT ?            RESPONSIBLE   FOR VALUABLES. ? ? Contacts, dentures or bridgework may not be worn into surgery. ? ? Bring small overnight bag day of surgery. ?  ? Patients discharged on the day of surgery will not be allowed to drive home.  Someone NEEDS to stay with you for the first 24 hours after anesthesia. ? ? Special Instructions: Bring a copy of your healthcare power of attorney and living will documents         the day of surgery if you haven't scanned them before. ? ?            Please read over the following fact sheets you were given: [facility] [place] [phone] ? ?   [place] - Preparing for Surgery ?Before surgery, you can play an important role.  Because skin is not sterile, your  skin needs to be as free of germs as possible.  You can reduce the number of germs on your skin by washing with CHG (chlorahexidine gluconate) soap before surgery.  CHG is an antiseptic cleaner which kills germs and bonds with the skin to continue killing germs even after washing. ?Please DO NOT use if you have an allergy to CHG or antibacterial soaps.  If your skin becomes reddened/irritated stop using the CHG and inform your nurse when you arrive at Short Stay. ?Do not shave (including legs and underarms) for at least 48 hours prior to the first CHG shower.  You may shave your face/neck. ?Please follow these instructions carefully: ? 1.  Shower with CHG Soap the night before surgery and the  morning of Surgery. ? 2.  If you choose to wash your hair, wash your hair first as usual with your  normal  shampoo. ? 3.  After you shampoo, rinse your hair and body thoroughly to remove the  shampoo.                           4.  Use CHG as you would any other liquid soap.  You can apply chg directly  to the skin and wash  ?                     Gently with a scrungie or  clean washcloth. ? 5.  Apply the CHG Soap to your body ONLY FROM THE NECK DOWN.   Do not use on face/ open      ?                     Wound or open sores. Avoid contact with eyes, ears mouth and genitals (private parts).  ?                     Production manager,  Genitals (private parts) with your normal soap. ?            6.  Wash thoroughly, paying special attention to the area where your surgery  will be performed. ? 7.  Thoroughly rinse your body with warm water from the neck down. ? 8.  DO NOT shower/wash with your normal soap after using and rinsing off  the CHG Soap. ?               9.  Pat yourself dry with a clean towel. ?           10.  Wear clean pajamas. ?           11.  Place clean sheets on your bed the night of your first shower and do not  sleep with pets. ?Day of Surgery : ?Do not apply any lotions/deodorants the morning of surgery.  Please wear  clean clothes to the hospital/surgery center. ? ?FAILURE TO FOLLOW THESE INSTRUCTIONS MAY RESULT IN THE CANCELLATION OF YOUR SURGERY ?PATIENT SIGNATURE_________________________________ ? ?NURSE SIGNATURE__________________________________ ? ?________________________________________________________________________  ?

## 2021-12-06 ENCOUNTER — Encounter (HOSPITAL_COMMUNITY): Payer: Medicare Other

## 2021-12-07 ENCOUNTER — Other Ambulatory Visit (HOSPITAL_COMMUNITY): Payer: Medicare Other

## 2021-12-08 ENCOUNTER — Telehealth: Payer: Self-pay

## 2021-12-08 ENCOUNTER — Other Ambulatory Visit: Payer: Self-pay

## 2021-12-08 ENCOUNTER — Encounter: Payer: Self-pay | Admitting: Hematology and Oncology

## 2021-12-08 ENCOUNTER — Encounter (HOSPITAL_COMMUNITY): Payer: Self-pay | Admitting: Physician Assistant

## 2021-12-08 ENCOUNTER — Inpatient Hospital Stay: Payer: Medicare Other | Attending: Hematology and Oncology | Admitting: Hematology and Oncology

## 2021-12-08 DIAGNOSIS — I824Z1 Acute embolism and thrombosis of unspecified deep veins of right distal lower extremity: Secondary | ICD-10-CM | POA: Insufficient documentation

## 2021-12-08 DIAGNOSIS — Z7901 Long term (current) use of anticoagulants: Secondary | ICD-10-CM | POA: Insufficient documentation

## 2021-12-08 NOTE — Telephone Encounter (Signed)
-----   Message from Heath Lark, MD sent at 12/08/2021  2:05 PM EDT ----- ?Regarding: non urgent ?1) please call Dr. Aurea Graff office. We need to wait 3 months before she can have surgery due to recent blood clot. I will see her end of May, so earliest for surgery would be first week of June ?2) please help arrange US venous doppler to be done on 5/25 ? ?Thanks ? ?

## 2021-12-08 NOTE — Assessment & Plan Note (Signed)
This last episode of blood clot appeared to be provoked by knee surgery.  I could not find any documentation that she was placed on postoperative DVT prophylaxis ?She needs to complete at least minimum 3 months of anticoagulation therapy before contemplating further surgery in the future ?She is doing well on current dose of Xarelto without bleeding complications ?I will bring her back in 3 months to have repeat blood work, D-dimer and ultrasound venous Doppler to document resolution ?She desire surgery to the left knee and right shoulder ?She would need aggressive DVT prophylaxis in the future after surgery ?We will contact her surgeon to inform the need for to change the dates of her procedure ?I plan to see her back on May 30 ?She should be able to pursue surgery as early as first week of June ? ?

## 2021-12-08 NOTE — Telephone Encounter (Signed)
Called and given below message to office staff at Dr. Alvan Dame office. They will give message to Dr. Alvan Dame. ? ?Christine Bradley and given appt for doppler on 5/25 at 2 pm at [facility]. Reviewed upcoming appts. She verbalized understanding. ?

## 2021-12-08 NOTE — Progress Notes (Signed)
[place] ?CONSULT NOTE ? ?Patient Care Team: ?Christine Mires, MD as PCP - General (Family Medicine) ? ?Assessment and plan ?Acute deep vein thrombosis (DVT) of distal vein of right lower extremity ([place]) ?This last episode of blood clot appeared to be provoked by knee surgery.  I could not find any documentation that she was placed on postoperative DVT prophylaxis ?She needs to complete at least minimum 3 months of anticoagulation therapy before contemplating further surgery in the future ?She is doing well on current dose of Xarelto without bleeding complications ?I will bring her back in 3 months to have repeat blood work, D-dimer and ultrasound venous Doppler to document resolution ?She desire surgery to the left knee and right shoulder ?She would need aggressive DVT prophylaxis in the future after surgery ?We will contact her surgeon to inform the need for to change the dates of her procedure ?I plan to see her back on May 30 ?She should be able to pursue surgery as early as first week of June ? ?Orders Placed This Encounter  ?Procedures  ? CBC with Differential ([facility] Only)  ?  Standing Status:   Future  ?  Standing Expiration Date:   12/09/2022  ? [facility] Only  ?  Standing Status:   Future  ?  Standing Expiration Date:   12/09/2022  ? D-dimer, quantitative  ?  Standing Status:   Future  ?  Standing Expiration Date:   12/09/2022  ? ? ?All questions were answered. The patient knows to call the clinic with any problems, questions or concerns. ?The total time spent in the appointment was 60 minutes encounter with patients including review of chart and various tests results, discussions about plan of care and coordination of care plan ? ?Heath Lark, MD ?3/17/20231:40 PM ? ? ? ? ?CHIEF COMPLAINTS/PURPOSE OF CONSULTATION:  ?Acute right lower extremity DVT ? ?HISTORY OF PRESENTING ILLNESS:  ?Christine Bradley 56 y.o. female is here because of recent diagnosis of acute right  lower extremity DVT ?The patient had chronic degenerative joint disease and underwent right knee surgery on November 07, 2021 ?10 days after surgery, she developed right lower extremity pain and swelling ?Venous Doppler ultrasound showed: ?RIGHT:  ?- Findings consistent with acute deep vein thrombosis involving the right common femoral vein, and right femoral vein.  ?- No cystic structure found in the popliteal fossa.  ?- Common femoral vein obstruction doesn't appear to extend above inguinal ligament.  ?   ?LEFT:  ?- No evidence of deep vein thrombosis in the lower extremity. No indirect evidence of obstruction proximal to the inguinal ligament.  ?- No cystic structure found in the popliteal fossa.  ?   ?She was placed on Xarelto and is doing well ?She is compliant taking anticoagulation therapy ?She has no bleeding complications ?She denies further right lower extremity pain or swelling ?She is using elastic compression hose on a regular basis ?She had numerous surgeries in the past including right shoulder surgery, hysterectomy and others ? ?She has also taken birth control pill for close to 15 years without complications ? ?She denies recent chest pain on exertion, shortness of breath on minimal exertion, pre-syncopal episodes, hemoptysis, or palpitation. ? ?She had no prior history or diagnosis of cancer. Her age appropriate screening programs are up-to-date. ?She had prior surgeries before and never had perioperative thromboembolic events. ? ?The patient had been pregnant before and denies history of peripartum thromboembolic event; she has  2 early trimester miscarriages. ?There is no family history of blood clots or miscarriages. ? ?MEDICAL HISTORY:  ?Past Medical History:  ?Diagnosis Date  ? Anxiety   ? Arthritis   ? Carpal tunnel syndrome   ? Chronic bronchitis ([place])   ? Chronic kidney disease   ? COPD (chronic obstructive pulmonary disease) ([place])   ? Fibromyalgia   ? GERD (gastroesophageal reflux disease)    ? Headache   ? Chronic migraines  ? IBS (irritable bowel syndrome)   ? IC (interstitial cystitis)   ? Neuromuscular disorder ([place])   ? fibromyalgia  ? Pneumonia 2001  ? hx of x 2  ? Psoriatic arthritis ([place])   ? ? ?SURGICAL HISTORY: ?Past Surgical History:  ?Procedure Laterality Date  ? ABDOMINAL HYSTERECTOMY  2004  ? BREAST EXCISIONAL BIOPSY Left 12/31/2017  ? foot surgery  1997  ? RADIOACTIVE SEED GUIDED EXCISIONAL BREAST BIOPSY Left 12/31/2017  ? Procedure: LEFT BREAST SEED GUIDED EXCISIONAL BIOPSY;  Surgeon: Coralie Keens, MD;  Location: [facility];  Service: General;  Laterality: Left;  ? right shoulder repair Right   ? TOTAL HIP ARTHROPLASTY Right 08/29/2021  ? Procedure: TOTAL HIP ARTHROPLASTY ANTERIOR APPROACH;  Surgeon: Paralee Cancel, MD;  Location: [facility];  Service: Orthopedics;  Laterality: Right;  ? TOTAL KNEE ARTHROPLASTY Right 11/07/2021  ? Procedure: TOTAL KNEE ARTHROPLASTY;  Surgeon: Paralee Cancel, MD;  Location: [facility];  Service: Orthopedics;  Laterality: Right;  ? ? ?SOCIAL HISTORY: ?Social History  ? ?Socioeconomic History  ? Marital status: Single  ?  Spouse name: Not on file  ? Number of children: 3  ? Years of education: Not on file  ? Highest education level: Not on file  ?Occupational History  ? Occupation: disability  ?Tobacco Use  ? Smoking status: Never  ? Smokeless tobacco: Never  ?Vaping Use  ? Vaping Use: Never used  ?Substance and Sexual Activity  ? Alcohol use: Never  ? Drug use: Never  ? Sexual activity: Not on file  ?Other Topics Concern  ? Not on file  ?Social History Narrative  ? Not on file  ? ?Social Determinants of Health  ? ?Financial Resource Strain: Not on file  ?Food Insecurity: Not on file  ?Transportation Needs: Not on file  ?Physical Activity: Not on file  ?Stress: Not on file  ?Social Connections: Not on file  ?Intimate Partner Violence: Not on file  ? ? ?FAMILY HISTORY: ?Family History  ?Problem Relation Age of Onset  ? Arthritis Mother   ? Breast cancer Mother 24   ? ? ?ALLERGIES:  is allergic to albuterol, hydrocodone-acetaminophen, maxalt [rizatriptan], penicillins, and voltaren [diclofenac sodium]. ? ?MEDICATIONS:  ?Current Outpatient Medications  ?Medication Sig Dispense Refill  ? rivaroxaban (XARELTO) 20 MG TABS tablet Take 20 mg by mouth daily.    ? acetaminophen (TYLENOL) 325 MG tablet Take 3 tablets (975 mg total) by mouth every 6 (six) hours as needed. (Patient not taking: Reported on 10/19/2021)    ? atorvastatin (LIPITOR) 10 MG tablet Take 10 mg by mouth in the morning.    ? cetirizine (ZYRTEC) 10 MG tablet Take 10 mg by mouth in the morning.    ? Cholecalciferol (VITAMIN D) 2000 units tablet Take 4,000 Units by mouth daily.    ? cyclobenzaprine (FLEXERIL) 10 MG tablet Take 20 mg by mouth at bedtime.    ? diclofenac Sodium (VOLTAREN) 1 % GEL Apply 2 g topically 4 (four) times daily. 100 g 0  ? famotidine (PEPCID) 40  MG tablet Take 40 mg by mouth at bedtime.    ? ferrous sulfate 325 (65 FE) MG tablet Take 325 mg by mouth in the morning.    ? fluticasone (FLONASE) 50 MCG/ACT nasal spray Place 2 sprays into both nostrils daily.    ? Fluticasone-Salmeterol (ADVAIR) 250-50 MCG/DOSE AEPB Inhale 1 puff into the lungs 2 (two) times daily.    ? folic acid (FOLVITE) 1 MG tablet Take 1 mg by mouth in the morning.    ? ipratropium (ATROVENT HFA) 17 MCG/ACT inhaler Inhale 2 puffs into the lungs every 6 (six) hours as needed for wheezing.     ? linaclotide (LINZESS) 72 MCG capsule Take 72 mcg by mouth daily before breakfast.    ? methotrexate (RHEUMATREX) 2.5 MG tablet Take 20 mg by mouth every Friday. Caution:Chemotherapy. Protect from light.    ? montelukast (SINGULAIR) 10 MG tablet Take 10 mg by mouth in the morning.    ? Multiple Vitamins-Minerals (HAIR/SKIN/NAILS PO) Take 2 tablets by mouth every morning.     ? Multiple Vitamins-Minerals (MULTIVITAMIN GUMMIES ADULT PO) Take 2 tablets by mouth every morning.    ? nortriptyline (PAMELOR) 10 MG capsule Take 20 mg by mouth at  bedtime.    ? oxybutynin (DITROPAN-XL) 5 MG 24 hr tablet Take 5 mg by mouth at bedtime.    ? pantoprazole (PROTONIX) 40 MG tablet Take 40 mg by mouth daily before breakfast.     ? Polyethyl Glycol-Propyl

## 2021-12-19 ENCOUNTER — Encounter (HOSPITAL_COMMUNITY): Admission: RE | Payer: Self-pay | Source: Home / Self Care

## 2021-12-19 ENCOUNTER — Ambulatory Visit (HOSPITAL_COMMUNITY): Admission: RE | Admit: 2021-12-19 | Payer: Medicare Other | Source: Home / Self Care | Admitting: Orthopedic Surgery

## 2021-12-19 DIAGNOSIS — M1712 Unilateral primary osteoarthritis, left knee: Secondary | ICD-10-CM

## 2021-12-19 DIAGNOSIS — Z01818 Encounter for other preprocedural examination: Secondary | ICD-10-CM

## 2021-12-19 SURGERY — ARTHROPLASTY, KNEE, TOTAL
Anesthesia: Spinal | Site: Knee | Laterality: Left

## 2022-01-09 ENCOUNTER — Telehealth: Payer: Self-pay

## 2022-01-09 NOTE — Telephone Encounter (Signed)
Returned her call and ask her to call the office back. ?

## 2022-01-09 NOTE — Telephone Encounter (Signed)
She returned my call. She is requesting a different blood thinner. The Xarelto is causing side effects. She is complaining of muscle pain from the Rx. She suffers with arthritis and fibromyalgia. She is c/o muscle pain and staying in the bed a lot. ?She is asking for different blood thinner and uses [facility] on [address]. ?

## 2022-01-09 NOTE — Telephone Encounter (Signed)
She needs to be seen (have not seen her for a while) or she can ask her PCP to write her another medication ?

## 2022-01-09 NOTE — Telephone Encounter (Signed)
Returned her call. No answer, ask her to call the office back. ?

## 2022-01-09 NOTE — Telephone Encounter (Signed)
Called back and given below message. She verbalized understanding and appt scheduled 4/24 at 0820 with Dr. Alvy Bimler. She is aware of appt. ?

## 2022-01-15 ENCOUNTER — Encounter: Payer: Self-pay | Admitting: Hematology and Oncology

## 2022-01-15 ENCOUNTER — Other Ambulatory Visit: Payer: Self-pay

## 2022-01-15 ENCOUNTER — Inpatient Hospital Stay: Payer: Medicare Other | Attending: Hematology and Oncology | Admitting: Hematology and Oncology

## 2022-01-15 VITALS — BP 131/82 | HR 90 | Temp 97.4°F | Resp 18 | Ht 66.0 in | Wt 196.8 lb

## 2022-01-15 DIAGNOSIS — M797 Fibromyalgia: Secondary | ICD-10-CM | POA: Insufficient documentation

## 2022-01-15 DIAGNOSIS — I824Z1 Acute embolism and thrombosis of unspecified deep veins of right distal lower extremity: Secondary | ICD-10-CM | POA: Diagnosis not present

## 2022-01-15 DIAGNOSIS — I82411 Acute embolism and thrombosis of right femoral vein: Secondary | ICD-10-CM | POA: Diagnosis present

## 2022-01-15 DIAGNOSIS — Z7901 Long term (current) use of anticoagulants: Secondary | ICD-10-CM | POA: Diagnosis not present

## 2022-01-15 DIAGNOSIS — Z79899 Other long term (current) drug therapy: Secondary | ICD-10-CM | POA: Diagnosis not present

## 2022-01-15 MED ORDER — APIXABAN 5 MG PO TABS: 5.0000 mg | ORAL_TABLET | Freq: Two times a day (BID) | ORAL | 3 refills | Status: DC

## 2022-01-15 NOTE — Progress Notes (Signed)
? ?[place] ?OFFICE PROGRESS NOTE ? ?Christine Mires, MD ? ?ASSESSMENT & PLAN:  ?Acute deep vein thrombosis (DVT) of distal vein of right lower extremity ([place]) ?She denies bleeding complications from Xarelto ?She could not tolerate Xarelto because she felt it exacerbated her fibromyalgia ?She read the package insert and back pain was listed as a side effect of Xarelto ?I tried to explain to the patient mechanism of action of Xarelto ?Does not seem to have a direct correlation/effect to cause exacerbation of fibromyalgia ?We discussed other options including using Lovenox or warfarin ?There is no guarantee that switching her from one brand of NOAC to another would reduce her likelihood of having exacerbation of fibromyalgia ?Ultimately, the patient has made informed decision to switch to Eliquis ?I will send prescription to her pharmacy; I told the patient there is a likelihood that her insurance may not pay for Eliquis if this is not a preferred brand but we will try to help her with filing for appeal if needed ?As soon as we get verification that Eliquis is filled, we will remove Xarelto off her medication list ? ?No orders of the defined types were placed in this encounter. ? ? ?The total time spent in the appointment was 25 minutes encounter with patients including review of chart and various tests results, discussions about plan of care and coordination of care plan ? ? All questions were answered. The patient knows to call the clinic with any problems, questions or concerns. No barriers to learning was detected. ? ? ? Heath Lark, MD ?4/24/20238:34 AM ? ?INTERVAL HISTORY: ?Christine Bradley 56 y.o. female returns for urgent evaluation ?She was placed on Xarelto for DVT ?She felt that her fibromyalgia is exacerbated while on Xarelto despite changing the timing when she takes Xarelto ?She is already maxed out on all prescription that her doctor prescribed for fibromyalgia ?When she read the package  insert, back pain was listed as a potential side effects of Xarelto ?She does not want to continue on Xarelto and is asking to be switched to another form of anticoagulation therapy ? ?SUMMARY OF HEMATOLOGIC HISTORY: ?[name] 56 y.o. female is here because of recent diagnosis of acute right lower extremity DVT ?The patient had chronic degenerative joint disease and underwent right knee surgery on November 07, 2021 ?10 days after surgery, she developed right lower extremity pain and swelling ?Venous Doppler ultrasound showed: ?RIGHT:  ?- Findings consistent with acute deep vein thrombosis involving the right common femoral vein, and right femoral vein.  ?- No cystic structure found in the popliteal fossa.  ?- Common femoral vein obstruction doesn't appear to extend above inguinal ligament.  ?   ?LEFT:  ?- No evidence of deep vein thrombosis in the lower extremity. No indirect evidence of obstruction proximal to the inguinal ligament.  ?- No cystic structure found in the popliteal fossa.  ?   ?She was placed on Xarelto and is doing well ?She is compliant taking anticoagulation therapy ?She has no bleeding complications ?She denies further right lower extremity pain or swelling ?She is using elastic compression hose on a regular basis ?She had numerous surgeries in the past including right shoulder surgery, hysterectomy and others ? ?She has also taken birth control pill for close to 15 years without complications ? ?She denies recent chest pain on exertion, shortness of breath on minimal exertion, pre-syncopal episodes, hemoptysis, or palpitation. ? ?She had no prior history or diagnosis of cancer. Her age  appropriate screening programs are up-to-date. ?She had prior surgeries before and never had perioperative thromboembolic events. ? ?The patient had been pregnant before and denies history of peripartum thromboembolic event; she has 2 early trimester miscarriages. ?There is no family history of blood clots or  miscarriages. ? ? ?I have reviewed the past medical history, past surgical history, social history and family history with the patient and they are unchanged from previous note. ? ?ALLERGIES:  is allergic to albuterol, hydrocodone-acetaminophen, maxalt [rizatriptan], penicillins, and voltaren [diclofenac sodium]. ? ?MEDICATIONS:  ?Current Outpatient Medications  ?Medication Sig Dispense Refill  ? apixaban (ELIQUIS) 5 MG TABS tablet Take 1 tablet (5 mg total) by mouth 2 (two) times daily. 60 tablet 3  ? atorvastatin (LIPITOR) 10 MG tablet Take 10 mg by mouth in the morning.    ? cetirizine (ZYRTEC) 10 MG tablet Take 10 mg by mouth in the morning.    ? Cholecalciferol (VITAMIN D) 2000 units tablet Take 4,000 Units by mouth daily.    ? cyclobenzaprine (FLEXERIL) 10 MG tablet Take 20 mg by mouth at bedtime.    ? diclofenac Sodium (VOLTAREN) 1 % GEL Apply 2 g topically 4 (four) times daily. 100 g 0  ? famotidine (PEPCID) 40 MG tablet Take 40 mg by mouth at bedtime.    ? ferrous sulfate 325 (65 FE) MG tablet Take 325 mg by mouth in the morning.    ? fluticasone (FLONASE) 50 MCG/ACT nasal spray Place 2 sprays into both nostrils daily.    ? Fluticasone-Salmeterol (ADVAIR) 250-50 MCG/DOSE AEPB Inhale 1 puff into the lungs 2 (two) times daily.    ? folic acid (FOLVITE) 1 MG tablet Take 1 mg by mouth in the morning.    ? ipratropium (ATROVENT HFA) 17 MCG/ACT inhaler Inhale 2 puffs into the lungs every 6 (six) hours as needed for wheezing.     ? linaclotide (LINZESS) 72 MCG capsule Take 72 mcg by mouth daily before breakfast.    ? montelukast (SINGULAIR) 10 MG tablet Take 10 mg by mouth in the morning.    ? Multiple Vitamins-Minerals (HAIR/SKIN/NAILS PO) Take 2 tablets by mouth every morning.     ? Multiple Vitamins-Minerals (MULTIVITAMIN GUMMIES ADULT PO) Take 2 tablets by mouth every morning.    ? nortriptyline (PAMELOR) 10 MG capsule Take 20 mg by mouth at bedtime.    ? oxybutynin (DITROPAN-XL) 5 MG 24 hr tablet Take 5 mg by  mouth at bedtime.    ? pantoprazole (PROTONIX) 40 MG tablet Take 40 mg by mouth daily before breakfast.     ? Polyethyl Glycol-Propyl Glycol 0.4-0.3 % SOLN Place 1 drop into both eyes in the morning.    ? pregabalin (LYRICA) 150 MG capsule Take 150 mg by mouth at bedtime.     ? rivaroxaban (XARELTO) 20 MG TABS tablet Take 20 mg by mouth daily.    ? Secukinumab (COSENTYX) 150 MG/ML SOSY Inject 150 mg into the skin every 30 (thirty) days.    ? topiramate (TOPAMAX) 100 MG tablet Take 200 mg by mouth at bedtime.     ? venlafaxine XR (EFFEXOR-XR) 75 MG 24 hr capsule Take 75 mg by mouth daily.    ? vitamin C (ASCORBIC ACID) 250 MG tablet Take 500 mg by mouth daily.    ? ?No current facility-administered medications for this visit.  ?  ? ?REVIEW OF SYSTEMS:   ?Constitutional: Denies fevers, chills or night sweats ?Eyes: Denies blurriness of vision ?Ears, nose, mouth, throat, and  face: Denies mucositis or sore throat ?Respiratory: Denies cough, dyspnea or wheezes ?Cardiovascular: Denies palpitation, chest discomfort or lower extremity swelling ?Gastrointestinal:  Denies nausea, heartburn or change in bowel habits ?Skin: Denies abnormal skin rashes ?Lymphatics: Denies new lymphadenopathy or easy bruising ?Neurological:Denies numbness, tingling or new weaknesses ?Behavioral/Psych: Mood is stable, no new changes  ?All other systems were reviewed with the patient and are negative. ? ?PHYSICAL EXAMINATION: ?ECOG PERFORMANCE STATUS: 1 - Symptomatic but completely ambulatory ? ?Vitals:  ? 01/15/22 0818  ?BP: 131/82  ?Pulse: 90  ?Resp: 18  ?Temp: (!) 97.4 ?F (36.3 ?C)  ?SpO2: 100%  ? ?Filed Weights  ? 01/15/22 0818  ?Weight: 196 lb 12.8 oz (89.3 kg)  ? ? ?GENERAL:alert, no distress and comfortable ?NEURO: alert & oriented x 3 with fluent speech, no focal motor/sensory deficits ? ?LABORATORY DATA:  ?I have reviewed the data as listed ? ?   ?Component Value Date/Time  ? NA 138 11/08/2021 0339  ? K 3.9 11/08/2021 0339  ? CL 106  11/08/2021 0339  ? CO2 24 11/08/2021 0339  ? GLUCOSE 143 (H) 11/08/2021 0339  ? BUN 10 11/08/2021 0339  ? CREATININE 0.69 11/08/2021 0339  ? CALCIUM 8.4 (L) 11/08/2021 0339  ? PROT 7.2 11/07/2021 1223  ? ALBUMIN

## 2022-01-15 NOTE — Assessment & Plan Note (Signed)
She denies bleeding complications from Xarelto ?She could not tolerate Xarelto because she felt it exacerbated her fibromyalgia ?She read the package insert and back pain was listed as a side effect of Xarelto ?I tried to explain to the patient mechanism of action of Xarelto ?Does not seem to have a direct correlation/effect to cause exacerbation of fibromyalgia ?We discussed other options including using Lovenox or warfarin ?There is no guarantee that switching her from one brand of NOAC to another would reduce her likelihood of having exacerbation of fibromyalgia ?Ultimately, the patient has made informed decision to switch to Eliquis ?I will send prescription to her pharmacy; I told the patient there is a likelihood that her insurance may not pay for Eliquis if this is not a preferred brand but we will try to help her with filing for appeal if needed ?As soon as we get verification that Eliquis is filled, we will remove Xarelto off her medication list ?

## 2022-01-15 NOTE — Addendum Note (Signed)
Addended by: Flo Shanks on: 01/15/2022 09:09 AM ? ? Modules accepted: Orders ? ?

## 2022-02-15 ENCOUNTER — Encounter (HOSPITAL_COMMUNITY): Payer: Medicare Other

## 2022-02-15 ENCOUNTER — Other Ambulatory Visit: Payer: Self-pay

## 2022-02-15 ENCOUNTER — Ambulatory Visit (HOSPITAL_BASED_OUTPATIENT_CLINIC_OR_DEPARTMENT_OTHER)
Admission: RE | Admit: 2022-02-15 | Discharge: 2022-02-15 | Disposition: A | Discharge status: Home / Self Care | Payer: Medicare Other | Source: Ambulatory Visit | Attending: Hematology and Oncology | Admitting: Hematology and Oncology

## 2022-02-15 ENCOUNTER — Inpatient Hospital Stay: Payer: Medicare Other | Attending: Hematology and Oncology

## 2022-02-15 DIAGNOSIS — Z79899 Other long term (current) drug therapy: Secondary | ICD-10-CM | POA: Insufficient documentation

## 2022-02-15 DIAGNOSIS — I824Z1 Acute embolism and thrombosis of unspecified deep veins of right distal lower extremity: Secondary | ICD-10-CM | POA: Diagnosis present

## 2022-02-15 DIAGNOSIS — Z7901 Long term (current) use of anticoagulants: Secondary | ICD-10-CM | POA: Diagnosis not present

## 2022-02-15 DIAGNOSIS — D509 Iron deficiency anemia, unspecified: Secondary | ICD-10-CM | POA: Diagnosis not present

## 2022-02-15 LAB — CBC WITH DIFFERENTIAL (CANCER CENTER ONLY)
Abs Immature Granulocytes: 0.01 10*3/uL (ref 0.00–0.07)
Basophils Absolute: 0 10*3/uL (ref 0.0–0.1)
Basophils Relative: 0 %
Eosinophils Absolute: 0.4 10*3/uL (ref 0.0–0.5)
Eosinophils Relative: 6 %
HCT: 37.8 % (ref 36.0–46.0)
Hemoglobin: 11.6 g/dL — ABNORMAL LOW (ref 12.0–15.0)
Immature Granulocytes: 0 %
Lymphocytes Relative: 42 %
Lymphs Abs: 2.8 10*3/uL (ref 0.7–4.0)
MCH: 22 pg — ABNORMAL LOW (ref 26.0–34.0)
MCHC: 30.7 g/dL (ref 30.0–36.0)
MCV: 71.6 fL — ABNORMAL LOW (ref 80.0–100.0)
Monocytes Absolute: 0.4 10*3/uL (ref 0.1–1.0)
Monocytes Relative: 6 %
Neutro Abs: 3.1 10*3/uL (ref 1.7–7.7)
Neutrophils Relative %: 46 %
Platelet Count: 287 10*3/uL (ref 150–400)
RBC: 5.28 MIL/uL — ABNORMAL HIGH (ref 3.87–5.11)
RDW: 16.8 % — ABNORMAL HIGH (ref 11.5–15.5)
WBC Count: 6.7 10*3/uL (ref 4.0–10.5)
nRBC: 0 % (ref 0.0–0.2)

## 2022-02-15 LAB — BASIC METABOLIC PANEL - CANCER CENTER ONLY
Anion gap: 7 (ref 5–15)
BUN: 7 mg/dL (ref 6–20)
CO2: 30 mmol/L (ref 22–32)
Calcium: 9.1 mg/dL (ref 8.9–10.3)
Chloride: 101 mmol/L (ref 98–111)
Creatinine: 0.86 mg/dL (ref 0.44–1.00)
GFR, Estimated: >60 mL/min (ref >60)
Glucose, Bld: 100 mg/dL — ABNORMAL HIGH (ref 70–99)
Potassium: 3.8 mmol/L (ref 3.5–5.1)
Sodium: 138 mmol/L (ref 135–145)

## 2022-02-15 LAB — D-DIMER, QUANTITATIVE: D-Dimer, Quant: 2.91 ug/mL-FEU — ABNORMAL HIGH (ref 0.00–0.50)

## 2022-02-20 ENCOUNTER — Encounter: Payer: Self-pay | Admitting: Hematology and Oncology

## 2022-02-20 ENCOUNTER — Inpatient Hospital Stay: Payer: Medicare Other | Admitting: Hematology and Oncology

## 2022-02-20 ENCOUNTER — Other Ambulatory Visit: Payer: Self-pay

## 2022-02-20 DIAGNOSIS — D509 Iron deficiency anemia, unspecified: Secondary | ICD-10-CM | POA: Diagnosis not present

## 2022-02-20 DIAGNOSIS — I824Z1 Acute embolism and thrombosis of unspecified deep veins of right distal lower extremity: Secondary | ICD-10-CM | POA: Diagnosis not present

## 2022-02-20 NOTE — Assessment & Plan Note (Signed)
She has mild persistent microcytic anemia I will check iron study before her next visit

## 2022-02-20 NOTE — Progress Notes (Signed)
[facility] [facility] OFFICE PROGRESS NOTE  [name], [name], MD  ASSESSMENT & PLAN:  Acute deep vein thrombosis (DVT) of distal vein of right lower extremity ([place]) Despite resolution of her previous clot, her D-dimer was elevated For that reason, I do not recommend her to stop Eliquis We discussed other risk factors for recurrent thrombosis such as reduced mobility and inadequate hydration At this point in time, due to high risk of recurrent blood clot, I do not recommend her to stop Eliquis for her anticipated left knee surgery I recommend repeat blood work including D-dimer in 3 months  Microcytic anemia She has mild persistent microcytic anemia I will check iron study before her next visit  Orders Placed This Encounter  Procedures   Iron and Iron Binding Capacity ([facility] only)    Standing Status:   Future    Standing Expiration Date:   02/21/2023   Ferritin    Standing Status:   Future    Standing Expiration Date:   02/20/2023   CBC with Differential (Cancer Center Only)    Standing Status:   Future    Standing Expiration Date:   7/40/8144   Basic Metabolic Panel - [facility] Only    Standing Status:   Future    Standing Expiration Date:   02/21/2023   D-dimer, quantitative    Standing Status:   Future    Standing Expiration Date:   02/21/2023    The total time spent in the appointment was 20 minutes encounter with patients including review of chart and various tests results, discussions about plan of care and coordination of care plan   All questions were answered. The patient knows to call the clinic with any problems, questions or concerns. No barriers to learning was detected.    Heath Lark, MD 5/30/20231:32 PM  INTERVAL HISTORY: REILLY MOLCHAN 56 y.o. female returns for follow-up for history of DVT on apixaban She denies missing doses No recent fall, injury, bruising or recent bleeding She desire for left knee surgery in the near future  SUMMARY OF  HEMATOLOGIC HISTORY: The patient had chronic degenerative joint disease and underwent right knee surgery on November 07, 2021 10 days after surgery, she developed right lower extremity pain and swelling Venous Doppler ultrasound showed: RIGHT:  - Findings consistent with acute deep vein thrombosis involving the right common femoral vein, and right femoral vein.  - No cystic structure found in the popliteal fossa.  - Common femoral vein obstruction doesn't appear to extend above inguinal ligament.     LEFT:  - No evidence of deep vein thrombosis in the lower extremity. No indirect evidence of obstruction proximal to the inguinal ligament.  - No cystic structure found in the popliteal fossa.     She was placed on Xarelto and is doing well She is compliant taking anticoagulation therapy She has no bleeding complications She denies further right lower extremity pain or swelling She is using elastic compression hose on a regular basis She had numerous surgeries in the past including right shoulder surgery, hysterectomy and others  She has also taken birth control pill for close to 15 years without complications  She denies recent chest pain on exertion, shortness of breath on minimal exertion, pre-syncopal episodes, hemoptysis, or palpitation.  She had no prior history or diagnosis of cancer. Her age appropriate screening programs are up-to-date. She had prior surgeries before and never had perioperative thromboembolic events.  The patient had been pregnant before and denies  history of peripartum thromboembolic event; she has 2 early trimester miscarriages. There is no family history of blood clots or miscarriages.  US venous Doppler 02/15/22 BILATERAL: - No evidence of deep vein thrombosis seen in the lower extremities, bilaterally. -No evidence of popliteal cyst, bilaterally. RIGHT: - Findings suggest resolution of previously noted thrombus.  I have reviewed the past medical history,  past surgical history, social history and family history with the patient and they are unchanged from previous note.  ALLERGIES:  is allergic to xarelto [rivaroxaban], albuterol, hydrocodone-acetaminophen, maxalt [rizatriptan], penicillins, and voltaren [diclofenac sodium].  MEDICATIONS:  Current Outpatient Medications  Medication Sig Dispense Refill   apixaban (ELIQUIS) 5 MG TABS tablet Take 1 tablet (5 mg total) by mouth 2 (two) times daily. 60 tablet 3   atorvastatin (LIPITOR) 10 MG tablet Take 10 mg by mouth in the morning.     cetirizine (ZYRTEC) 10 MG tablet Take 10 mg by mouth in the morning.     Cholecalciferol (VITAMIN D) 2000 units tablet Take 4,000 Units by mouth daily.     cyclobenzaprine (FLEXERIL) 10 MG tablet Take 20 mg by mouth at bedtime.     diclofenac Sodium (VOLTAREN) 1 % GEL Apply 2 g topically 4 (four) times daily. 100 g 0   famotidine (PEPCID) 40 MG tablet Take 40 mg by mouth at bedtime.     ferrous sulfate 325 (65 FE) MG tablet Take 325 mg by mouth in the morning.     fluticasone (FLONASE) 50 MCG/ACT nasal spray Place 2 sprays into both nostrils daily.     Fluticasone-Salmeterol (ADVAIR) 250-50 MCG/DOSE AEPB Inhale 1 puff into the lungs 2 (two) times daily.     folic acid (FOLVITE) 1 MG tablet Take 1 mg by mouth in the morning.     ipratropium (ATROVENT HFA) 17 MCG/ACT inhaler Inhale 2 puffs into the lungs every 6 (six) hours as needed for wheezing.      linaclotide (LINZESS) 72 MCG capsule Take 72 mcg by mouth daily before breakfast.     montelukast (SINGULAIR) 10 MG tablet Take 10 mg by mouth in the morning.     Multiple Vitamins-Minerals (HAIR/SKIN/NAILS PO) Take 2 tablets by mouth every morning.      Multiple Vitamins-Minerals (MULTIVITAMIN GUMMIES ADULT PO) Take 2 tablets by mouth every morning.     nortriptyline (PAMELOR) 10 MG capsule Take 20 mg by mouth at bedtime.     oxybutynin (DITROPAN-XL) 5 MG 24 hr tablet Take 5 mg by mouth at bedtime.     pantoprazole  (PROTONIX) 40 MG tablet Take 40 mg by mouth daily before breakfast.      Polyethyl Glycol-Propyl Glycol 0.4-0.3 % SOLN Place 1 drop into both eyes in the morning.     pregabalin (LYRICA) 150 MG capsule Take 150 mg by mouth at bedtime.      Secukinumab (COSENTYX) 150 MG/ML SOSY Inject 150 mg into the skin every 30 (thirty) days.     topiramate (TOPAMAX) 100 MG tablet Take 200 mg by mouth at bedtime.      venlafaxine XR (EFFEXOR-XR) 75 MG 24 hr capsule Take 75 mg by mouth daily.     vitamin C (ASCORBIC ACID) 250 MG tablet Take 500 mg by mouth daily.     No current facility-administered medications for this visit.     REVIEW OF SYSTEMS:   Constitutional: Denies fevers, chills or night sweats Eyes: Denies blurriness of vision Ears, nose, mouth, throat, and face: Denies mucositis or sore throat  Respiratory: Denies cough, dyspnea or wheezes Cardiovascular: Denies palpitation, chest discomfort or lower extremity swelling Gastrointestinal:  Denies nausea, heartburn or change in bowel habits Skin: Denies abnormal skin rashes Lymphatics: Denies new lymphadenopathy or easy bruising Neurological:Denies numbness, tingling or new weaknesses Behavioral/Psych: Mood is stable, no new changes  All other systems were reviewed with the patient and are negative.  PHYSICAL EXAMINATION: ECOG PERFORMANCE STATUS: 1 - Symptomatic but completely ambulatory  Vitals:   02/20/22 1253  BP: 134/88  Pulse: (!) 110  Resp: 18  Temp: 98 F (36.7 C)  SpO2: 100%   Filed Weights   02/20/22 1253  Weight: 203 lb 12.8 oz (92.4 kg)    GENERAL:alert, no distress and comfortable NEURO: alert & oriented x 3 with fluent speech, no focal motor/sensory deficits  LABORATORY DATA:  I have reviewed the data as listed     Component Value Date/Time   NA 138 02/15/2022 1306   K 3.8 02/15/2022 1306   CL 101 02/15/2022 1306   CO2 30 02/15/2022 1306   GLUCOSE 100 (H) 02/15/2022 1306   BUN 7 02/15/2022 1306   CREATININE  0.86 02/15/2022 1306   CALCIUM 9.1 02/15/2022 1306   PROT 7.2 11/07/2021 1223   ALBUMIN 3.6 11/07/2021 1223   AST 20 11/07/2021 1223   ALT 15 11/07/2021 1223   ALKPHOS 68 11/07/2021 1223   BILITOT 0.5 11/07/2021 1223   GFRNONAA >60 02/15/2022 1306   GFRAA >60 04/13/2018 1926    No results found for: SPEP, UPEP  Lab Results  Component Value Date   WBC 6.7 02/15/2022   NEUTROABS 3.1 02/15/2022   HGB 11.6 (L) 02/15/2022   HCT 37.8 02/15/2022   MCV 71.6 (L) 02/15/2022   PLT 287 02/15/2022      Chemistry      Component Value Date/Time   NA 138 02/15/2022 1306   K 3.8 02/15/2022 1306   CL 101 02/15/2022 1306   CO2 30 02/15/2022 1306   BUN 7 02/15/2022 1306   CREATININE 0.86 02/15/2022 1306      Component Value Date/Time   CALCIUM 9.1 02/15/2022 1306   ALKPHOS 68 11/07/2021 1223   AST 20 11/07/2021 1223   ALT 15 11/07/2021 1223   BILITOT 0.5 11/07/2021 1223

## 2022-02-20 NOTE — Assessment & Plan Note (Signed)
Despite resolution of her previous clot, her D-dimer was elevated For that reason, I do not recommend her to stop Eliquis We discussed other risk factors for recurrent thrombosis such as reduced mobility and inadequate hydration At this point in time, due to high risk of recurrent blood clot, I do not recommend her to stop Eliquis for her anticipated left knee surgery I recommend repeat blood work including D-dimer in 3 months

## 2022-05-15 ENCOUNTER — Other Ambulatory Visit: Payer: Self-pay | Admitting: Hematology and Oncology

## 2022-05-23 ENCOUNTER — Inpatient Hospital Stay: Payer: Medicare HMO | Attending: Hematology and Oncology

## 2022-05-23 ENCOUNTER — Other Ambulatory Visit: Payer: Self-pay

## 2022-05-23 DIAGNOSIS — D509 Iron deficiency anemia, unspecified: Secondary | ICD-10-CM

## 2022-05-23 DIAGNOSIS — Z86718 Personal history of other venous thrombosis and embolism: Secondary | ICD-10-CM | POA: Insufficient documentation

## 2022-05-23 DIAGNOSIS — N189 Chronic kidney disease, unspecified: Secondary | ICD-10-CM | POA: Diagnosis not present

## 2022-05-23 DIAGNOSIS — Z7901 Long term (current) use of anticoagulants: Secondary | ICD-10-CM | POA: Diagnosis not present

## 2022-05-23 DIAGNOSIS — I824Z1 Acute embolism and thrombosis of unspecified deep veins of right distal lower extremity: Secondary | ICD-10-CM

## 2022-05-23 DIAGNOSIS — Z79899 Other long term (current) drug therapy: Secondary | ICD-10-CM | POA: Diagnosis not present

## 2022-05-23 LAB — CBC WITH DIFFERENTIAL (CANCER CENTER ONLY)
Abs Immature Granulocytes: 0.01 10*3/uL (ref 0.00–0.07)
Basophils Absolute: 0 10*3/uL (ref 0.0–0.1)
Basophils Relative: 0 %
Eosinophils Absolute: 0.4 10*3/uL (ref 0.0–0.5)
Eosinophils Relative: 5 %
HCT: 37.2 % (ref 36.0–46.0)
Hemoglobin: 11.6 g/dL — ABNORMAL LOW (ref 12.0–15.0)
Immature Granulocytes: 0 %
Lymphocytes Relative: 47 %
Lymphs Abs: 3 10*3/uL (ref 0.7–4.0)
MCH: 22.4 pg — ABNORMAL LOW (ref 26.0–34.0)
MCHC: 31.2 g/dL (ref 30.0–36.0)
MCV: 71.7 fL — ABNORMAL LOW (ref 80.0–100.0)
Monocytes Absolute: 0.3 10*3/uL (ref 0.1–1.0)
Monocytes Relative: 5 %
Neutro Abs: 2.8 10*3/uL (ref 1.7–7.7)
Neutrophils Relative %: 43 %
Platelet Count: 248 10*3/uL (ref 150–400)
RBC: 5.19 MIL/uL — ABNORMAL HIGH (ref 3.87–5.11)
RDW: 15.9 % — ABNORMAL HIGH (ref 11.5–15.5)
WBC Count: 6.5 10*3/uL (ref 4.0–10.5)
nRBC: 0 % (ref 0.0–0.2)

## 2022-05-23 LAB — IRON AND IRON BINDING CAPACITY (CC-WL,HP ONLY)
Iron: 48 ug/dL (ref 28–170)
Saturation Ratios: 17 % (ref 10.4–31.8)
TIBC: 291 ug/dL (ref 250–450)
UIBC: 243 ug/dL (ref 148–442)

## 2022-05-23 LAB — BASIC METABOLIC PANEL - CANCER CENTER ONLY
Anion gap: 4 — ABNORMAL LOW (ref 5–15)
BUN: 11 mg/dL (ref 6–20)
CO2: 31 mmol/L (ref 22–32)
Calcium: 9 mg/dL (ref 8.9–10.3)
Chloride: 105 mmol/L (ref 98–111)
Creatinine: 0.8 mg/dL (ref 0.44–1.00)
GFR, Estimated: >60 mL/min (ref >60)
Glucose, Bld: 111 mg/dL — ABNORMAL HIGH (ref 70–99)
Potassium: 3.8 mmol/L (ref 3.5–5.1)
Sodium: 140 mmol/L (ref 135–145)

## 2022-05-23 LAB — D-DIMER, QUANTITATIVE: D-Dimer, Quant: 0.75 ug/mL-FEU — ABNORMAL HIGH (ref 0.00–0.50)

## 2022-05-23 LAB — FERRITIN: Ferritin: 30 ng/mL (ref 11–307)

## 2022-05-25 ENCOUNTER — Encounter: Payer: Self-pay | Admitting: Hematology and Oncology

## 2022-05-25 ENCOUNTER — Inpatient Hospital Stay: Payer: Medicare HMO | Attending: Hematology and Oncology | Admitting: Hematology and Oncology

## 2022-05-25 ENCOUNTER — Other Ambulatory Visit: Payer: Self-pay

## 2022-05-25 DIAGNOSIS — I824Z1 Acute embolism and thrombosis of unspecified deep veins of right distal lower extremity: Secondary | ICD-10-CM

## 2022-05-25 DIAGNOSIS — Z79899 Other long term (current) drug therapy: Secondary | ICD-10-CM | POA: Diagnosis not present

## 2022-05-25 DIAGNOSIS — Z7901 Long term (current) use of anticoagulants: Secondary | ICD-10-CM | POA: Insufficient documentation

## 2022-05-25 DIAGNOSIS — Z86718 Personal history of other venous thrombosis and embolism: Secondary | ICD-10-CM | POA: Insufficient documentation

## 2022-05-25 NOTE — Assessment & Plan Note (Signed)
She has completed 6 months of anticoagulation therapy D-dimer is almost normal Her prior ultrasound showed no evidence of persistent DVT I recommend the patient to discontinue Eliquis I gave her a letter to bring to her orthopedic surgeon to clear her for future right knee surgery The patient would still be at high risk for recurrent DVT I recommend the patient to resume taking Eliquis within 24 hours after surgery for minimum of 3 months I plan to see her at the end of 3 months for further assessment

## 2022-05-25 NOTE — Progress Notes (Signed)
[place] OFFICE PROGRESS NOTE  [name], [name], MD  ASSESSMENT & PLAN:  Acute deep vein thrombosis (DVT) of distal vein of right lower extremity ([place]) She has completed 6 months of anticoagulation therapy D-dimer is almost normal Her prior ultrasound showed no evidence of persistent DVT I recommend the patient to discontinue Eliquis I gave her a letter to bring to her orthopedic surgeon to clear her for future right knee surgery The patient would still be at high risk for recurrent DVT I recommend the patient to resume taking Eliquis within 24 hours after surgery for minimum of 3 months I plan to see her at the end of 3 months for further assessment  Orders Placed This Encounter  Procedures   Basic Metabolic Panel - [facility] Only    Standing Status:   Future    Standing Expiration Date:   05/26/2023   CBC with Differential (Cancer Center Only)    Standing Status:   Future    Standing Expiration Date:   05/26/2023   D-dimer, quantitative    Standing Status:   Future    Standing Expiration Date:   05/26/2023    The total time spent in the appointment was 20 minutes encounter with patients including review of chart and various tests results, discussions about plan of care and coordination of care plan   All questions were answered. The patient knows to call the clinic with any problems, questions or concerns. No barriers to learning was detected.    Heath Lark, MD 9/1/20233:30 PM  INTERVAL HISTORY: Christine Bradley 56 y.o. female returns for further follow-up and evaluation for prior history of DVT She is doing well No recent bleeding She desire surgery to the right knee in the near future  SUMMARY OF HEMATOLOGIC HISTORY: The patient had chronic degenerative joint disease and underwent right knee surgery on November 07, 2021 10 days after surgery, she developed right lower extremity pain and swelling Venous Doppler ultrasound showed: RIGHT:  - Findings  consistent with acute deep vein thrombosis involving the right common femoral vein, and right femoral vein.  - No cystic structure found in the popliteal fossa.  - Common femoral vein obstruction doesn't appear to extend above inguinal ligament.     LEFT:  - No evidence of deep vein thrombosis in the lower extremity. No indirect evidence of obstruction proximal to the inguinal ligament.  - No cystic structure found in the popliteal fossa.     She was placed on Xarelto and is doing well She is compliant taking anticoagulation therapy She has no bleeding complications She denies further right lower extremity pain or swelling She is using elastic compression hose on a regular basis She had numerous surgeries in the past including right shoulder surgery, hysterectomy and others  She has also taken birth control pill for close to 15 years without complications  She denies recent chest pain on exertion, shortness of breath on minimal exertion, pre-syncopal episodes, hemoptysis, or palpitation.  She had no prior history or diagnosis of cancer. Her age appropriate screening programs are up-to-date. She had prior surgeries before and never had perioperative thromboembolic events.  The patient had been pregnant before and denies history of peripartum thromboembolic event; she has 2 early trimester miscarriages. There is no family history of blood clots or miscarriages.  US venous Doppler 02/15/22 BILATERAL: - No evidence of deep vein thrombosis seen in the lower extremities, bilaterally. -No evidence of popliteal cyst, bilaterally. RIGHT: - Findings  suggest resolution of previously noted thrombus. Patient is placed on 6 months of anticoagulation therapy with Eliquis, discontinue on May 25, 2022  I have reviewed the past medical history, past surgical history, social history and family history with the patient and they are unchanged from previous note.  ALLERGIES:  is allergic to xarelto  [rivaroxaban], albuterol, hydrocodone-acetaminophen, maxalt [rizatriptan], penicillins, and voltaren [diclofenac sodium].  MEDICATIONS:  Current Outpatient Medications  Medication Sig Dispense Refill   atorvastatin (LIPITOR) 10 MG tablet Take 10 mg by mouth in the morning.     cetirizine (ZYRTEC) 10 MG tablet Take 10 mg by mouth in the morning.     Cholecalciferol (VITAMIN D) 2000 units tablet Take 4,000 Units by mouth daily.     cyclobenzaprine (FLEXERIL) 10 MG tablet Take 20 mg by mouth at bedtime.     diclofenac Sodium (VOLTAREN) 1 % GEL Apply 2 g topically 4 (four) times daily. 100 g 0   famotidine (PEPCID) 40 MG tablet Take 40 mg by mouth at bedtime.     ferrous sulfate 325 (65 FE) MG tablet Take 325 mg by mouth in the morning.     fluticasone (FLONASE) 50 MCG/ACT nasal spray Place 2 sprays into both nostrils daily.     Fluticasone-Salmeterol (ADVAIR) 250-50 MCG/DOSE AEPB Inhale 1 puff into the lungs 2 (two) times daily.     folic acid (FOLVITE) 1 MG tablet Take 1 mg by mouth in the morning.     ipratropium (ATROVENT HFA) 17 MCG/ACT inhaler Inhale 2 puffs into the lungs every 6 (six) hours as needed for wheezing.      linaclotide (LINZESS) 72 MCG capsule Take 72 mcg by mouth daily before breakfast.     montelukast (SINGULAIR) 10 MG tablet Take 10 mg by mouth in the morning.     Multiple Vitamins-Minerals (HAIR/SKIN/NAILS PO) Take 2 tablets by mouth every morning.      Multiple Vitamins-Minerals (MULTIVITAMIN GUMMIES ADULT PO) Take 2 tablets by mouth every morning.     oxybutynin (DITROPAN-XL) 5 MG 24 hr tablet Take 5 mg by mouth at bedtime.     pantoprazole (PROTONIX) 40 MG tablet Take 40 mg by mouth daily before breakfast.      Polyethyl Glycol-Propyl Glycol 0.4-0.3 % SOLN Place 1 drop into both eyes in the morning.     pregabalin (LYRICA) 150 MG capsule Take 150 mg by mouth at bedtime.      Secukinumab (COSENTYX) 150 MG/ML SOSY Inject 150 mg into the skin every 30 (thirty) days.      venlafaxine XR (EFFEXOR-XR) 75 MG 24 hr capsule Take 150 mg by mouth daily.     vitamin C (ASCORBIC ACID) 250 MG tablet Take 500 mg by mouth daily.     No current facility-administered medications for this visit.     REVIEW OF SYSTEMS:   Constitutional: Denies fevers, chills or night sweats Eyes: Denies blurriness of vision Ears, nose, mouth, throat, and face: Denies mucositis or sore throat Respiratory: Denies cough, dyspnea or wheezes Cardiovascular: Denies palpitation, chest discomfort or lower extremity swelling Gastrointestinal:  Denies nausea, heartburn or change in bowel habits Skin: Denies abnormal skin rashes Lymphatics: Denies new lymphadenopathy or easy bruising Neurological:Denies numbness, tingling or new weaknesses Behavioral/Psych: Mood is stable, no new changes  All other systems were reviewed with the patient and are negative.  PHYSICAL EXAMINATION: ECOG PERFORMANCE STATUS: 0 - Asymptomatic  Vitals:   05/25/22 0809  BP: (!) 145/84  Pulse: (!) 103  Resp: 18  Temp: 98 F (36.7 C)  SpO2: 100%   Filed Weights   05/25/22 0809  Weight: 210 lb (95.3 kg)    GENERAL:alert, no distress and comfortable NEURO: alert & oriented x 3 with fluent speech, no focal motor/sensory deficits  LABORATORY DATA:  I have reviewed the data as listed     Component Value Date/Time   NA 140 05/23/2022 0802   K 3.8 05/23/2022 0802   CL 105 05/23/2022 0802   CO2 31 05/23/2022 0802   GLUCOSE 111 (H) 05/23/2022 0802   BUN 11 05/23/2022 0802   CREATININE 0.80 05/23/2022 0802   CALCIUM 9.0 05/23/2022 0802   PROT 7.2 11/07/2021 1223   ALBUMIN 3.6 11/07/2021 1223   AST 20 11/07/2021 1223   ALT 15 11/07/2021 1223   ALKPHOS 68 11/07/2021 1223   BILITOT 0.5 11/07/2021 1223   GFRNONAA >60 05/23/2022 0802   GFRAA >60 04/13/2018 1926    No results found for: "SPEP", "UPEP"  Lab Results  Component Value Date   WBC 6.5 05/23/2022   NEUTROABS 2.8 05/23/2022   HGB 11.6 (L)  05/23/2022   HCT 37.2 05/23/2022   MCV 71.7 (L) 05/23/2022   PLT 248 05/23/2022      Chemistry      Component Value Date/Time   NA 140 05/23/2022 0802   K 3.8 05/23/2022 0802   CL 105 05/23/2022 0802   CO2 31 05/23/2022 0802   BUN 11 05/23/2022 0802   CREATININE 0.80 05/23/2022 0802      Component Value Date/Time   CALCIUM 9.0 05/23/2022 0802   ALKPHOS 68 11/07/2021 1223   AST 20 11/07/2021 1223   ALT 15 11/07/2021 1223   BILITOT 0.5 11/07/2021 1223

## 2022-05-30 ENCOUNTER — Telehealth: Payer: Self-pay

## 2022-05-30 NOTE — Telephone Encounter (Addendum)
She called and left a message that she is scheduled for knee surgery on 10/17.

## 2022-05-31 ENCOUNTER — Other Ambulatory Visit: Payer: Self-pay | Admitting: Hematology and Oncology

## 2022-05-31 NOTE — Telephone Encounter (Signed)
I will put in order to see her back in Jan Thanks

## 2022-05-31 NOTE — Telephone Encounter (Signed)
Called and told her Dr. Alvy Bimler will send a scheduling message to see her in January. She verbalized understanding.

## 2022-06-06 ENCOUNTER — Other Ambulatory Visit: Payer: Self-pay | Admitting: Family Medicine

## 2022-06-26 ENCOUNTER — Other Ambulatory Visit: Payer: Self-pay | Admitting: Family Medicine

## 2022-06-26 DIAGNOSIS — N644 Mastodynia: Secondary | ICD-10-CM

## 2022-06-26 NOTE — Progress Notes (Addendum)
COVID Vaccine Completed: yes x2  Date of COVID positive in last 90 days: no  PCP - Suzanna Obey, MD Cardiologist - n/a   Medical clearance by Suzanna Obey 06/27/22 on chart  Chest x-ray - n/a EKG - 06/29/22 Epic/chart Stress Test - yes many years ago per pt ECHO - n/a Cardiac Cath - n/a Pacemaker/ICD device last checked: no Spinal Cord Stimulator: no  Bowel Prep - no  Sleep Study - yes, positive CPAP - not yet per pt, needs machine  Fasting Blood Sugar - preDM Checks Blood Sugar no checks at home  Blood Thinner Instructions: Aspirin Instructions: Last Dose: Eliquis last dose was end of august per pt. Needs to resume after surgery  Activity level: Can go up a flight of stairs and perform activities of daily living without stopping and without symptoms of chest pain or shortness of breath.     Anesthesia review: DVT not currently on blood thinners, HTN  Patient denies shortness of breath, fever, cough and chest pain at PAT appointment  Patient verbalized understanding of instructions that were given to them at the PAT appointment. Patient was also instructed that they will need to review over the PAT instructions again at home before surgery.

## 2022-06-28 NOTE — Patient Instructions (Addendum)
SURGICAL WAITING ROOM VISITATION Patients having surgery or a procedure may have no more than 2 support people in the waiting area - these visitors may rotate.   Children under the age of 29 must have an adult with them who is not the patient. If the patient needs to stay at the hospital during part of their recovery, the visitor guidelines for inpatient rooms apply. Pre-op nurse will coordinate an appropriate time for 1 support person to accompany patient in pre-op.  This support person may not rotate.    Please refer to the [facility] website for the visitor guidelines for Inpatients (after your surgery is over and you are in a regular room).    Your procedure is scheduled on: 07/10/22   Report to [facility] Main Entrance    Report to admitting at 10:25 AM   Call this number if you have problems the morning of surgery [phone]   Do not eat food :After Midnight.   After Midnight you may have the following liquids until 9:55 AM DAY OF SURGERY  Water Non-Citrus Juices (without pulp, NO RED) Carbonated Beverages Black Coffee (NO MILK/CREAM OR CREAMERS, sugar ok)  Clear Tea (NO MILK/CREAM OR CREAMERS, sugar ok) regular and decaf                             Plain Jell-O (NO RED)                                           Fruit ices (not with fruit pulp, NO RED)                                     Popsicles (NO RED)                                                               Sports drinks like Gatorade (NO RED)             The day of surgery:  Drink ONE (1) Pre-Surgery Clear Ensure at 9:55 AM the morning of surgery. Drink in one sitting. Do not sip.  This drink was given to you during your hospital  pre-op appointment visit. Nothing else to drink after completing the  Pre-Surgery Clear Ensure.          If you have questions, please contact your surgeon's office.   FOLLOW BOWEL PREP AND ANY ADDITIONAL PRE OP INSTRUCTIONS YOU RECEIVED FROM YOUR SURGEON'S OFFICE!!!      Oral Hygiene is also important to reduce your risk of infection.                                    Remember - BRUSH YOUR TEETH THE MORNING OF SURGERY WITH YOUR REGULAR TOOTHPASTE   Take these medicines the morning of surgery with A SIP OF WATER: Amlodipine, Atorvastatin, Zyrtec, Pantoprazole, Effexor  You may not have any metal on your body including hair pins, jewelry, and body piercing             Do not wear make-up, lotions, powders, perfumes, or deodorant  Do not wear nail polish including gel and S&S, artificial/acrylic nails, or any other type of covering on natural nails including finger and toenails. If you have artificial nails, gel coating, etc. that needs to be removed by a nail salon please have this removed prior to surgery or surgery may need to be canceled/ delayed if the surgeon/ anesthesia feels like they are unable to be safely monitored.   Do not shave  48 hours prior to surgery.    Do not bring valuables to the hospital. [place].   Bring small overnight bag day of surgery.   DO NOT [place]. PHARMACY WILL DISPENSE MEDICATIONS LISTED ON YOUR MEDICATION LIST TO YOU DURING YOUR ADMISSION [place]!              Please read over the following fact sheets you were given: IF [place] [phone] Schneiders   If you received a COVID test during your pre-op visit  it is requested that you wear a mask when out in public, stay away from anyone that may not be feeling well and notify your surgeon if you develop symptoms. If you test positive for Covid or have been in contact with anyone that has tested positive in the last 10 days please notify you surgeon.     [facility] - Preparing for Surgery Before surgery, you can play an important role.  Because skin is not sterile, your skin needs to be as free of  germs as possible.  You can reduce the number of germs on your skin by washing with CHG (chlorahexidine gluconate) soap before surgery.  CHG is an antiseptic cleaner which kills germs and bonds with the skin to continue killing germs even after washing. Please DO NOT use if you have an allergy to CHG or antibacterial soaps.  If your skin becomes reddened/irritated stop using the CHG and inform your nurse when you arrive at Short Stay. Do not shave (including legs and underarms) for at least 48 hours prior to the first CHG shower.  You may shave your face/neck.  Please follow these instructions carefully:  1.  Shower with CHG Soap the night before surgery and the  morning of surgery.  2.  If you choose to wash your hair, wash your hair first as usual with your normal  shampoo.  3.  After you shampoo, rinse your hair and body thoroughly to remove the shampoo.                             4.  Use CHG as you would any other liquid soap.  You can apply chg directly to the skin and wash.  Gently with a scrungie or clean washcloth.  5.  Apply the CHG Soap to your body ONLY FROM THE NECK DOWN.   Do   not use on face/ open                           Wound or open sores. Avoid contact  with eyes, ears mouth and   genitals (private parts).                       Wash face,  Genitals (private parts) with your normal soap.             6.  Wash thoroughly, paying special attention to the area where your    surgery  will be performed.  7.  Thoroughly rinse your body with warm water from the neck down.  8.  DO NOT shower/wash with your normal soap after using and rinsing off the CHG Soap.                9.  Pat yourself dry with a clean towel.            10.  Wear clean pajamas.            11.  Place clean sheets on your bed the night of your first shower and do not  sleep with pets. Day of Surgery : Do not apply any lotions/deodorants the morning of surgery.  Please wear clean clothes to the hospital/surgery  center.  FAILURE TO FOLLOW THESE INSTRUCTIONS MAY RESULT IN THE CANCELLATION OF YOUR SURGERY  PATIENT SIGNATURE_________________________________  NURSE SIGNATURE__________________________________  ________________________________________________________________________   Adam Phenix  An incentive spirometer is a tool that can help keep your lungs clear and active. This tool measures how well you are filling your lungs with each breath. Taking long deep breaths may help reverse or decrease the chance of developing breathing (pulmonary) problems (especially infection) following: A long period of time when you are unable to move or be active. BEFORE THE PROCEDURE  If the spirometer includes an indicator to show your best effort, your nurse or respiratory therapist will set it to a desired goal. If possible, sit up straight or lean slightly forward. Try not to slouch. Hold the incentive spirometer in an upright position. INSTRUCTIONS FOR USE  Sit on the edge of your bed if possible, or sit up as far as you can in bed or on a chair. Hold the incentive spirometer in an upright position. Breathe out normally. Place the mouthpiece in your mouth and seal your lips tightly around it. Breathe in slowly and as deeply as possible, raising the piston or the ball toward the top of the column. Hold your breath for 3-5 seconds or for as long as possible. Allow the piston or ball to fall to the bottom of the column. Remove the mouthpiece from your mouth and breathe out normally. Rest for a few seconds and repeat Steps 1 through 7 at least 10 times every 1-2 hours when you are awake. Take your time and take a few normal breaths between deep breaths. The spirometer may include an indicator to show your best effort. Use the indicator as a goal to work toward during each repetition. After each set of 10 deep breaths, practice coughing to be sure your lungs are clear. If you have an incision (the cut  made at the time of surgery), support your incision when coughing by placing a pillow or rolled up towels firmly against it. Once you are able to get out of bed, walk around indoors and cough well. You may stop using the incentive spirometer when instructed by your caregiver.  RISKS AND COMPLICATIONS Take your time so you do not get dizzy or light-headed. If you are in pain, you may need to take or ask  for pain medication before doing incentive spirometry. It is harder to take a deep breath if you are having pain. AFTER USE Rest and breathe slowly and easily. It can be helpful to keep track of a log of your progress. Your caregiver can provide you with a simple table to help with this. If you are using the spirometer at home, follow these instructions: [place] IF:  You are having difficultly using the spirometer. You have trouble using the spirometer as often as instructed. Your pain medication is not giving enough relief while using the spirometer. You develop fever of 100.5 F (38.1 C) or higher. SEEK IMMEDIATE MEDICAL CARE IF:  You cough up bloody sputum that had not been present before. You develop fever of 102 F (38.9 C) or greater. You develop worsening pain at or near the incision site. MAKE SURE YOU:  Understand these instructions. Will watch your condition. Will get help right away if you are not doing well or get worse. Document Released: 01/21/2007 Document Revised: 12/03/2011 Document Reviewed: 03/24/2007 Exit[employer] Patient Information 2014 [employer], [place].   ________________________________________________________________________  WHAT IS A BLOOD TRANSFUSION? Blood Transfusion Information  A transfusion is the replacement of blood or some of its parts. Blood is made up of multiple cells which provide different functions. Red blood cells carry oxygen and are used for blood loss replacement. White blood cells fight against infection. Platelets control  bleeding. Plasma helps clot blood. Other blood products are available for specialized needs, such as hemophilia or other clotting disorders. BEFORE THE TRANSFUSION  Who gives blood for transfusions?  Healthy volunteers who are fully evaluated to make sure their blood is safe. This is blood bank blood. Transfusion therapy is the safest it has ever been in the practice of medicine. Before blood is taken from a donor, a complete history is taken to make sure that person has no history of diseases nor engages in risky social behavior (examples are intravenous drug use or sexual activity with multiple partners). The donor's travel history is screened to minimize risk of transmitting infections, such as malaria. The donated blood is tested for signs of infectious diseases, such as HIV and hepatitis. The blood is then tested to be sure it is compatible with you in order to minimize the chance of a transfusion reaction. If you or a relative donates blood, this is often done in anticipation of surgery and is not appropriate for emergency situations. It takes many days to process the donated blood. RISKS AND COMPLICATIONS Although transfusion therapy is very safe and saves many lives, the main dangers of transfusion include:  Getting an infectious disease. Developing a transfusion reaction. This is an allergic reaction to something in the blood you were given. Every precaution is taken to prevent this. The decision to have a blood transfusion has been considered carefully by your caregiver before blood is given. Blood is not given unless the benefits outweigh the risks. AFTER THE TRANSFUSION Right after receiving a blood transfusion, you will usually feel much better and more energetic. This is especially true if your red blood cells have gotten low (anemic). The transfusion raises the level of the red blood cells which carry oxygen, and this usually causes an energy increase. The nurse administering the  transfusion will monitor you carefully for complications. HOME CARE INSTRUCTIONS  No special instructions are needed after a transfusion. You may find your energy is better. Speak with your caregiver about any limitations on activity for underlying diseases you may  have. SEEK MEDICAL CARE IF:  Your condition is not improving after your transfusion. You develop redness or irritation at the intravenous (IV) site. SEEK IMMEDIATE MEDICAL CARE IF:  Any of the following symptoms occur over the next 12 hours: Shaking chills. You have a temperature by mouth above 102 F (38.9 C), not controlled by medicine. Chest, back, or muscle pain. People around you feel you are not acting correctly or are confused. Shortness of breath or difficulty breathing. Dizziness and fainting. You get a rash or develop hives. You have a decrease in urine output. Your urine turns a dark color or changes to pink, red, or brown. Any of the following symptoms occur over the next 10 days: You have a temperature by mouth above 102 F (38.9 C), not controlled by medicine. Shortness of breath. Weakness after normal activity. The white part of the eye turns yellow (jaundice). You have a decrease in the amount of urine or are urinating less often. Your urine turns a dark color or changes to pink, red, or brown. Document Released: 09/07/2000 Document Revised: 12/03/2011 Document Reviewed: 04/26/2008 [facility] Patient Information 2014 [place], [place].  _______________________________________________________________________

## 2022-06-29 ENCOUNTER — Encounter (HOSPITAL_COMMUNITY)
Admission: RE | Admit: 2022-06-29 | Discharge: 2022-06-29 | Disposition: A | Discharge status: Home / Self Care | Payer: Medicare HMO | Source: Ambulatory Visit | Attending: Orthopedic Surgery | Admitting: Orthopedic Surgery

## 2022-06-29 ENCOUNTER — Encounter (HOSPITAL_COMMUNITY): Payer: Self-pay

## 2022-06-29 VITALS — BP 156/98 | HR 78 | Temp 98.2°F | Resp 14 | Ht 64.0 in | Wt 205.0 lb

## 2022-06-29 DIAGNOSIS — M1712 Unilateral primary osteoarthritis, left knee: Secondary | ICD-10-CM | POA: Diagnosis not present

## 2022-06-29 DIAGNOSIS — R7303 Prediabetes: Secondary | ICD-10-CM | POA: Insufficient documentation

## 2022-06-29 DIAGNOSIS — Z01818 Encounter for other preprocedural examination: Secondary | ICD-10-CM | POA: Insufficient documentation

## 2022-06-29 HISTORY — DX: Essential (primary) hypertension: I10

## 2022-06-29 HISTORY — DX: Prediabetes: R73.03

## 2022-06-29 HISTORY — DX: Thyrotoxicosis, unspecified without thyrotoxic crisis or storm: E05.90

## 2022-06-29 LAB — HEMOGLOBIN A1C
Hgb A1c MFr Bld: 6.1 % — ABNORMAL HIGH (ref 4.8–5.6)
Mean Plasma Glucose: 128.37 mg/dL

## 2022-06-29 LAB — SURGICAL PCR SCREEN
MRSA, PCR: NEGATIVE
Staphylococcus aureus: NEGATIVE

## 2022-06-29 LAB — TYPE AND SCREEN
ABO/RH(D): A POS
Antibody Screen: NEGATIVE

## 2022-06-29 LAB — GLUCOSE, CAPILLARY: Glucose-Capillary: 102 mg/dL — ABNORMAL HIGH (ref 70–99)

## 2022-07-10 ENCOUNTER — Encounter (HOSPITAL_COMMUNITY): Payer: Self-pay | Admitting: Orthopedic Surgery

## 2022-07-10 ENCOUNTER — Observation Stay (HOSPITAL_COMMUNITY)
Admission: RE | Admit: 2022-07-10 | Discharge: 2022-07-12 | Disposition: A | Discharge status: Home / Self Care | Payer: Medicare HMO | Attending: Orthopedic Surgery | Admitting: Orthopedic Surgery

## 2022-07-10 ENCOUNTER — Other Ambulatory Visit: Payer: Self-pay

## 2022-07-10 ENCOUNTER — Encounter (HOSPITAL_COMMUNITY)
Admission: RE | Disposition: A | Discharge status: Home / Self Care | Payer: Self-pay | Source: Home / Self Care | Attending: Orthopedic Surgery

## 2022-07-10 ENCOUNTER — Ambulatory Visit (HOSPITAL_BASED_OUTPATIENT_CLINIC_OR_DEPARTMENT_OTHER): Payer: Medicare HMO | Admitting: Certified Registered Nurse Anesthetist

## 2022-07-10 ENCOUNTER — Ambulatory Visit (HOSPITAL_COMMUNITY): Payer: Medicare HMO | Admitting: Physician Assistant

## 2022-07-10 DIAGNOSIS — Z79899 Other long term (current) drug therapy: Secondary | ICD-10-CM | POA: Insufficient documentation

## 2022-07-10 DIAGNOSIS — I129 Hypertensive chronic kidney disease with stage 1 through stage 4 chronic kidney disease, or unspecified chronic kidney disease: Secondary | ICD-10-CM | POA: Diagnosis not present

## 2022-07-10 DIAGNOSIS — Z96652 Presence of left artificial knee joint: Secondary | ICD-10-CM

## 2022-07-10 DIAGNOSIS — Z96651 Presence of right artificial knee joint: Secondary | ICD-10-CM | POA: Insufficient documentation

## 2022-07-10 DIAGNOSIS — Z96641 Presence of right artificial hip joint: Secondary | ICD-10-CM | POA: Diagnosis not present

## 2022-07-10 DIAGNOSIS — N189 Chronic kidney disease, unspecified: Secondary | ICD-10-CM | POA: Diagnosis not present

## 2022-07-10 DIAGNOSIS — J449 Chronic obstructive pulmonary disease, unspecified: Secondary | ICD-10-CM | POA: Insufficient documentation

## 2022-07-10 DIAGNOSIS — M1712 Unilateral primary osteoarthritis, left knee: Principal | ICD-10-CM

## 2022-07-10 HISTORY — PX: TOTAL KNEE ARTHROPLASTY: SHX125

## 2022-07-10 LAB — GLUCOSE, CAPILLARY: Glucose-Capillary: 113 mg/dL — ABNORMAL HIGH (ref 70–99)

## 2022-07-10 SURGERY — ARTHROPLASTY, KNEE, TOTAL
Anesthesia: Spinal | Site: Knee | Laterality: Left

## 2022-07-10 MED ORDER — SODIUM CHLORIDE 0.9 % IV SOLN: INTRAVENOUS | Status: DC

## 2022-07-10 MED ORDER — PHENOL 1.4 % MT LIQD: 1.0000 | OROMUCOSAL | Status: DC | PRN

## 2022-07-10 MED ORDER — IPRATROPIUM BROMIDE 0.02 % IN SOLN: 0.5000 mg | Freq: Four times a day (QID) | RESPIRATORY_TRACT | Status: DC | PRN

## 2022-07-10 MED ORDER — ONDANSETRON HCL 4 MG/2ML IJ SOLN
INTRAMUSCULAR | Status: DC | PRN
  Administered 2022-07-10: 4 mg via INTRAVENOUS

## 2022-07-10 MED ORDER — DEXAMETHASONE SODIUM PHOSPHATE 10 MG/ML IJ SOLN
10.0000 mg | Freq: Once | INTRAMUSCULAR | Status: AC
  Administered 2022-07-11: 10 mg via INTRAVENOUS
  Filled 2022-07-10: qty 1

## 2022-07-10 MED ORDER — KETOROLAC TROMETHAMINE 30 MG/ML IJ SOLN
INTRAMUSCULAR | Status: DC | PRN
  Administered 2022-07-10: 30 mg

## 2022-07-10 MED ORDER — POLYETHYLENE GLYCOL 3350 17 G PO PACK
17.0000 g | PACK | Freq: Every day | ORAL | Status: DC
  Administered 2022-07-10: 17 g via ORAL
  Filled 2022-07-10 (×2): qty 1

## 2022-07-10 MED ORDER — LACTATED RINGERS IV SOLN: INTRAVENOUS | Status: DC

## 2022-07-10 MED ORDER — CEFAZOLIN SODIUM-DEXTROSE 2-4 GM/100ML-% IV SOLN
2.0000 g | INTRAVENOUS | Status: AC
  Administered 2022-07-10: 2 g via INTRAVENOUS
  Filled 2022-07-10: qty 100

## 2022-07-10 MED ORDER — BUPIVACAINE IN DEXTROSE 0.75-8.25 % IT SOLN
INTRATHECAL | Status: DC | PRN
  Administered 2022-07-10: 1.6 mL via INTRATHECAL

## 2022-07-10 MED ORDER — SODIUM CHLORIDE (PF) 0.9 % IJ SOLN
INTRAMUSCULAR | Status: DC | PRN
  Administered 2022-07-10: 30 mL

## 2022-07-10 MED ORDER — OXYCODONE HCL 5 MG PO TABS: 5.0000 mg | ORAL_TABLET | Freq: Once | ORAL | Status: DC | PRN

## 2022-07-10 MED ORDER — SODIUM CHLORIDE (PF) 0.9 % IJ SOLN
INTRAMUSCULAR | Status: AC
  Filled 2022-07-10: qty 50

## 2022-07-10 MED ORDER — AMLODIPINE BESYLATE 5 MG PO TABS
2.5000 mg | ORAL_TABLET | Freq: Every day | ORAL | Status: DC
  Administered 2022-07-11 – 2022-07-12 (×2): 2.5 mg via ORAL
  Filled 2022-07-10 (×3): qty 1

## 2022-07-10 MED ORDER — BUPIVACAINE-EPINEPHRINE (PF) 0.25% -1:200000 IJ SOLN
INTRAMUSCULAR | Status: DC | PRN
  Administered 2022-07-10: 30 mL via PERINEURAL

## 2022-07-10 MED ORDER — SODIUM CHLORIDE 0.9 % IR SOLN
Status: DC | PRN
  Administered 2022-07-10: 1000 mL

## 2022-07-10 MED ORDER — CEFAZOLIN SODIUM-DEXTROSE 2-4 GM/100ML-% IV SOLN
2.0000 g | Freq: Four times a day (QID) | INTRAVENOUS | Status: AC
  Administered 2022-07-10 – 2022-07-11 (×2): 2 g via INTRAVENOUS
  Filled 2022-07-10 (×2): qty 100

## 2022-07-10 MED ORDER — STERILE WATER FOR IRRIGATION IR SOLN
Status: DC | PRN
  Administered 2022-07-10: 2000 mL

## 2022-07-10 MED ORDER — BISACODYL 10 MG RE SUPP: 10.0000 mg | Freq: Every day | RECTAL | Status: DC | PRN

## 2022-07-10 MED ORDER — OXYCODONE HCL 5 MG PO TABS
5.0000 mg | ORAL_TABLET | ORAL | Status: DC | PRN
  Administered 2022-07-10: 10 mg via ORAL
  Filled 2022-07-10: qty 2

## 2022-07-10 MED ORDER — ACETAMINOPHEN 325 MG PO TABS
325.0000 mg | ORAL_TABLET | Freq: Four times a day (QID) | ORAL | Status: DC | PRN
  Administered 2022-07-12: 650 mg via ORAL
  Filled 2022-07-10: qty 2

## 2022-07-10 MED ORDER — OXYBUTYNIN CHLORIDE 5 MG PO TABS
5.0000 mg | ORAL_TABLET | Freq: Every day | ORAL | Status: DC
  Administered 2022-07-10 – 2022-07-11 (×2): 5 mg via ORAL
  Filled 2022-07-10 (×2): qty 1

## 2022-07-10 MED ORDER — FOLIC ACID 1 MG PO TABS
1.0000 mg | ORAL_TABLET | Freq: Every morning | ORAL | Status: DC
  Administered 2022-07-11 – 2022-07-12 (×2): 1 mg via ORAL
  Filled 2022-07-10 (×2): qty 1

## 2022-07-10 MED ORDER — BUPIVACAINE-EPINEPHRINE (PF) 0.25% -1:200000 IJ SOLN
INTRAMUSCULAR | Status: AC
  Filled 2022-07-10: qty 30

## 2022-07-10 MED ORDER — CYCLOBENZAPRINE HCL 10 MG PO TABS
10.0000 mg | ORAL_TABLET | Freq: Three times a day (TID) | ORAL | Status: DC | PRN
  Administered 2022-07-11 – 2022-07-12 (×3): 10 mg via ORAL
  Filled 2022-07-10 (×3): qty 1

## 2022-07-10 MED ORDER — ONDANSETRON HCL 4 MG/2ML IJ SOLN
INTRAMUSCULAR | Status: AC
  Filled 2022-07-10: qty 2

## 2022-07-10 MED ORDER — TRANEXAMIC ACID-NACL 1000-0.7 MG/100ML-% IV SOLN
1000.0000 mg | Freq: Once | INTRAVENOUS | Status: AC
  Administered 2022-07-10: 1000 mg via INTRAVENOUS
  Filled 2022-07-10: qty 100

## 2022-07-10 MED ORDER — METOCLOPRAMIDE HCL 5 MG/ML IJ SOLN: 5.0000 mg | Freq: Three times a day (TID) | INTRAMUSCULAR | Status: DC | PRN

## 2022-07-10 MED ORDER — MENTHOL 3 MG MT LOZG: 1.0000 | LOZENGE | OROMUCOSAL | Status: DC | PRN

## 2022-07-10 MED ORDER — APIXABAN 5 MG PO TABS
5.0000 mg | ORAL_TABLET | Freq: Two times a day (BID) | ORAL | Status: DC
  Administered 2022-07-11 – 2022-07-12 (×3): 5 mg via ORAL
  Filled 2022-07-10 (×3): qty 1

## 2022-07-10 MED ORDER — MONTELUKAST SODIUM 10 MG PO TABS
10.0000 mg | ORAL_TABLET | Freq: Every morning | ORAL | Status: DC
  Administered 2022-07-11 – 2022-07-12 (×2): 10 mg via ORAL
  Filled 2022-07-10 (×2): qty 1

## 2022-07-10 MED ORDER — FENTANYL CITRATE PF 50 MCG/ML IJ SOSY: 25.0000 ug | PREFILLED_SYRINGE | INTRAMUSCULAR | Status: DC | PRN

## 2022-07-10 MED ORDER — FENTANYL CITRATE PF 50 MCG/ML IJ SOSY
50.0000 ug | PREFILLED_SYRINGE | Freq: Once | INTRAMUSCULAR | Status: AC
  Administered 2022-07-10: 50 ug via INTRAVENOUS
  Filled 2022-07-10: qty 2

## 2022-07-10 MED ORDER — POVIDONE-IODINE 10 % EX SWAB
2.0000 | Freq: Once | CUTANEOUS | Status: AC
  Administered 2022-07-10: 2 via TOPICAL

## 2022-07-10 MED ORDER — IPRATROPIUM BROMIDE HFA 17 MCG/ACT IN AERS: 2.0000 | INHALATION_SPRAY | Freq: Four times a day (QID) | RESPIRATORY_TRACT | Status: DC | PRN

## 2022-07-10 MED ORDER — DEXAMETHASONE SODIUM PHOSPHATE 10 MG/ML IJ SOLN
8.0000 mg | Freq: Once | INTRAMUSCULAR | Status: AC
  Administered 2022-07-10: 10 mg via INTRAVENOUS

## 2022-07-10 MED ORDER — 0.9 % SODIUM CHLORIDE (POUR BTL) OPTIME
TOPICAL | Status: DC | PRN
  Administered 2022-07-10: 1000 mL

## 2022-07-10 MED ORDER — SENNA 8.6 MG PO TABS
2.0000 | ORAL_TABLET | Freq: Every day | ORAL | Status: DC
  Administered 2022-07-10 – 2022-07-11 (×2): 17.2 mg via ORAL
  Filled 2022-07-10 (×2): qty 2

## 2022-07-10 MED ORDER — MOMETASONE FURO-FORMOTEROL FUM 200-5 MCG/ACT IN AERO
2.0000 | INHALATION_SPRAY | Freq: Two times a day (BID) | RESPIRATORY_TRACT | Status: DC
  Administered 2022-07-10 – 2022-07-12 (×4): 2 via RESPIRATORY_TRACT
  Filled 2022-07-10: qty 8.8

## 2022-07-10 MED ORDER — POLYETHYL GLYCOL-PROPYL GLYCOL 0.4-0.3 % OP SOLN: 1.0000 [drp] | Freq: Every morning | OPHTHALMIC | Status: DC

## 2022-07-10 MED ORDER — TRANEXAMIC ACID-NACL 1000-0.7 MG/100ML-% IV SOLN
1000.0000 mg | INTRAVENOUS | Status: DC
  Filled 2022-07-10: qty 100

## 2022-07-10 MED ORDER — PHENYLEPHRINE HCL-NACL 20-0.9 MG/250ML-% IV SOLN
INTRAVENOUS | Status: DC | PRN
  Administered 2022-07-10: 35 ug/min via INTRAVENOUS
  Administered 2022-07-10: 50 ug/min via INTRAVENOUS

## 2022-07-10 MED ORDER — DEXAMETHASONE SODIUM PHOSPHATE 10 MG/ML IJ SOLN
INTRAMUSCULAR | Status: AC
  Filled 2022-07-10: qty 1

## 2022-07-10 MED ORDER — KETOROLAC TROMETHAMINE 30 MG/ML IJ SOLN
INTRAMUSCULAR | Status: AC
  Filled 2022-07-10: qty 1

## 2022-07-10 MED ORDER — PREGABALIN 75 MG PO CAPS
150.0000 mg | ORAL_CAPSULE | Freq: Every day | ORAL | Status: DC
  Administered 2022-07-10 – 2022-07-11 (×2): 150 mg via ORAL
  Filled 2022-07-10 (×2): qty 2

## 2022-07-10 MED ORDER — PANTOPRAZOLE SODIUM 40 MG PO TBEC
40.0000 mg | DELAYED_RELEASE_TABLET | Freq: Every day | ORAL | Status: DC
  Administered 2022-07-11 – 2022-07-12 (×2): 40 mg via ORAL
  Filled 2022-07-10 (×2): qty 1

## 2022-07-10 MED ORDER — PROPOFOL 500 MG/50ML IV EMUL
INTRAVENOUS | Status: DC | PRN
  Administered 2022-07-10: 75 ug/kg/min via INTRAVENOUS

## 2022-07-10 MED ORDER — ACETAMINOPHEN 500 MG PO TABS
1000.0000 mg | ORAL_TABLET | Freq: Once | ORAL | Status: AC
  Administered 2022-07-10: 1000 mg via ORAL
  Filled 2022-07-10: qty 2

## 2022-07-10 MED ORDER — LORATADINE 10 MG PO TABS
10.0000 mg | ORAL_TABLET | Freq: Every day | ORAL | Status: DC
  Administered 2022-07-11 – 2022-07-12 (×2): 10 mg via ORAL
  Filled 2022-07-10 (×2): qty 1

## 2022-07-10 MED ORDER — FAMOTIDINE 20 MG PO TABS
40.0000 mg | ORAL_TABLET | Freq: Every day | ORAL | Status: DC
  Administered 2022-07-10 – 2022-07-11 (×2): 40 mg via ORAL
  Filled 2022-07-10 (×2): qty 2

## 2022-07-10 MED ORDER — MIDAZOLAM HCL 2 MG/2ML IJ SOLN
1.0000 mg | Freq: Once | INTRAMUSCULAR | Status: AC
  Administered 2022-07-10: 1 mg via INTRAVENOUS
  Filled 2022-07-10: qty 2

## 2022-07-10 MED ORDER — METOCLOPRAMIDE HCL 5 MG PO TABS: 5.0000 mg | ORAL_TABLET | Freq: Three times a day (TID) | ORAL | Status: DC | PRN

## 2022-07-10 MED ORDER — PROPOFOL 10 MG/ML IV BOLUS
INTRAVENOUS | Status: DC | PRN
  Administered 2022-07-10: 10 mg via INTRAVENOUS

## 2022-07-10 MED ORDER — LINACLOTIDE 72 MCG PO CAPS
72.0000 ug | ORAL_CAPSULE | Freq: Every day | ORAL | Status: DC
  Administered 2022-07-11 – 2022-07-12 (×2): 72 ug via ORAL
  Filled 2022-07-10 (×3): qty 1

## 2022-07-10 MED ORDER — ONDANSETRON HCL 4 MG PO TABS: 4.0000 mg | ORAL_TABLET | Freq: Four times a day (QID) | ORAL | Status: DC | PRN

## 2022-07-10 MED ORDER — ONDANSETRON HCL 4 MG/2ML IJ SOLN: 4.0000 mg | Freq: Four times a day (QID) | INTRAMUSCULAR | Status: DC | PRN

## 2022-07-10 MED ORDER — DIPHENHYDRAMINE HCL 12.5 MG/5ML PO ELIX: 12.5000 mg | ORAL_SOLUTION | ORAL | Status: DC | PRN

## 2022-07-10 MED ORDER — ATORVASTATIN CALCIUM 40 MG PO TABS
40.0000 mg | ORAL_TABLET | Freq: Every morning | ORAL | Status: DC
  Administered 2022-07-11 – 2022-07-12 (×2): 40 mg via ORAL
  Filled 2022-07-10 (×2): qty 1

## 2022-07-10 MED ORDER — VENLAFAXINE HCL ER 150 MG PO CP24
150.0000 mg | ORAL_CAPSULE | Freq: Every day | ORAL | Status: DC
  Administered 2022-07-10 – 2022-07-12 (×3): 150 mg via ORAL
  Filled 2022-07-10 (×3): qty 1

## 2022-07-10 MED ORDER — HYDROMORPHONE HCL 1 MG/ML IJ SOLN
0.5000 mg | INTRAMUSCULAR | Status: DC | PRN
  Administered 2022-07-10 – 2022-07-11 (×2): 1 mg via INTRAVENOUS
  Filled 2022-07-10 (×2): qty 1

## 2022-07-10 MED ORDER — ACETAMINOPHEN 500 MG PO TABS
1000.0000 mg | ORAL_TABLET | Freq: Four times a day (QID) | ORAL | Status: DC
  Administered 2022-07-10 – 2022-07-12 (×7): 1000 mg via ORAL
  Filled 2022-07-10 (×8): qty 2

## 2022-07-10 MED ORDER — OXYCODONE HCL 5 MG PO TABS
10.0000 mg | ORAL_TABLET | ORAL | Status: DC | PRN
  Administered 2022-07-11: 10 mg via ORAL
  Administered 2022-07-11 – 2022-07-12 (×5): 15 mg via ORAL
  Filled 2022-07-10 (×2): qty 3
  Filled 2022-07-10: qty 2
  Filled 2022-07-10 (×3): qty 3

## 2022-07-10 MED ORDER — POLYVINYL ALCOHOL 1.4 % OP SOLN
1.0000 [drp] | Freq: Every day | OPHTHALMIC | Status: DC
  Administered 2022-07-11 – 2022-07-12 (×2): 1 [drp] via OPHTHALMIC
  Filled 2022-07-10: qty 15

## 2022-07-10 MED ORDER — AMLODIPINE BESYLATE 2.5 MG PO TABS
2.5000 mg | ORAL_TABLET | ORAL | Status: AC
  Administered 2022-07-10: 2.5 mg via ORAL
  Filled 2022-07-10: qty 1

## 2022-07-10 MED ORDER — OXYCODONE HCL 5 MG/5ML PO SOLN: 5.0000 mg | Freq: Once | ORAL | Status: DC | PRN

## 2022-07-10 MED ORDER — FERROUS SULFATE 325 (65 FE) MG PO TABS
325.0000 mg | ORAL_TABLET | Freq: Three times a day (TID) | ORAL | Status: DC
  Administered 2022-07-11 – 2022-07-12 (×4): 325 mg via ORAL
  Filled 2022-07-10 (×4): qty 1

## 2022-07-10 SURGICAL SUPPLY — 57 items
ADH SKN CLS APL DERMABOND .7 (GAUZE/BANDAGES/DRESSINGS) ×1
ADH SKN CLS LQ APL DERMABOND (GAUZE/BANDAGES/DRESSINGS) ×1
ATTUNE MED ANAT PAT 32 KNEE (Knees) IMPLANT
ATTUNE PSFEM LTSZ4 NARCEM KNEE (Femur) IMPLANT
ATTUNE PSRP INSR SZ4 6 KNEE (Insert) IMPLANT
BAG COUNTER SPONGE SURGICOUNT (BAG) IMPLANT
BAG SPEC THK2 15X12 ZIP CLS (MISCELLANEOUS)
BAG SPNG CNTER NS LX DISP (BAG)
BAG ZIPLOCK 12X15 (MISCELLANEOUS) IMPLANT
BASE TIBIAL ROT PLAT SZ 3 KNEE (Knees) IMPLANT
BLADE SAW SGTL 11.0X1.19X90.0M (BLADE) IMPLANT
BLADE SAW SGTL 13.0X1.19X90.0M (BLADE) ×2 IMPLANT
BNDG CMPR MED 10X6 ELC LF (GAUZE/BANDAGES/DRESSINGS) ×1
BNDG ELASTIC 6X10 VLCR STRL LF (GAUZE/BANDAGES/DRESSINGS) IMPLANT
BNDG ELASTIC 6X5.8 VLCR STR LF (GAUZE/BANDAGES/DRESSINGS) ×2 IMPLANT
BOWL SMART MIX CTS (DISPOSABLE) ×2 IMPLANT
BSPLAT TIB 3 CMNT ROT PLAT STR (Knees) ×1 IMPLANT
CEMENT HV SMART SET (Cement) IMPLANT
CUFF TOURN SGL QUICK 34 (TOURNIQUET CUFF) ×1
CUFF TRNQT CYL 34X4.125X (TOURNIQUET CUFF) ×2 IMPLANT
DERMABOND ADVANCED .7 DNX12 (GAUZE/BANDAGES/DRESSINGS) ×2 IMPLANT
DERMABOND ADVANCED .7 DNX6 (GAUZE/BANDAGES/DRESSINGS) IMPLANT
DRAPE U-SHAPE 47X51 STRL (DRAPES) ×2 IMPLANT
DRESSING AQUACEL AG SP 3.5X10 (GAUZE/BANDAGES/DRESSINGS) ×2 IMPLANT
DRSG AQUACEL AG SP 3.5X10 (GAUZE/BANDAGES/DRESSINGS) ×1
DURAPREP 26ML APPLICATOR (WOUND CARE) ×4 IMPLANT
ELECT REM PT RETURN 15FT ADLT (MISCELLANEOUS) ×2 IMPLANT
GLOVE BIO SURGEON STRL SZ 6 (GLOVE) ×2 IMPLANT
GLOVE BIOGEL PI IND STRL 6.5 (GLOVE) ×2 IMPLANT
GLOVE BIOGEL PI IND STRL 7.5 (GLOVE) ×2 IMPLANT
GLOVE ORTHO TXT STRL SZ7.5 (GLOVE) ×4 IMPLANT
GOWN STRL REUS W/ TWL LRG LVL3 (GOWN DISPOSABLE) ×4 IMPLANT
GOWN STRL REUS W/TWL LRG LVL3 (GOWN DISPOSABLE) ×2
HANDPIECE INTERPULSE COAX TIP (DISPOSABLE) ×1
HOLDER FOLEY CATH W/STRAP (MISCELLANEOUS) IMPLANT
KIT TURNOVER KIT A (KITS) IMPLANT
MANIFOLD NEPTUNE II (INSTRUMENTS) ×2 IMPLANT
NDL SAFETY ECLIP 18X1.5 (MISCELLANEOUS) IMPLANT
NS IRRIG 1000ML POUR BTL (IV SOLUTION) ×2 IMPLANT
PACK TOTAL KNEE CUSTOM (KITS) ×2 IMPLANT
PIN FIX SIGMA LCS THRD HI (PIN) IMPLANT
PROTECTOR NERVE ULNAR (MISCELLANEOUS) ×2 IMPLANT
SET HNDPC FAN SPRY TIP SCT (DISPOSABLE) ×2 IMPLANT
SET PAD KNEE POSITIONER (MISCELLANEOUS) ×2 IMPLANT
SPIKE FLUID TRANSFER (MISCELLANEOUS) ×4 IMPLANT
SUT MNCRL AB 4-0 PS2 18 (SUTURE) ×2 IMPLANT
SUT STRATAFIX PDS+ 0 24IN (SUTURE) ×2 IMPLANT
SUT VIC AB 1 CT1 36 (SUTURE) ×2 IMPLANT
SUT VIC AB 2-0 CT1 27 (SUTURE) ×2
SUT VIC AB 2-0 CT1 TAPERPNT 27 (SUTURE) ×4 IMPLANT
SYR 3ML LL SCALE MARK (SYRINGE) ×2 IMPLANT
TIBIAL BASE ROT PLAT SZ 3 KNEE (Knees) ×1 IMPLANT
TOWEL GREEN STERILE FF (TOWEL DISPOSABLE) ×2 IMPLANT
TRAY FOLEY MTR SLVR 16FR STAT (SET/KITS/TRAYS/PACK) ×2 IMPLANT
TUBE SUCTION HIGH CAP CLEAR NV (SUCTIONS) ×2 IMPLANT
WATER STERILE IRR 1000ML POUR (IV SOLUTION) ×4 IMPLANT
WRAP KNEE MAXI GEL POST OP (GAUZE/BANDAGES/DRESSINGS) ×2 IMPLANT

## 2022-07-10 NOTE — H&P (Signed)
TOTAL KNEE ADMISSION H&P  Patient is being admitted for left total knee arthroplasty.  Subjective:  Chief Complaint:left knee pain.  HPI: Christine Bradley, 56 y.o. female, has a history of pain and functional disability in the left knee due to arthritis and has failed non-surgical conservative treatments for greater than 12 weeks to includeNSAID's and/or analgesics and activity modification.  Onset of symptoms was gradual, starting 2 years ago with gradually worsening course since that time. The patient noted prior procedures on the knee to include  arthroscopy and menisectomy on the left knee(s).  Patient currently rates pain in the left knee(s) at 8 out of 10 with activity. Patient has worsening of pain with activity and weight bearing, pain that interferes with activities of daily living, and pain with passive range of motion.  Patient has evidence of joint space narrowing by imaging studies.  There is no active infection.  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/29/2021   Past Medical History:  Diagnosis Date   Anxiety    Arthritis    Carpal tunnel syndrome    Chronic bronchitis (HCC)    Chronic kidney disease    COPD (chronic obstructive pulmonary disease) (HCC)    Fibromyalgia    GERD (gastroesophageal reflux disease)    Headache    Chronic migraines   Hypertension    Hyperthyroidism    IBS (irritable bowel syndrome)    IC (interstitial cystitis)    Neuromuscular disorder (HCC)    fibromyalgia   Pneumonia 2001   hx of x 2   Pre-diabetes    Psoriatic arthritis ([place])     Past Surgical History:  Procedure Laterality Date   ABDOMINAL HYSTERECTOMY  2004   BREAST EXCISIONAL BIOPSY Left 12/31/2017   foot surgery  1997   RADIOACTIVE SEED GUIDED EXCISIONAL BREAST  BIOPSY Left 12/31/2017   Procedure: LEFT BREAST SEED GUIDED EXCISIONAL BIOPSY;  Surgeon: Coralie Keens, MD;  Location: [facility];  Service: General;  Laterality: Left;   right shoulder repair Right    TOTAL HIP ARTHROPLASTY Right 08/29/2021   Procedure: TOTAL HIP ARTHROPLASTY ANTERIOR APPROACH;  Surgeon: Paralee Cancel, MD;  Location: [facility];  Service: Orthopedics;  Laterality: Right;   TOTAL KNEE ARTHROPLASTY Right 11/07/2021   Procedure: TOTAL KNEE ARTHROPLASTY;  Surgeon: Paralee Cancel, MD;  Location: [facility];  Service: Orthopedics;  Laterality: Right;   WISDOM TOOTH EXTRACTION      No current facility-administered medications for this encounter.   Current Outpatient Medications  Medication Sig Dispense Refill Last Dose   amLODipine (NORVASC) 2.5 MG tablet Take 2.5 mg by mouth daily.      atorvastatin (LIPITOR) 10 MG tablet Take 40 mg by mouth in the morning.      cetirizine (ZYRTEC) 10 MG tablet Take 10 mg by mouth in the morning.      Cholecalciferol (VITAMIN D) 2000 units tablet Take 4,000 Units by mouth daily.      cyclobenzaprine (FLEXERIL) 10 MG tablet Take 20 mg by mouth at bedtime.      diclofenac Sodium (VOLTAREN) 1 % GEL Apply 2 g topically 4 (four) times daily. (Patient taking differently: Apply 2 g topically 4 (four) times daily as needed (joint pain).) 100 g 0    famotidine (PEPCID) 40 MG tablet  Take 40 mg by mouth at bedtime.      ferrous sulfate 325 (65 FE) MG tablet Take 325 mg by mouth in the morning.      fluticasone (FLONASE) 50 MCG/ACT nasal spray Place 2 sprays into both nostrils daily as needed for allergies.      Fluticasone-Salmeterol (ADVAIR) 250-50 MCG/DOSE AEPB Inhale 1 puff into the lungs 2 (two) times daily.      folic acid (FOLVITE) 1 MG tablet Take 1 mg by mouth in the morning.      ipratropium (ATROVENT HFA) 17 MCG/ACT inhaler Inhale 2 puffs into the lungs every 6 (six) hours as needed for wheezing.       linaclotide (LINZESS) 72 MCG capsule Take 72 mcg by  mouth daily before breakfast.      montelukast (SINGULAIR) 10 MG tablet Take 10 mg by mouth in the morning.      Multiple Vitamins-Minerals (HAIR/SKIN/NAILS PO) Take 2 tablets by mouth every morning.       Multiple Vitamins-Minerals (MULTIVITAMIN GUMMIES ADULT PO) Take 2 each by mouth every morning.      oxybutynin (DITROPAN) 5 MG tablet Take 5 mg by mouth at bedtime.      pantoprazole (PROTONIX) 40 MG tablet Take 40 mg by mouth daily before breakfast.       Polyethyl Glycol-Propyl Glycol 0.4-0.3 % SOLN Place 1 drop into both eyes in the morning.      pregabalin (LYRICA) 150 MG capsule Take 150 mg by mouth at bedtime.       Secukinumab (COSENTYX) 150 MG/ML SOSY Inject 150 mg into the skin every 30 (thirty) days.      venlafaxine XR (EFFEXOR-XR) 150 MG 24 hr capsule Take 150 mg by mouth daily.      vitamin C (ASCORBIC ACID) 250 MG tablet Take 500 mg by mouth daily.      Allergies  Allergen Reactions   Xarelto [Rivaroxaban] Other (See Comments)     exacerbated her fibromyalgia.   Albuterol Other (See Comments)    Inhaler only (nebulizer is fine) - reaction: worsened asthma and bronchitis   Hydrocodone-Acetaminophen Nausea And Vomiting    Can not take Name Brand Vicodin - Generic is Ok    Maxalt [Rizatriptan]     Worsened migraines    Penicillins Other (See Comments)    "raw on my bottom" Has patient had a PCN reaction causing immediate rash, facial/tongue/throat swelling, SOB or lightheadedness with hypotension: No Has patient had a PCN reaction causing severe rash involving mucus membranes or skin necrosis: No Has patient had a PCN reaction that required hospitalization No Has patient had a PCN reaction occurring within the last 10 years: No If all of the above answers are "NO", then may proceed with Cephalosporin use. Tolerated ancef 08/29/21     Voltaren [Diclofenac Sodium] Other (See Comments)    Stomach ulcers - in pill form, pt is currently using the topical form    Social  History   Tobacco Use   Smoking status: Never   Smokeless tobacco: Never  Substance Use Topics   Alcohol use: Yes    Alcohol/week: 1.0 standard drink of alcohol    Types: 1 Glasses of wine per week    Family History  Problem Relation Age of Onset   Arthritis Mother    Breast cancer Mother 68     Review of Systems  Constitutional:  Negative for chills and fever.  Respiratory:  Negative for cough and shortness of breath.  Cardiovascular:  Negative for chest pain.  Gastrointestinal:  Negative for nausea and vomiting.  Musculoskeletal:  Positive for arthralgias.     Objective:  Physical Exam Well nourished and well developed. General: Alert and oriented x3, cooperative and pleasant, no acute distress. Head: normocephalic, atraumatic, neck supple. Eyes: EOMI.  Musculoskeletal: Left knee exam: No palpable effusion Tenderness anteriorly No significant flexion contracture Flexion to 120 degrees with crepitation and terminal tightness  Calves soft and nontender. Motor function intact in LE. Strength 5/5 LE bilaterally. Neuro: Distal pulses 2+. Sensation to light touch intact in LE.  Vital signs in last 24 hours:    Labs:   Estimated body mass index is 35.19 kg/m as calculated from the following:   Height as of 06/29/22: '5\' 4"'$  (1.626 m).   Weight as of 06/29/22: 93 kg.   Imaging Review Plain radiographs demonstrate severe degenerative joint disease of the left knee(s). The overall alignment isneutral. The bone quality appears to be adequate for age and reported activity level.      Assessment/Plan:  End stage arthritis, left knee   The patient history, physical examination, clinical judgment of the provider and imaging studies are consistent with end stage degenerative joint disease of the left knee(s) and total knee arthroplasty is deemed medically necessary. The treatment options including medical management, injection therapy arthroscopy and arthroplasty were  discussed at length. The risks and benefits of total knee arthroplasty were presented and reviewed. The risks due to aseptic loosening, infection, stiffness, patella tracking problems, thromboembolic complications and other imponderables were discussed. The patient acknowledged the explanation, agreed to proceed with the plan and consent was signed. Patient is being admitted for inpatient treatment for surgery, pain control, PT, OT, prophylactic antibiotics, VTE prophylaxis, progressive ambulation and ADL's and discharge planning. The patient is planning to be discharged  home.  Therapy Plans: outpatient therapy at [facility] Disposition: Home with mom (patient staying with mom) Planned DVT Prophylaxis: Eliquis 5 mg BID DME needed: none PCP: Heme/Onc: Dr. Alvy Bimler, clearance received TXA: IV Allergies: albuterol, PCN - skin irritation, Vicodin - vomiting Anesthesia Concerns: BMI: 34.5 Last HgbA1c: Not diabetic   Other: - Heme/Onc DVT Plan: " D-dimer is almost normal Her prior ultrasound showed no evidence of persistent DVT I recommend the patient to discontinue Eliquis I gave her a letter to bring to her orthopedic surgeon to clear her for future right knee surgery The patient would still be at high risk for recurrent DVT I recommend the patient to resume taking Eliquis within 24 hours after surgery for minimum of 3 months I plan to see her at the end of 3 months for further assessment "  - Hx of intractable migraines - under care of Neuro - Prior right TKA - doing well  - Oxycodone, tylenol, flexeril    Patient's anticipated LOS is less than 2 midnights, meeting these requirements: - Younger than 63 - Lives within 1 hour of care - Has a competent adult at home to recover with post-op recover - NO history of  - Chronic pain requiring opiods  - Diabetes  - Coronary Artery Disease  - Heart failure  - Heart attack  - Stroke  - DVT/VTE  - Cardiac arrhythmia  - Respiratory  Failure/COPD  - Renal failure  - Anemia  - Advanced Liver disease  Costella Hatcher, PA-C Orthopedic Surgery [facility] [place] [phone]

## 2022-07-10 NOTE — Interval H&P Note (Signed)
History and Physical Interval Note:  07/10/2022 11:55 AM  Christine Bradley  has presented today for surgery, with the diagnosis of Left knee osteoarthritis.  The various methods of treatment have been discussed with the patient and family. After consideration of risks, benefits and other options for treatment, the patient has consented to  Procedure(s): TOTAL KNEE ARTHROPLASTY (Left) as a surgical intervention.  The patient's history has been reviewed, patient examined, no change in status, stable for surgery.  I have reviewed the patient's chart and labs.  Questions were answered to the patient's satisfaction.     [name]

## 2022-07-10 NOTE — Plan of Care (Signed)
  Problem: Activity: Goal: Ability to avoid complications of mobility impairment will improve Outcome: Progressing Goal: Range of joint motion will improve Outcome: Progressing   Problem: Pain Management: Goal: Pain level will decrease with appropriate interventions Outcome: Progressing   Problem: Safety: Goal: Ability to remain free from injury will improve Outcome: Progressing   

## 2022-07-10 NOTE — Anesthesia Postprocedure Evaluation (Signed)
Anesthesia Post Note  Patient: Christine Bradley  Procedure(s) Performed: TOTAL KNEE ARTHROPLASTY (Left: Knee)     Patient location during evaluation: PACU Anesthesia Type: Spinal Level of consciousness: awake and alert Pain management: pain level controlled Vital Signs Assessment: post-procedure vital signs reviewed and stable Respiratory status: spontaneous breathing, nonlabored ventilation and respiratory function stable Cardiovascular status: blood pressure returned to baseline Postop Assessment: no apparent nausea or vomiting, spinal receding, no headache and no backache Anesthetic complications: no   No notable events documented.  Last Vitals:  Vitals:   07/10/22 1530 07/10/22 1545  BP: 107/83 113/75  Pulse: 93 90  Resp: 20 14  Temp:    SpO2: 99% 98%    Last Pain:  Vitals:   07/10/22 1530  TempSrc:   PainSc: 0-No pain                 Marthenia Rolling

## 2022-07-10 NOTE — Anesthesia Preprocedure Evaluation (Addendum)
Anesthesia Evaluation  Patient identified by MRN, date of birth, ID band Patient awake    Reviewed: Allergy & Precautions, NPO status , Patient's Chart, lab work & pertinent test results  History of Anesthesia Complications Negative for: history of anesthetic complications  Airway Mallampati: II  TM Distance: >3 FB Neck ROM: Full    Dental no notable dental hx.    Pulmonary COPD, Patient abstained from smoking.,    Pulmonary exam normal        Cardiovascular hypertension, Pt. on medications Normal cardiovascular exam     Neuro/Psych  Headaches, Anxiety    GI/Hepatic Neg liver ROS, GERD  Medicated and Controlled,  Endo/Other  Hyperthyroidism   Renal/GU Renal InsufficiencyRenal disease  negative genitourinary   Musculoskeletal  (+) Arthritis , Fibromyalgia -  Abdominal   Peds  Hematology negative hematology ROS (+)   Anesthesia Other Findings Day of surgery medications reviewed with patient.  Reproductive/Obstetrics negative OB ROS                            Anesthesia Physical Anesthesia Plan  ASA: 2  Anesthesia Plan: Spinal   Post-op Pain Management: Tylenol PO (pre-op)* and Regional block*   Induction:   PONV Risk Score and Plan: Treatment may vary due to age or medical condition, Ondansetron, Propofol infusion and Dexamethasone  Airway Management Planned: Natural Airway and Simple Face Mask  Additional Equipment: None  Intra-op Plan:   Post-operative Plan:   Informed Consent: I have reviewed the patients History and Physical, chart, labs and discussed the procedure including the risks, benefits and alternatives for the proposed anesthesia with the patient or authorized representative who has indicated his/her understanding and acceptance.       Plan Discussed with: CRNA  Anesthesia Plan Comments:       Anesthesia Quick Evaluation

## 2022-07-10 NOTE — Transfer of Care (Signed)
Immediate Anesthesia Transfer of Care Note  Patient: Christine Bradley  Procedure(s) Performed: TOTAL KNEE ARTHROPLASTY (Left: Knee)  Patient Location: PACU  Anesthesia Type:Spinal  Level of Consciousness: awake, alert  and oriented  Airway & Oxygen Therapy: Patient Spontanous Breathing and Patient connected to face mask oxygen  Post-op Assessment: Report given to RN and Post -op Vital signs reviewed and stable  Post vital signs: Reviewed and stable  Last Vitals:  Vitals Value Taken Time  BP 116/82 07/10/22 1500  Temp    Pulse 96 07/10/22 1500  Resp 14 07/10/22 1500  SpO2 100 % 07/10/22 1500  Vitals shown include unvalidated device data.  Last Pain:  Vitals:   07/10/22 1105  TempSrc: Oral  PainSc: 8       Patients Stated Pain Goal: 4 (38/18/29 9371)  Complications: No notable events documented.

## 2022-07-10 NOTE — Anesthesia Procedure Notes (Signed)
Procedure Name: MAC Date/Time: 07/10/2022 1:11 PM  Performed by: Maxwell Caul, CRNAPre-anesthesia Checklist: Patient identified, Emergency Drugs available, Suction available and Patient being monitored Oxygen Delivery Method: Simple face mask

## 2022-07-10 NOTE — Op Note (Signed)
NAME:  Christine Bradley                      MEDICAL RECORD NO.:  [id number]                             FACILITY:  [facility]      PHYSICIAN:  Pietro Cassis. Alvan Dame, M.D.  DATE OF BIRTH:  [birth year]      DATE OF PROCEDURE:  07/10/2022                                     OPERATIVE REPORT         PREOPERATIVE DIAGNOSIS:  Left knee osteoarthritis.      POSTOPERATIVE DIAGNOSIS:  Left knee osteoarthritis.      FINDINGS:  The patient was noted to have complete loss of cartilage and   bone-on-bone arthritis with associated osteophytes in the patellofemoral and lateral compartments of   the knee with a significant synovitis and associated effusion.  The patient had failed months of conservative treatment including medications, injection therapy, activity modification.     PROCEDURE:  Left total knee replacement.      COMPONENTS USED:  DePuy Attune rotating platform posterior stabilized knee   system, a size 4N femur, 3 tibia, size 6 mm PS AOX insert, and 32 anatomic patellar   button.      SURGEON:  Pietro Cassis. Alvan Dame, M.D.      ASSISTANT:  Costella Hatcher, PA-C.      ANESTHESIA:  Regional and Spinal.      SPECIMENS:  None.      COMPLICATION:  None.      DRAINS:  None.  EBL: <100 cc      TOURNIQUET TIME:  30 min at 225 mmHg     The patient was stable to the recovery room.      INDICATION FOR PROCEDURE:  Christine Bradley is a 56 y.o. female patient of   mine.  The patient had been seen, evaluated, and treated for months conservatively in the   office with medication, activity modification, and injections.  The patient had   radiographic changes of bone-on-bone arthritis with endplate sclerosis and osteophytes noted.  Based on the radiographic changes and failed conservative measures, the patient   decided to proceed with definitive treatment, total knee replacement.  Risks of infection, DVT, component failure, need for revision surgery, neurovascular injury were reviewed in the office setting.  The  postop course was reviewed stressing the efforts to maximize post-operative satisfaction and function.  Consent was obtained for benefit of pain   relief.      PROCEDURE IN DETAIL:  The patient was brought to the operative theater.   Once adequate anesthesia, preoperative antibiotics, 2 gm of Ancef,1 gm of Tranexamic Acid, and 10 mg of Decadron administered, the patient was positioned supine with a left thigh tourniquet placed.  The  left lower extremity was prepped and draped in sterile fashion.  A time-   out was performed identifying the patient, planned procedure, and the appropriate extremity.      The left lower extremity was placed in the [facility] leg holder.  The leg was   exsanguinated, tourniquet elevated to 250 mmHg.  A midline incision was   made followed by median parapatellar arthrotomy.  Following initial   exposure, attention was  first directed to the patella.  Precut   measurement was noted to be 18 mm.  I resected down to 13 mm and used a   32 anatomic patellar button to restore patellar height as well as cover the cut surface.      The lug holes were drilled and a metal shim was placed to protect the   patella from retractors and saw blade during the procedure.      At this point, attention was now directed to the femur.  The femoral   canal was opened with a drill, irrigated to try to prevent fat emboli.  An   intramedullary rod was passed at 3 degrees valgus, 9 mm of bone was   resected off the distal femur.  Following this resection, the tibia was   subluxated anteriorly.  Using the extramedullary guide, 2 mm of bone was resected off   the proximal medial tibia.  We confirmed the gap would be   stable medially and laterally with a size 5 spacer block as well as confirmed that the tibial cut was perpendicular in the coronal plane, checking with an alignment rod.      Once this was done, I sized the femur to be a size 4 in the anterior-   posterior dimension, chose a  narrow component based on medial and   lateral dimension.  The size 4 rotation block was then pinned in   position anterior referenced using the C-clamp to set rotation.  The   anterior, posterior, and  chamfer cuts were made without difficulty nor   notching making certain that I was along the anterior cortex to help   with flexion gap stability.      The final box cut was made off the lateral aspect of distal femur.      At this point, the tibia was sized to be a size 3.  The size 3 tray was   then pinned in position through the medial third of the tubercle,   drilled, and keel punched.  Trial reduction was now carried with a 4 femur,  3 tibia, a size 6 mm PS insert, and the 32 anatomic patella botton.  The knee was brought to full extension with good flexion stability with the patella   tracking through the trochlea without application of pressure.  Given   all these findings the trial components removed.  Final components were   opened and cement was mixed.  The knee was irrigated with normal saline solution and pulse lavage.  The synovial lining was   then injected with 30 cc of 0.25% Marcaine with epinephrine, 1 cc of Toradol and 30 cc of NS for a total of 61 cc.     Final implants were then cemented onto cleaned and dried cut surfaces of bone with the knee brought to extension with a size 6 mm PS trial insert.      Once the cement had fully cured, excess cement was removed   throughout the knee.  I confirmed that I was satisfied with the range of   motion and stability, and the final size 6 mm PS AOX insert was chosen.  It was   placed into the knee.      The tourniquet had been let down at 30 minutes.  No significant   hemostasis was required.  The extensor mechanism was then reapproximated using #1 Vicryl and #1 Stratafix sutures with the knee   in flexion.  The  remaining wound was closed with 2-0 Vicryl and running 4-0 Monocryl.   The knee was cleaned, dried, dressed  sterilely using Dermabond and   Aquacel dressing.  The patient was then   brought to recovery room in stable condition, tolerating the procedure   well.   Please note that Physician Assistant, Costella Hatcher, PA-C was present for the entirety of the case, and was utilized for pre-operative positioning, peri-operative retractor management, general facilitation of the procedure and for primary wound closure at the end of the case.              Pietro Cassis Alvan Dame, M.D.    07/10/2022 12:03 PM

## 2022-07-10 NOTE — Discharge Instructions (Addendum)
INSTRUCTIONS AFTER JOINT REPLACEMENT   Remove items at home which could result in a fall. This includes throw rugs or furniture in walking pathways ICE to the affected joint every three hours while awake for 30 minutes at a time, for at least the first 3-5 days, and then as needed for pain and swelling.  Continue to use ice for pain and swelling. You may notice swelling that will progress down to the foot and ankle.  This is normal after surgery.  Elevate your leg when you are not up walking on it.   Continue to use the breathing machine you got in the hospital (incentive spirometer) which will help keep your temperature down.  It is common for your temperature to cycle up and down following surgery, especially at night when you are not up moving around and exerting yourself.  The breathing machine keeps your lungs expanded and your temperature down.   DIET:  As you were doing prior to hospitalization, we recommend a well-balanced diet.  DRESSING / WOUND CARE / SHOWERING  Keep the surgical dressing until follow up.  The dressing is water proof, so you can shower without any extra covering.  IF THE DRESSING FALLS OFF or the wound gets wet inside, change the dressing with sterile gauze.  Please use good hand washing techniques before changing the dressing.  Do not use any lotions or creams on the incision until instructed by your surgeon.    ACTIVITY  Increase activity slowly as tolerated, but follow the weight bearing instructions below.   No driving for 6 weeks or until further direction given by your physician.  You cannot drive while taking narcotics.  No lifting or carrying greater than 10 lbs. until further directed by your surgeon. Avoid periods of inactivity such as sitting longer than an hour when not asleep. This helps prevent blood clots.  You may return to work once you are authorized by your doctor.     WEIGHT BEARING   Weight bearing as tolerated with assist device (walker, cane,  etc) as directed, use it as long as suggested by your surgeon or therapist, typically at least 4-6 weeks.   EXERCISES  Results after joint replacement surgery are often greatly improved when you follow the exercise, range of motion and muscle strengthening exercises prescribed by your doctor. Safety measures are also important to protect the joint from further injury. Any time any of these exercises cause you to have increased pain or swelling, decrease what you are doing until you are comfortable again and then slowly increase them. If you have problems or questions, call your caregiver or physical therapist for advice.   Rehabilitation is important following a joint replacement. After just a few days of immobilization, the muscles of the leg can become weakened and shrink (atrophy).  These exercises are designed to build up the tone and strength of the thigh and leg muscles and to improve motion. Often times heat used for twenty to thirty minutes before working out will loosen up your tissues and help with improving the range of motion but do not use heat for the first two weeks following surgery (sometimes heat can increase post-operative swelling).   These exercises can be done on a training (exercise) mat, on the floor, on a table or on a bed. Use whatever works the best and is most comfortable for you.    Use music or television while you are exercising so that the exercises are a pleasant break in your   day. This will make your life better with the exercises acting as a break in your routine that you can look forward to.   Perform all exercises about fifteen times, three times per day or as directed.  You should exercise both the operative leg and the other leg as well.  Exercises include:   Quad Sets - Tighten up the muscle on the front of the thigh (Quad) and hold for 5-10 seconds.   Straight Leg Raises - With your knee straight (if you were given a brace, keep it on), lift the leg to 60  degrees, hold for 3 seconds, and slowly lower the leg.  Perform this exercise against resistance later as your leg gets stronger.  Leg Slides: Lying on your back, slowly slide your foot toward your buttocks, bending your knee up off the floor (only go as far as is comfortable). Then slowly slide your foot back down until your leg is flat on the floor again.  Angel Wings: Lying on your back spread your legs to the side as far apart as you can without causing discomfort.  Hamstring Strength:  Lying on your back, push your heel against the floor with your leg straight by tightening up the muscles of your buttocks.  Repeat, but this time bend your knee to a comfortable angle, and push your heel against the floor.  You may put a pillow under the heel to make it more comfortable if necessary.   A rehabilitation program following joint replacement surgery can speed recovery and prevent re-injury in the future due to weakened muscles. Contact your doctor or a physical therapist for more information on knee rehabilitation.    CONSTIPATION  Constipation is defined medically as fewer than three stools per week and severe constipation as less than one stool per week.  Even if you have a regular bowel pattern at home, your normal regimen is likely to be disrupted due to multiple reasons following surgery.  Combination of anesthesia, postoperative narcotics, change in appetite and fluid intake all can affect your bowels.   YOU MUST use at least one of the following options; they are listed in order of increasing strength to get the job done.  They are all available over the counter, and you may need to use some, POSSIBLY even all of these options:    Drink plenty of fluids (prune juice may be helpful) and high fiber foods Colace 100 mg by mouth twice a day  Senokot for constipation as directed and as needed Dulcolax (bisacodyl), take with full glass of water  Miralax (polyethylene glycol) once or twice a day as  needed.  If you have tried all these things and are unable to have a bowel movement in the first 3-4 days after surgery call either your surgeon or your primary doctor.    If you experience loose stools or diarrhea, hold the medications until you stool forms back up.  If your symptoms do not get better within 1 week or if they get worse, check with your doctor.  If you experience "the worst abdominal pain ever" or develop nausea or vomiting, please contact the office immediately for further recommendations for treatment.   ITCHING:  If you experience itching with your medications, try taking only a single pain pill, or even half a pain pill at a time.  You can also use Benadryl over the counter for itching or also to help with sleep.   TED HOSE STOCKINGS:  Use stockings on both   legs until for at least 2 weeks or as directed by physician office. They may be removed at night for sleeping.  MEDICATIONS:  See your medication summary on the "After Visit Summary" that nursing will review with you.  You may have some home medications which will be placed on hold until you complete the course of blood thinner medication.  It is important for you to complete the blood thinner medication as prescribed.  PRECAUTIONS:  If you experience chest pain or shortness of breath - call 911 immediately for transfer to the hospital emergency department.   If you develop a fever greater that 101 F, purulent drainage from wound, increased redness or drainage from wound, foul odor from the wound/dressing, or calf pain - CONTACT YOUR SURGEON.                                                   FOLLOW-UP APPOINTMENTS:  If you do not already have a post-op appointment, please call the office for an appointment to be seen by your surgeon.  Guidelines for how soon to be seen are listed in your "After Visit Summary", but are typically between 1-4 weeks after surgery.  OTHER INSTRUCTIONS:   Knee Replacement:  Do not place pillow  under knee, focus on keeping the knee straight while resting. CPM instructions: 0-90 degrees, 2 hours in the morning, 2 hours in the afternoon, and 2 hours in the evening. Place foam block, curve side up under heel at all times except when in CPM or when walking.  DO NOT modify, tear, cut, or change the foam block in any way.  POST-OPERATIVE OPIOID TAPER INSTRUCTIONS: It is important to wean off of your opioid medication as soon as possible. If you do not need pain medication after your surgery it is ok to stop day one. Opioids include: Codeine, Hydrocodone(Norco, Vicodin), Oxycodone(Percocet, oxycontin) and hydromorphone amongst others.  Long term and even short term use of opiods can cause: Increased pain response Dependence Constipation Depression Respiratory depression And more.  Withdrawal symptoms can include Flu like symptoms Nausea, vomiting And more Techniques to manage these symptoms Hydrate well Eat regular healthy meals Stay active Use relaxation techniques(deep breathing, meditating, yoga) Do Not substitute Alcohol to help with tapering If you have been on opioids for less than two weeks and do not have pain than it is ok to stop all together.  Plan to wean off of opioids This plan should start within one week post op of your joint replacement. Maintain the same interval or time between taking each dose and first decrease the dose.  Cut the total daily intake of opioids by one tablet each day Next start to increase the time between doses. The last dose that should be eliminated is the evening dose.   MAKE SURE YOU:  Understand these instructions.  Get help right away if you are not doing well or get worse.    Thank you for letting us be a part of your medical care team.  It is a privilege we respect greatly.  We hope these instructions will help you stay on track for a fast and full recovery!   Information on my medicine - ELIQUIS (apixaban)   Why was Eliquis  prescribed for you? Eliquis was prescribed for you to reduce the risk of blood clots forming after orthopedic   surgery.    What do You need to know about Eliquis? Take your Eliquis TWICE DAILY - one tablet in the morning and one tablet in the evening with or without food.  It would be best to take the dose about the same time each day.  If you have difficulty swallowing the tablet whole please discuss with your pharmacist how to take the medication safely.  Take Eliquis exactly as prescribed by your doctor and DO NOT stop taking Eliquis without talking to the doctor who prescribed the medication.  Stopping without other medication to take the place of Eliquis may increase your risk of developing a clot.  After discharge, you should have regular check-up appointments with your healthcare provider that is prescribing your Eliquis.  What do you do if you miss a dose? If a dose of ELIQUIS is not taken at the scheduled time, take it as soon as possible on the same day and twice-daily administration should be resumed.  The dose should not be doubled to make up for a missed dose.  Do not take more than one tablet of ELIQUIS at the same time.  Important Safety Information A possible side effect of Eliquis is bleeding. You should call your healthcare provider right away if you experience any of the following: Bleeding from an injury or your nose that does not stop. Unusual colored urine (red or dark brown) or unusual colored stools (red or black). Unusual bruising for unknown reasons. A serious fall or if you hit your head (even if there is no bleeding).  Some medicines may interact with Eliquis and might increase your risk of bleeding or clotting while on Eliquis. To help avoid this, consult your healthcare provider or pharmacist prior to using any new prescription or non-prescription medications, including herbals, vitamins, non-steroidal anti-inflammatory drugs (NSAIDs) and  supplements.  This website has more information on Eliquis (apixaban): http://www.eliquis.com/eliquis/home    

## 2022-07-10 NOTE — Anesthesia Procedure Notes (Signed)
Anesthesia Regional Block: Adductor canal block   Pre-Anesthetic Checklist: , timeout performed,  Correct Patient, Correct Site, Correct Laterality,  Correct Procedure, Correct Position, site marked,  Risks and benefits discussed,  Pre-op evaluation,  At surgeon's request and post-op pain management  Laterality: Left  Prep: Maximum Sterile Barrier Precautions used, chloraprep       Needles:  Injection technique: Single-shot  Needle Type: Echogenic Stimulator Needle     Needle Length: 9cm  Needle Gauge: 22     Additional Needles:   Procedures:,,,, ultrasound used (permanent image in chart),,    Narrative:  Start time: 07/10/2022 1:00 PM End time: 07/10/2022 1:03 PM Injection made incrementally with aspirations every 5 mL.  Performed by: Personally  Anesthesiologist: Brennan Bailey, MD  Additional Notes: Risks, benefits, and alternative discussed. Patient gave consent for procedure. Patient prepped and draped in sterile fashion. Sedation administered, patient remains easily responsive to voice. Relevant anatomy identified with ultrasound guidance. Local anesthetic given in 5cc increments with no signs or symptoms of intravascular injection. No pain or paraesthesias with injection. Patient monitored throughout procedure with signs of LAST or immediate complications. Tolerated well. Ultrasound image placed in chart.  Tawny Asal, MD

## 2022-07-10 NOTE — Anesthesia Procedure Notes (Signed)
Spinal  Patient location during procedure: OR End time: 07/10/2022 1:20 PM Reason for block: surgical anesthesia Staffing Performed: resident/CRNA  Anesthesiologist: Brennan Bailey, MD Resident/CRNA: Maxwell Caul, CRNA Performed by: Maxwell Caul, CRNA Authorized by: Brennan Bailey, MD   Preanesthetic Checklist Completed: patient identified, IV checked, site marked, risks and benefits discussed, surgical consent, monitors and equipment checked, pre-op evaluation and timeout performed Spinal Block Patient position: sitting Prep: DuraPrep Patient monitoring: heart rate, cardiac monitor, continuous pulse ox and blood pressure Approach: midline Location: L3-4 Injection technique: single-shot Needle Needle type: Pencan  Needle gauge: 24 G Needle length: 10 cm Assessment Sensory level: T4 Events: CSF return Additional Notes IV functioning, monitors applied to pt. Expiration date of kit checked and confirmed to be in date. Sterile prep and drape, hand hygiene and sterile gloves used. Pt was positioned and spine was prepped in sterile fashion. Skin was anesthetized with lidocaine. Free flow of clear CSF obtained prior to injecting local anesthetic into CSF x 1 attempt. Spinal needle aspirated freely following injection. Needle was carefully withdrawn, and pt tolerated procedure well. Loss of motor and sensory on exam post injection. Dr Daiva Huge at bedside for entire placement.

## 2022-07-11 ENCOUNTER — Encounter (HOSPITAL_COMMUNITY): Payer: Self-pay | Admitting: Orthopedic Surgery

## 2022-07-11 DIAGNOSIS — M1712 Unilateral primary osteoarthritis, left knee: Secondary | ICD-10-CM | POA: Diagnosis not present

## 2022-07-11 LAB — CBC
HCT: 37.4 % (ref 36.0–46.0)
Hemoglobin: 11 g/dL — ABNORMAL LOW (ref 12.0–15.0)
MCH: 22.4 pg — ABNORMAL LOW (ref 26.0–34.0)
MCHC: 29.4 g/dL — ABNORMAL LOW (ref 30.0–36.0)
MCV: 76.3 fL — ABNORMAL LOW (ref 80.0–100.0)
Platelets: 261 10*3/uL (ref 150–400)
RBC: 4.9 MIL/uL (ref 3.87–5.11)
RDW: 16.6 % — ABNORMAL HIGH (ref 11.5–15.5)
WBC: 8.7 10*3/uL (ref 4.0–10.5)
nRBC: 0 % (ref 0.0–0.2)

## 2022-07-11 LAB — BASIC METABOLIC PANEL
Anion gap: 8 (ref 5–15)
BUN: 11 mg/dL (ref 6–20)
CO2: 22 mmol/L (ref 22–32)
Calcium: 8.4 mg/dL — ABNORMAL LOW (ref 8.9–10.3)
Chloride: 109 mmol/L (ref 98–111)
Creatinine, Ser: 0.81 mg/dL (ref 0.44–1.00)
GFR, Estimated: >60 mL/min (ref >60)
Glucose, Bld: 220 mg/dL — ABNORMAL HIGH (ref 70–99)
Potassium: 4.2 mmol/L (ref 3.5–5.1)
Sodium: 139 mmol/L (ref 135–145)

## 2022-07-11 MED ORDER — CYCLOBENZAPRINE HCL 10 MG PO TABS: 10.0000 mg | ORAL_TABLET | Freq: Three times a day (TID) | ORAL | 2 refills | Status: DC | PRN

## 2022-07-11 MED ORDER — ACETAMINOPHEN 500 MG PO TABS: 1000.0000 mg | ORAL_TABLET | Freq: Four times a day (QID) | ORAL | 0 refills | Status: DC

## 2022-07-11 MED ORDER — POLYETHYLENE GLYCOL 3350 17 G PO PACK: 17.0000 g | PACK | Freq: Two times a day (BID) | ORAL | 0 refills | Status: AC

## 2022-07-11 MED ORDER — OXYCODONE HCL 5 MG PO TABS: 5.0000 mg | ORAL_TABLET | ORAL | 0 refills | Status: DC | PRN

## 2022-07-11 MED ORDER — APIXABAN 5 MG PO TABS: 5.0000 mg | ORAL_TABLET | Freq: Two times a day (BID) | ORAL | 2 refills | Status: DC

## 2022-07-11 NOTE — Progress Notes (Signed)
Notified Theresa Duty, PA that patient was not ready to discharge today. Patient felt like she needs another night here for pain control and to work more with PT. Discharge date modified to 07/12/22. Will continue to monitor patient.

## 2022-07-11 NOTE — Progress Notes (Signed)
Subjective: 1 Day Post-Op Procedure(s) (LRB): TOTAL KNEE ARTHROPLASTY (Left) Patient reports pain as mild.   Patient seen in rounds by Dr. Alvan Dame. Patient is well, and has had no acute complaints or problems. No acute events overnight. Foley catheter removed. Patient has not been up with PT yet.  We will start therapy today.   Objective: Vital signs in last 24 hours: Temp:  [97.6 F (36.4 C)-98 F (36.7 C)] 97.7 F (36.5 C) (10/18 0528) Pulse Rate:  [71-102] 97 (10/18 0528) Resp:  [10-23] 18 (10/18 0528) BP: (103-156)/(63-97) 110/63 (10/18 0528) SpO2:  [92 %-100 %] 99 % (10/18 0528) Weight:  [92.3 kg] 92.3 kg (10/17 1833)  Intake/Output from previous day:  Intake/Output Summary (Last 24 hours) at 07/11/2022 0800 Last data filed at 07/11/2022 0528 Gross per 24 hour  Intake 1960 ml  Output 1925 ml  Net 35 ml     Intake/Output this shift: No intake/output data recorded.  Labs: Recent Labs    07/11/22 0349  HGB 11.0*   Recent Labs    07/11/22 0349  WBC 8.7  RBC 4.90  HCT 37.4  PLT 261   Recent Labs    07/11/22 0349  NA 139  K 4.2  CL 109  CO2 22  BUN 11  CREATININE 0.81  GLUCOSE 220*  CALCIUM 8.4*   No results for input(s): "LABPT", "INR" in the last 72 hours.  Exam: General - Patient is Alert and Oriented Extremity - Neurologically intact Sensation intact distally Intact pulses distally Dorsiflexion/Plantar flexion intact Dressing - dressing C/D/I Motor Function - intact, moving foot and toes well on exam.   Past Medical History:  Diagnosis Date   Anxiety    Arthritis    Carpal tunnel syndrome    Chronic bronchitis (HCC)    Chronic kidney disease    COPD (chronic obstructive pulmonary disease) (HCC)    Fibromyalgia    GERD (gastroesophageal reflux disease)    Headache    Chronic migraines   Hypertension    Hyperthyroidism    IBS (irritable bowel syndrome)    IC (interstitial cystitis)    Neuromuscular disorder (HCC)    fibromyalgia    Pneumonia 2001   hx of x 2   Pre-diabetes    Psoriatic arthritis (HCC)     Assessment/Plan: 1 Day Post-Op Procedure(s) (LRB): TOTAL KNEE ARTHROPLASTY (Left) Principal Problem:   S/P total knee arthroplasty, left  Estimated body mass index is 34.93 kg/m as calculated from the following:   Height as of this encounter: '5\' 4"'$  (1.626 m).   Weight as of this encounter: 92.3 kg. Advance diet Up with therapy D/C IV fluids   Patient's anticipated LOS is less than 2 midnights, meeting these requirements: - Younger than 42 - Lives within 1 hour of care - Has a competent adult at home to recover with post-op recover - NO history of  - Chronic pain requiring opiods  - Diabetes  - Coronary Artery Disease  - Heart failure  - Heart attack  - Stroke  - DVT/VTE  - Cardiac arrhythmia  - Respiratory Failure/COPD  - Renal failure  - Anemia  - Advanced Liver disease     DVT Prophylaxis -  eliquis Weight bearing as tolerated.  Hgb stable at 11.0 this AM.  Plan is to go Home after hospital stay. Plan for discharge today following 1-2 sessions of PT as long as they are meeting their goals. Patient is scheduled for OPPT. Follow up in the office in  2 weeks.   Griffith Citron, PA-C Orthopedic Surgery [phone] 07/11/2022, 8:00 AM

## 2022-07-11 NOTE — Progress Notes (Signed)
Physical Therapy Treatment Patient Details Name: AMBREA HEGLER MRN: [id number] DOB: 1966-07-08 Today's Date: 07/11/2022   History of Present Illness PT is a 56 year old female s/p Left TKA on 07/10/22.  PMHx significant for but not limited to, R TKA 11/07/21, R THA, CKD, COPD, fibromyalgia    PT Comments    Pt ambulated in hallway and practiced safe stair technique.  Pt does not feel comfortable discharging home today (RN notified).   Recommendations for follow up therapy are one component of a multi-disciplinary discharge planning process, led by the attending physician.  Recommendations may be updated based on patient status, additional functional criteria and insurance authorization.  Follow Up Recommendations  Follow physician's recommendations for discharge plan and follow up therapies     Assistance Recommended at Discharge    Patient can return home with the following Help with stairs or ramp for entrance   Equipment Recommendations  None recommended by PT    Recommendations for Other Services       Precautions / Restrictions Precautions Precautions: Fall;Knee Restrictions Weight Bearing Restrictions: No LLE Weight Bearing: Weight bearing as tolerated     Mobility  Bed Mobility Overal bed mobility: Needs Assistance Bed Mobility: Supine to Sit     Supine to sit: Supervision, HOB elevated     General bed mobility comments: pt in recliner    Transfers Overall transfer level: Needs assistance Equipment used: Rolling walker (2 wheels) Transfers: Sit to/from Stand Sit to Stand: Min guard, From elevated surface, Min assist           General transfer comment: verbal cues for UE and LE positioning,  min assist with LOB while lifting a hand off RW    Ambulation/Gait Ambulation/Gait assistance: Min guard Gait Distance (Feet): 200 Feet Assistive device: Rolling walker (2 wheels) Gait Pattern/deviations: Step-through pattern, Decreased stance time - left,  Antalgic Gait velocity: decr     General Gait Details: verbal cues for sequence, RW positioning, step length   Stairs Stairs: Yes Stairs assistance: Min guard Stair Management: Step to pattern, Forwards, With cane, One rail Right Number of Stairs: 3 General stair comments: verbal cues for sequence, SPC positioning, safety; performed twice and pt reports understanding   Wheelchair Mobility    Modified Rankin (Stroke Patients Only)       Balance                                            Cognition Arousal/Alertness: Awake/alert Behavior During Therapy: WFL for tasks assessed/performed Overall Cognitive Status: Within Functional Limits for tasks assessed                                          Exercises Total Joint Exercises Ankle Circles/Pumps: AROM, Both, 10 reps Quad Sets: AROM, Both, 10 reps Heel Slides: AAROM, Left, 10 reps Hip ABduction/ADduction: AROM, Left, 10 reps Straight Leg Raises: AROM, Left, 10 reps    General Comments        Pertinent Vitals/Pain Pain Assessment Pain Assessment: 0-10 Pain Score: 7  Pain Location: left knee Pain Descriptors / Indicators: Burning Pain Intervention(s): Repositioned, Monitored during session, Ice applied    Home Living  Prior Function            PT Goals (current goals can now be found in the care plan section) Acute Rehab PT Goals PT Goal Formulation: With patient Time For Goal Achievement: 07/18/22 Potential to Achieve Goals: Good Progress towards PT goals: Progressing toward goals    Frequency    7X/week      PT Plan Current plan remains appropriate    Co-evaluation              AM-PAC PT "6 Clicks" Mobility   Outcome Measure  Help needed turning from your back to your side while in a flat bed without using bedrails?: A Little Help needed moving from lying on your back to sitting on the side of a flat bed without  using bedrails?: A Little Help needed moving to and from a bed to a chair (including a wheelchair)?: A Little Help needed standing up from a chair using your arms (e.g., wheelchair or bedside chair)?: A Little Help needed to walk in hospital room?: A Little Help needed climbing 3-5 steps with a railing? : A Little 6 Click Score: 18    End of Session Equipment Utilized During Treatment: Gait belt Activity Tolerance: Patient tolerated treatment well Patient left: in chair;with call bell/phone within reach;with chair alarm set   PT Visit Diagnosis: Other abnormalities of gait and mobility (R26.89)     Time: 5615-3794 PT Time Calculation (min) (ACUTE ONLY): 17 min  Charges:  $Gait Training: 8-22 mins                    Arlyce Dice, DPT Physical Therapist Acute Rehabilitation Services Preferred contact method: Secure Chat Weekend Pager Only: [phone] Office: [phone]    Myrtis Hopping [place] 07/11/2022, 2:46 PM

## 2022-07-11 NOTE — Evaluation (Signed)
Physical Therapy Evaluation Patient Details Name: Christine Bradley MRN: [id number] DOB: 05-30-1966 Today's Date: 07/11/2022  History of Present Illness  PT is a 56 year old female s/p Left TKA on 07/10/22.  PMHx significant for but not limited to, R TKA 11/07/21, R THA, CKD, COPD, fibromyalgia  Clinical Impression  Pt is s/p TKA resulting in the deficits listed below (see PT Problem List).  Pt will benefit from skilled PT to increase their independence and safety with mobility to allow discharge to the venue listed below.  Pt ambulated in hallway and performed LE exercises.  Pt reports she will need to practice steps prior to d/c and plans to return home with her mother.        Recommendations for follow up therapy are one component of a multi-disciplinary discharge planning process, led by the attending physician.  Recommendations may be updated based on patient status, additional functional criteria and insurance authorization.  Follow Up Recommendations Follow physician's recommendations for discharge plan and follow up therapies      Assistance Recommended at Discharge    Patient can return home with the following  Help with stairs or ramp for entrance    Equipment Recommendations None recommended by PT  Recommendations for Other Services       Functional Status Assessment Patient has had a recent decline in their functional status and demonstrates the ability to make significant improvements in function in a reasonable and predictable amount of time.     Precautions / Restrictions Precautions Precautions: Fall;Knee Restrictions Weight Bearing Restrictions: No LLE Weight Bearing: Weight bearing as tolerated      Mobility  Bed Mobility Overal bed mobility: Needs Assistance Bed Mobility: Supine to Sit     Supine to sit: Supervision, HOB elevated     General bed mobility comments: increased time and effort    Transfers Overall transfer level: Needs assistance Equipment  used: Rolling walker (2 wheels) Transfers: Sit to/from Stand Sit to Stand: Min guard, From elevated surface           General transfer comment: verbal cues for UE and LE positioning    Ambulation/Gait Ambulation/Gait assistance: Min guard Gait Distance (Feet): 100 Feet Assistive device: Rolling walker (2 wheels) Gait Pattern/deviations: Step-to pattern, Step-through pattern, Decreased stance time - left, Antalgic       General Gait Details: verbal cues for sequence, RW positioning, step length  Stairs            Wheelchair Mobility    Modified Rankin (Stroke Patients Only)       Balance                                             Pertinent Vitals/Pain Pain Assessment Pain Assessment: 0-10 Pain Score: 10-Worst pain ever Pain Location: left knee Pain Descriptors / Indicators: Burning Pain Intervention(s): Monitored during session, Repositioned, Premedicated before session    Home Living Family/patient expects to be discharged to:: Private residence Living Arrangements: Parent Available Help at Discharge: Family;Available 24 hours/day   Home Access: Stairs to enter Entrance Stairs-Rails: Chemical engineer of Steps: 3   Home Layout: One level Home Equipment: Cane - single Barista (2 wheels)      Prior Function Prior Level of Function : Independent/Modified Independent  Hand Dominance        Extremity/Trunk Assessment        Lower Extremity Assessment Lower Extremity Assessment: LLE deficits/detail LLE Deficits / Details: AAROM left knee flexion approx 40*, able to perform SLR and ankle pumps       Communication   Communication: No difficulties  Cognition Arousal/Alertness: Awake/alert Behavior During Therapy: WFL for tasks assessed/performed Overall Cognitive Status: Within Functional Limits for tasks assessed                                           General Comments      Exercises Total Joint Exercises Ankle Circles/Pumps: AROM, Both, 10 reps Quad Sets: AROM, Both, 10 reps Heel Slides: AAROM, Left, 10 reps Hip ABduction/ADduction: AROM, Left, 10 reps Straight Leg Raises: AROM, Left, 10 reps   Assessment/Plan    PT Assessment Patient needs continued PT services  PT Problem List Decreased strength;Decreased range of motion;Decreased mobility;Pain       PT Treatment Interventions Stair training;Gait training;DME instruction;Therapeutic exercise;Functional mobility training;Therapeutic activities;Patient/family education    PT Goals (Current goals can be found in the Care Plan section)  Acute Rehab PT Goals PT Goal Formulation: With patient Time For Goal Achievement: 07/18/22 Potential to Achieve Goals: Good    Frequency 7X/week     Co-evaluation               AM-PAC PT "6 Clicks" Mobility  Outcome Measure Help needed turning from your back to your side while in a flat bed without using bedrails?: A Little Help needed moving from lying on your back to sitting on the side of a flat bed without using bedrails?: A Little Help needed moving to and from a bed to a chair (including a wheelchair)?: A Little Help needed standing up from a chair using your arms (e.g., wheelchair or bedside chair)?: A Little Help needed to walk in hospital room?: A Little Help needed climbing 3-5 steps with a railing? : A Little 6 Click Score: 18    End of Session Equipment Utilized During Treatment: Gait belt Activity Tolerance: Patient tolerated treatment well Patient left: in chair;with call bell/phone within reach   PT Visit Diagnosis: Other abnormalities of gait and mobility (R26.89)    Time: 4193-7902 PT Time Calculation (min) (ACUTE ONLY): 21 min   Charges:   PT Evaluation $PT Eval Low Complexity: 1 Low         Kati PT, DPT Physical Therapist Acute Rehabilitation Services Preferred contact method: Secure  Chat Weekend Pager Only: [phone] Office: [place] 07/11/2022, 11:52 AM

## 2022-07-11 NOTE — Plan of Care (Signed)
  Problem: Education: Goal: Knowledge of General Education information will improve Description: Including pain rating scale, medication(s)/side effects and non-pharmacologic comfort measures Outcome: Progressing   Problem: Activity: Goal: Risk for activity intolerance will decrease Outcome: Progressing   

## 2022-07-11 NOTE — TOC Transition Note (Signed)
Transition of Care [facility]) - CM/SW Discharge Note   Patient Details  Name: SUESAN MOHRMANN MRN: [id number] Date of Birth: January 08, 1966  [facility] CM/SW Contact:  Lennart Pall, LCSW Phone Number: 07/11/2022, 10:21 AM   Clinical Narrative:    Met with pt and confirming she has all needed DME and OPPT arranged with [facility].  No TOC needs.   Final next level of care: OP Rehab Barriers to Discharge: No Barriers Identified   Patient Goals and CMS Choice Patient states their goals for this hospitalization and ongoing recovery are:: return home      Discharge Placement                       Discharge Plan and Services                DME Arranged: N/A DME Agency: NA                  Social Determinants of Health (SDOH) Interventions     Readmission Risk Interventions     No data to display

## 2022-07-12 DIAGNOSIS — M1712 Unilateral primary osteoarthritis, left knee: Secondary | ICD-10-CM | POA: Diagnosis not present

## 2022-07-12 LAB — CBC
HCT: 34.7 % — ABNORMAL LOW (ref 36.0–46.0)
Hemoglobin: 10.3 g/dL — ABNORMAL LOW (ref 12.0–15.0)
MCH: 22.3 pg — ABNORMAL LOW (ref 26.0–34.0)
MCHC: 29.7 g/dL — ABNORMAL LOW (ref 30.0–36.0)
MCV: 75.1 fL — ABNORMAL LOW (ref 80.0–100.0)
Platelets: 273 10*3/uL (ref 150–400)
RBC: 4.62 MIL/uL (ref 3.87–5.11)
RDW: 16.7 % — ABNORMAL HIGH (ref 11.5–15.5)
WBC: 16.3 10*3/uL — ABNORMAL HIGH (ref 4.0–10.5)
nRBC: 0 % (ref 0.0–0.2)

## 2022-07-12 NOTE — Progress Notes (Signed)
Physical Therapy Treatment Patient Details Name: Christine Bradley MRN: [id number] DOB: 02-24-1966 Today's Date: 07/12/2022   History of Present Illness PT is a 56 year old female s/p Left TKA on 07/10/22.  PMHx significant for but not limited to, R TKA 11/07/21, R THA, CKD, COPD, fibromyalgia    PT Comments    Pt ambulated in hallway and practiced safe stair technique.  Pt would like one more session prior to d/c home today.   Recommendations for follow up therapy are one component of a multi-disciplinary discharge planning process, led by the attending physician.  Recommendations may be updated based on patient status, additional functional criteria and insurance authorization.  Follow Up Recommendations  Follow physician's recommendations for discharge plan and follow up therapies     Assistance Recommended at Discharge    Patient can return home with the following Help with stairs or ramp for entrance   Equipment Recommendations  None recommended by PT    Recommendations for Other Services       Precautions / Restrictions Precautions Precautions: Fall;Knee Restrictions LLE Weight Bearing: Weight bearing as tolerated     Mobility  Bed Mobility Overal bed mobility: Needs Assistance Bed Mobility: Supine to Sit     Supine to sit: Supervision, HOB elevated          Transfers Overall transfer level: Needs assistance Equipment used: Rolling walker (2 wheels) Transfers: Sit to/from Stand Sit to Stand: Min guard, Supervision           General transfer comment: verbal cues for UE and LE positioning    Ambulation/Gait Ambulation/Gait assistance: Min guard, Supervision Gait Distance (Feet): 280 Feet Assistive device: Rolling walker (2 wheels) Gait Pattern/deviations: Step-through pattern, Decreased stance time - left, Antalgic Gait velocity: decr     General Gait Details: verbal cues for sequence, RW positioning, step length   Stairs Stairs: Yes Stairs  assistance: Min guard Stair Management: Step to pattern, Forwards, With cane, One rail Right Number of Stairs: 3 General stair comments: verbal cues for sequence, SPC positioning, safety; performed twice and pt reports understanding   Wheelchair Mobility    Modified Rankin (Stroke Patients Only)       Balance                                            Cognition Arousal/Alertness: Awake/alert Behavior During Therapy: WFL for tasks assessed/performed Overall Cognitive Status: Within Functional Limits for tasks assessed                                          Exercises      General Comments        Pertinent Vitals/Pain Pain Assessment Pain Assessment: 0-10 Pain Score: 6  Pain Location: left knee Pain Descriptors / Indicators: Sore, Burning Pain Intervention(s): Repositioned, Monitored during session    Home Living                          Prior Function            PT Goals (current goals can now be found in the care plan section) Progress towards PT goals: Progressing toward goals    Frequency    7X/week      PT  Plan Current plan remains appropriate    Co-evaluation              AM-PAC PT "6 Clicks" Mobility   Outcome Measure  Help needed turning from your back to your side while in a flat bed without using bedrails?: A Little Help needed moving from lying on your back to sitting on the side of a flat bed without using bedrails?: A Little Help needed moving to and from a bed to a chair (including a wheelchair)?: A Little Help needed standing up from a chair using your arms (e.g., wheelchair or bedside chair)?: A Little Help needed to walk in hospital room?: A Little Help needed climbing 3-5 steps with a railing? : A Little 6 Click Score: 18    End of Session Equipment Utilized During Treatment: Gait belt Activity Tolerance: Patient tolerated treatment well Patient left: in chair;with call  bell/phone within reach;with chair alarm set   PT Visit Diagnosis: Other abnormalities of gait and mobility (R26.89)     Time: 7340-3709 PT Time Calculation (min) (ACUTE ONLY): 15 min  Charges:  $Gait Training: 8-22 mins                     Arlyce Dice, DPT Physical Therapist Acute Rehabilitation Services Preferred contact method: Secure Chat Weekend Pager Only: [phone] Office: [place] 07/12/2022, 11:19 AM

## 2022-07-12 NOTE — Progress Notes (Signed)
Physical Therapy Treatment Patient Details Name: Christine Bradley MRN: [id number] DOB: [birth year] Today's Date: 07/12/2022   History of Present Illness PT is a 56 year old female s/p Left TKA on 07/10/22.  PMHx significant for but not limited to, R TKA 11/07/21, R THA, CKD, COPD, fibromyalgia    PT Comments    Pt ambulated in hallway and performed LE exercises.  Pt provided with HEP handout.  Pt had no further questions and feels ready for d/c home today.    Recommendations for follow up therapy are one component of a multi-disciplinary discharge planning process, led by the attending physician.  Recommendations may be updated based on patient status, additional functional criteria and insurance authorization.  Follow Up Recommendations  Follow physician's recommendations for discharge plan and follow up therapies     Assistance Recommended at Discharge    Patient can return home with the following Help with stairs or ramp for entrance   Equipment Recommendations  None recommended by PT    Recommendations for Other Services       Precautions / Restrictions Precautions Precautions: Fall;Knee Restrictions LLE Weight Bearing: Weight bearing as tolerated     Mobility  Bed Mobility Overal bed mobility: Needs Assistance Bed Mobility: Sit to Supine     Supine to sit: Supervision, HOB elevated Sit to supine: Supervision, HOB elevated        Transfers Overall transfer level: Needs assistance Equipment used: Rolling walker (2 wheels) Transfers: Sit to/from Stand Sit to Stand: Supervision           General transfer comment: verbal cues for UE and LE positioning    Ambulation/Gait Ambulation/Gait assistance: Supervision Gait Distance (Feet): 220 Feet Assistive device: Rolling walker (2 wheels) Gait Pattern/deviations: Step-through pattern, Decreased stance time - left, Antalgic Gait velocity: decr     General Gait Details: mobilizing well with  RW   Stairs   Wheelchair Mobility    Modified Rankin (Stroke Patients Only)       Balance                                            Cognition Arousal/Alertness: Awake/alert Behavior During Therapy: WFL for tasks assessed/performed Overall Cognitive Status: Within Functional Limits for tasks assessed                                          Exercises Total Joint Exercises Ankle Circles/Pumps: AROM, Both, 10 reps Quad Sets: AROM, Both, 10 reps Heel Slides: Left, 10 reps, Seated, AROM Hip ABduction/ADduction: AROM, Left, 10 reps Straight Leg Raises: AROM, Left, 10 reps Long Arc Quad: AAROM, Seated, Left, 10 reps    General Comments        Pertinent Vitals/Pain Pain Assessment Pain Assessment: 0-10 Pain Score: 5  Pain Location: left knee Pain Descriptors / Indicators: Sore, Burning Pain Intervention(s): Repositioned, Monitored during session    Home Living                          Prior Function            PT Goals (current goals can now be found in the care plan section) Progress towards PT goals: Progressing toward goals    Frequency  7X/week      PT Plan Current plan remains appropriate    Co-evaluation              AM-PAC PT "6 Clicks" Mobility   Outcome Measure  Help needed turning from your back to your side while in a flat bed without using bedrails?: A Little Help needed moving from lying on your back to sitting on the side of a flat bed without using bedrails?: A Little Help needed moving to and from a bed to a chair (including a wheelchair)?: A Little Help needed standing up from a chair using your arms (e.g., wheelchair or bedside chair)?: A Little Help needed to walk in hospital room?: A Little Help needed climbing 3-5 steps with a railing? : A Little 6 Click Score: 18    End of Session Equipment Utilized During Treatment: Gait belt Activity Tolerance: Patient tolerated  treatment well Patient left: in bed;with call bell/phone within reach Nurse Communication: Mobility status PT Visit Diagnosis: Other abnormalities of gait and mobility (R26.89)     Time: 4599-7741 PT Time Calculation (min) (ACUTE ONLY): 18 min  Charges:  $Therapeutic Exercise: 8-22 mins           Jannette Spanner PT, DPT Physical Therapist Acute Rehabilitation Services Preferred contact method: Secure Chat Weekend Pager Only: [phone] Office: [phone]    Myrtis Hopping [place] 07/12/2022, 3:27 PM

## 2022-07-12 NOTE — Plan of Care (Signed)
  Problem: Activity: Goal: Ability to avoid complications of mobility impairment will improve Outcome: Progressing   

## 2022-07-12 NOTE — Progress Notes (Signed)
Subjective: 2 Days Post-Op Procedure(s) (LRB): TOTAL KNEE ARTHROPLASTY (Left) Patient reports pain as mild.   Patient seen in rounds for Dr. Alvan Dame. Patient is sitting up in bed, just finishing breakfast. No acute events overnight. Ambulated 200 feet with PT.  We will continue therapy today.   Objective: Vital signs in last 24 hours: Temp:  [97.5 F (36.4 C)-98.2 F (36.8 C)] 97.5 F (36.4 C) (10/19 0436) Pulse Rate:  [99-110] 99 (10/19 0436) Resp:  [16-18] 18 (10/19 0436) BP: (135-148)/(76-91) 147/91 (10/19 0436) SpO2:  [96 %-100 %] 98 % (10/19 0436)  Intake/Output from previous day:  Intake/Output Summary (Last 24 hours) at 07/12/2022 0812 Last data filed at 07/12/2022 0206 Gross per 24 hour  Intake 1498.75 ml  Output 50 ml  Net 1448.75 ml     Intake/Output this shift: No intake/output data recorded.  Labs: Recent Labs    07/11/22 0349 07/12/22 0341  HGB 11.0* 10.3*   Recent Labs    07/11/22 0349 07/12/22 0341  WBC 8.7 16.3*  RBC 4.90 4.62  HCT 37.4 34.7*  PLT 261 273   Recent Labs    07/11/22 0349  NA 139  K 4.2  CL 109  CO2 22  BUN 11  CREATININE 0.81  GLUCOSE 220*  CALCIUM 8.4*   No results for input(s): "LABPT", "INR" in the last 72 hours.  Exam: General - Patient is Alert and Oriented Extremity - Neurologically intact Sensation intact distally Intact pulses distally Dorsiflexion/Plantar flexion intact Dressing - dressing C/D/I Motor Function - intact, moving foot and toes well on exam.   Past Medical History:  Diagnosis Date   Anxiety    Arthritis    Carpal tunnel syndrome    Chronic bronchitis (HCC)    Chronic kidney disease    COPD (chronic obstructive pulmonary disease) (HCC)    Fibromyalgia    GERD (gastroesophageal reflux disease)    Headache    Chronic migraines   Hypertension    Hyperthyroidism    IBS (irritable bowel syndrome)    IC (interstitial cystitis)    Neuromuscular disorder (HCC)    fibromyalgia    Pneumonia 2001   hx of x 2   Pre-diabetes    Psoriatic arthritis (HCC)     Assessment/Plan: 2 Days Post-Op Procedure(s) (LRB): TOTAL KNEE ARTHROPLASTY (Left) Principal Problem:   S/P total knee arthroplasty, left  Estimated body mass index is 34.93 kg/m as calculated from the following:   Height as of this encounter: '5\' 4"'$  (1.626 m).   Weight as of this encounter: 92.3 kg. Advance diet Up with therapy D/C IV fluids   Patient's anticipated LOS is less than 2 midnights, meeting these requirements: - Younger than 88 - Lives within 1 hour of care - Has a competent adult at home to recover with post-op recover - NO history of  - Chronic pain requiring opiods  - Diabetes  - Coronary Artery Disease  - Heart failure  - Heart attack  - Stroke  - DVT/VTE  - Cardiac arrhythmia  - Respiratory Failure/COPD  - Renal failure  - Anemia  - Advanced Liver disease     DVT Prophylaxis -  Eliquis Weight bearing as tolerated.  Hgb stable at 10.3 this AM.  Plan is to go Home after hospital stay. Plan for discharge today following 1-2 sessions of PT as long as they are meeting their goals. Patient is scheduled for OPPT. Follow up in the office in 2 weeks.   Griffith Citron, PA-C  Orthopedic Surgery [phone] 07/12/2022, 8:12 AM

## 2022-07-17 NOTE — Discharge Summary (Signed)
Patient ID: Christine Bradley MRN: [id number] DOB/AGE: 02-Jun-1966 56 y.o.  Admit date: 07/10/2022 Discharge date: 07/12/2022  Admission Diagnoses:  Left knee osteoarthritis  Discharge Diagnoses:  Principal Problem:   S/P total knee arthroplasty, left   Past Medical History:  Diagnosis Date   Anxiety    Arthritis    Carpal tunnel syndrome    Chronic bronchitis (HCC)    Chronic kidney disease    COPD (chronic obstructive pulmonary disease) (HCC)    Fibromyalgia    GERD (gastroesophageal reflux disease)    Headache    Chronic migraines   Hypertension    Hyperthyroidism    IBS (irritable bowel syndrome)    IC (interstitial cystitis)    Neuromuscular disorder (HCC)    fibromyalgia   Pneumonia 2001   hx of x 2   Pre-diabetes    Psoriatic arthritis ([place])     Surgeries: Procedure(s): TOTAL KNEE ARTHROPLASTY on 07/10/2022   Consultants:   Discharged Condition: Improved  Hospital Course: Christine Bradley is an 56 y.o. female who was admitted 07/10/2022 for operative treatment ofS/P total knee arthroplasty, left. Patient has severe unremitting pain that affects sleep, daily activities, and work/hobbies. After pre-op clearance the patient was taken to the operating room on 07/10/2022 and underwent  Procedure(s): TOTAL KNEE ARTHROPLASTY.    Patient was given perioperative antibiotics:  Anti-infectives (From admission, onward)    Start     Dose/Rate Route Frequency Ordered Stop   07/10/22 1930  ceFAZolin (ANCEF) IVPB 2g/100 mL premix        2 g 200 mL/hr over 30 Minutes Intravenous Every 6 hours 07/10/22 1758 07/11/22 1131   07/10/22 1100  ceFAZolin (ANCEF) IVPB 2g/100 mL premix        2 g 200 mL/hr over 30 Minutes Intravenous On call to O.R. 07/10/22 1046 07/10/22 1322        Patient was given sequential compression devices, early ambulation, and chemoprophylaxis to prevent DVT. Patient worked with PT and was meeting their goals regarding safe ambulation and transfers.  Patient  benefited maximally from hospital stay and there were no complications.    Recent vital signs: No data found.   Recent laboratory studies: No results for input(s): "WBC", "HGB", "HCT", "PLT", "NA", "K", "CL", "CO2", "BUN", "CREATININE", "GLUCOSE", "INR", "CALCIUM" in the last 72 hours.  Invalid input(s): "PT", "2"   Discharge Medications:   Allergies as of 07/12/2022       Reactions   Xarelto [rivaroxaban] Other (See Comments)    exacerbated her fibromyalgia.   Albuterol Other (See Comments)   Inhaler only (nebulizer is fine) - reaction: worsened asthma and bronchitis   Hydrocodone-acetaminophen Nausea And Vomiting   Can not take Name Brand Vicodin - Generic is Ok    Maxalt [rizatriptan]    Worsened migraines    Penicillins Other (See Comments)   "raw on my bottom" Has patient had a PCN reaction causing immediate rash, facial/tongue/throat swelling, SOB or lightheadedness with hypotension: No Has patient had a PCN reaction causing severe rash involving mucus membranes or skin necrosis: No Has patient had a PCN reaction that required hospitalization No Has patient had a PCN reaction occurring within the last 10 years: No If all of the above answers are "NO", then may proceed with Cephalosporin use. Tolerated ancef 08/29/21   Voltaren [diclofenac Sodium] Other (See Comments)   Stomach ulcers - in pill form, pt is currently using the topical form        Medication List  STOP taking these medications    diclofenac Sodium 1 % Gel Commonly known as: Voltaren       TAKE these medications    acetaminophen 500 MG tablet Commonly known as: TYLENOL Take 2 tablets (1,000 mg total) by mouth every 6 (six) hours.   amLODipine 2.5 MG tablet Commonly known as: NORVASC Take 2.5 mg by mouth daily.   apixaban 5 MG Tabs tablet Commonly known as: ELIQUIS Take 1 tablet (5 mg total) by mouth every 12 (twelve) hours.   atorvastatin 10 MG tablet Commonly known as: LIPITOR Take  40 mg by mouth in the morning.   cetirizine 10 MG tablet Commonly known as: ZYRTEC Take 10 mg by mouth in the morning.   Cosentyx 150 MG/ML Sosy Generic drug: Secukinumab Inject 150 mg into the skin every 30 (thirty) days.   cyclobenzaprine 10 MG tablet Commonly known as: FLEXERIL Take 1 tablet (10 mg total) by mouth 3 (three) times daily as needed for muscle spasms. What changed:  how much to take when to take this reasons to take this   famotidine 40 MG tablet Commonly known as: PEPCID Take 40 mg by mouth at bedtime.   ferrous sulfate 325 (65 FE) MG tablet Take 325 mg by mouth in the morning.   fluticasone 50 MCG/ACT nasal spray Commonly known as: FLONASE Place 2 sprays into both nostrils daily as needed for allergies.   Fluticasone-Salmeterol 250-50 MCG/DOSE Aepb Commonly known as: ADVAIR Inhale 1 puff into the lungs 2 (two) times daily.   folic acid 1 MG tablet Commonly known as: FOLVITE Take 1 mg by mouth in the morning.   HAIR/SKIN/NAILS PO Take 2 tablets by mouth every morning.   ipratropium 17 MCG/ACT inhaler Commonly known as: ATROVENT HFA Inhale 2 puffs into the lungs every 6 (six) hours as needed for wheezing.   linaclotide 72 MCG capsule Commonly known as: LINZESS Take 72 mcg by mouth daily before breakfast.   montelukast 10 MG tablet Commonly known as: SINGULAIR Take 10 mg by mouth in the morning.   MULTIVITAMIN GUMMIES ADULT PO Take 2 each by mouth every morning.   oxybutynin 5 MG tablet Commonly known as: DITROPAN Take 5 mg by mouth at bedtime.   oxyCODONE 5 MG immediate release tablet Commonly known as: Oxy IR/ROXICODONE Take 1 tablet (5 mg total) by mouth every 4 (four) hours as needed for severe pain.   pantoprazole 40 MG tablet Commonly known as: PROTONIX Take 40 mg by mouth daily before breakfast.   Polyethyl Glycol-Propyl Glycol 0.4-0.3 % Soln Place 1 drop into both eyes in the morning.   polyethylene glycol 17 g  packet Commonly known as: MIRALAX / GLYCOLAX Take 17 g by mouth 2 (two) times daily for 14 days.   pregabalin 150 MG capsule Commonly known as: LYRICA Take 150 mg by mouth at bedtime.   venlafaxine XR 150 MG 24 hr capsule Commonly known as: EFFEXOR-XR Take 150 mg by mouth daily.   vitamin C 250 MG tablet Commonly known as: ASCORBIC ACID Take 500 mg by mouth daily.   Vitamin D 50 MCG (2000 UT) tablet Take 4,000 Units by mouth daily.               Discharge Care Instructions  (From admission, onward)           Start     Ordered   07/11/22 0000  Change dressing       Comments: Maintain surgical dressing until follow up in  the clinic. If the edges start to pull up, may reinforce with tape. If the dressing is no longer working, may remove and cover with gauze and tape, but must keep the area dry and clean.  Call with any questions or concerns.   07/11/22 0804            Diagnostic Studies: No results found.  Disposition: Discharge disposition: 01-Home or Self Care       Discharge Instructions     Call MD / Call 911   Complete by: As directed    If you experience chest pain or shortness of breath, CALL 911 and be transported to the hospital emergency room.  If you develope a fever above 101 F, pus (white drainage) or increased drainage or redness at the wound, or calf pain, call your surgeon's office.   Change dressing   Complete by: As directed    Maintain surgical dressing until follow up in the clinic. If the edges start to pull up, may reinforce with tape. If the dressing is no longer working, may remove and cover with gauze and tape, but must keep the area dry and clean.  Call with any questions or concerns.   Constipation Prevention   Complete by: As directed    Drink plenty of fluids.  Prune juice may be helpful.  You may use a stool softener, such as Colace (over the counter) 100 mg twice a day.  Use MiraLax (over the counter) for constipation as  needed.   Diet - low sodium heart healthy   Complete by: As directed    Increase activity slowly as tolerated   Complete by: As directed    Weight bearing as tolerated with assist device (walker, cane, etc) as directed, use it as long as suggested by your surgeon or therapist, typically at least 4-6 weeks.   Post-operative opioid taper instructions:   Complete by: As directed    POST-OPERATIVE OPIOID TAPER INSTRUCTIONS: It is important to wean off of your opioid medication as soon as possible. If you do not need pain medication after your surgery it is ok to stop day one. Opioids include: Codeine, Hydrocodone(Norco, Vicodin), Oxycodone(Percocet, oxycontin) and hydromorphone amongst others.  Long term and even short term use of opiods can cause: Increased pain response Dependence Constipation Depression Respiratory depression And more.  Withdrawal symptoms can include Flu like symptoms Nausea, vomiting And more Techniques to manage these symptoms Hydrate well Eat regular healthy meals Stay active Use relaxation techniques(deep breathing, meditating, yoga) Do Not substitute Alcohol to help with tapering If you have been on opioids for less than two weeks and do not have pain than it is ok to stop all together.  Plan to wean off of opioids This plan should start within one week post op of your joint replacement. Maintain the same interval or time between taking each dose and first decrease the dose.  Cut the total daily intake of opioids by one tablet each day Next start to increase the time between doses. The last dose that should be eliminated is the evening dose.      TED hose   Complete by: As directed    Use stockings (TED hose) for 2 weeks on both leg(s).  You may remove them at night for sleeping.        Follow-up Information     Paralee Cancel, MD. Schedule an appointment as soon as possible for a visit in 2 week(s).   Specialty: Orthopedic Surgery Contact  information: [address] [place] [address] [phone]                  Signed: Irving Copas 07/17/2022, 4:20 PM

## 2022-07-30 DIAGNOSIS — I10 Essential (primary) hypertension: Secondary | ICD-10-CM | POA: Insufficient documentation

## 2022-08-20 ENCOUNTER — Ambulatory Visit
Admission: RE | Admit: 2022-08-20 | Discharge: 2022-08-20 | Disposition: A | Discharge status: Home / Self Care | Payer: Medicare HMO | Source: Ambulatory Visit | Attending: Family Medicine | Admitting: Family Medicine

## 2022-08-20 ENCOUNTER — Ambulatory Visit: Payer: Medicare HMO

## 2022-08-20 ENCOUNTER — Ambulatory Visit: Admission: RE | Admit: 2022-08-20 | Payer: Medicare HMO | Source: Ambulatory Visit

## 2022-08-20 DIAGNOSIS — N644 Mastodynia: Secondary | ICD-10-CM

## 2022-08-26 IMAGING — RF DG HIP (WITH PELVIS) OPERATIVE*R*
1 series · 2 of 2 positions shown · non-contrast
Comparison: None.

CLINICAL DATA: Status post right total hip replacement.

EXAM:
OPERATIVE right HIP (WITH PELVIS IF PERFORMED) 2 VIEWS
TECHNIQUE: Fluoroscopic spot image(s) were submitted for interpretation
post-operatively.
Radiation exposure index: 1.9425 mGy.

[Series 1: unknown protocol · 0.00mm/px · 2 of 2 slices shown]
[im 1/2]
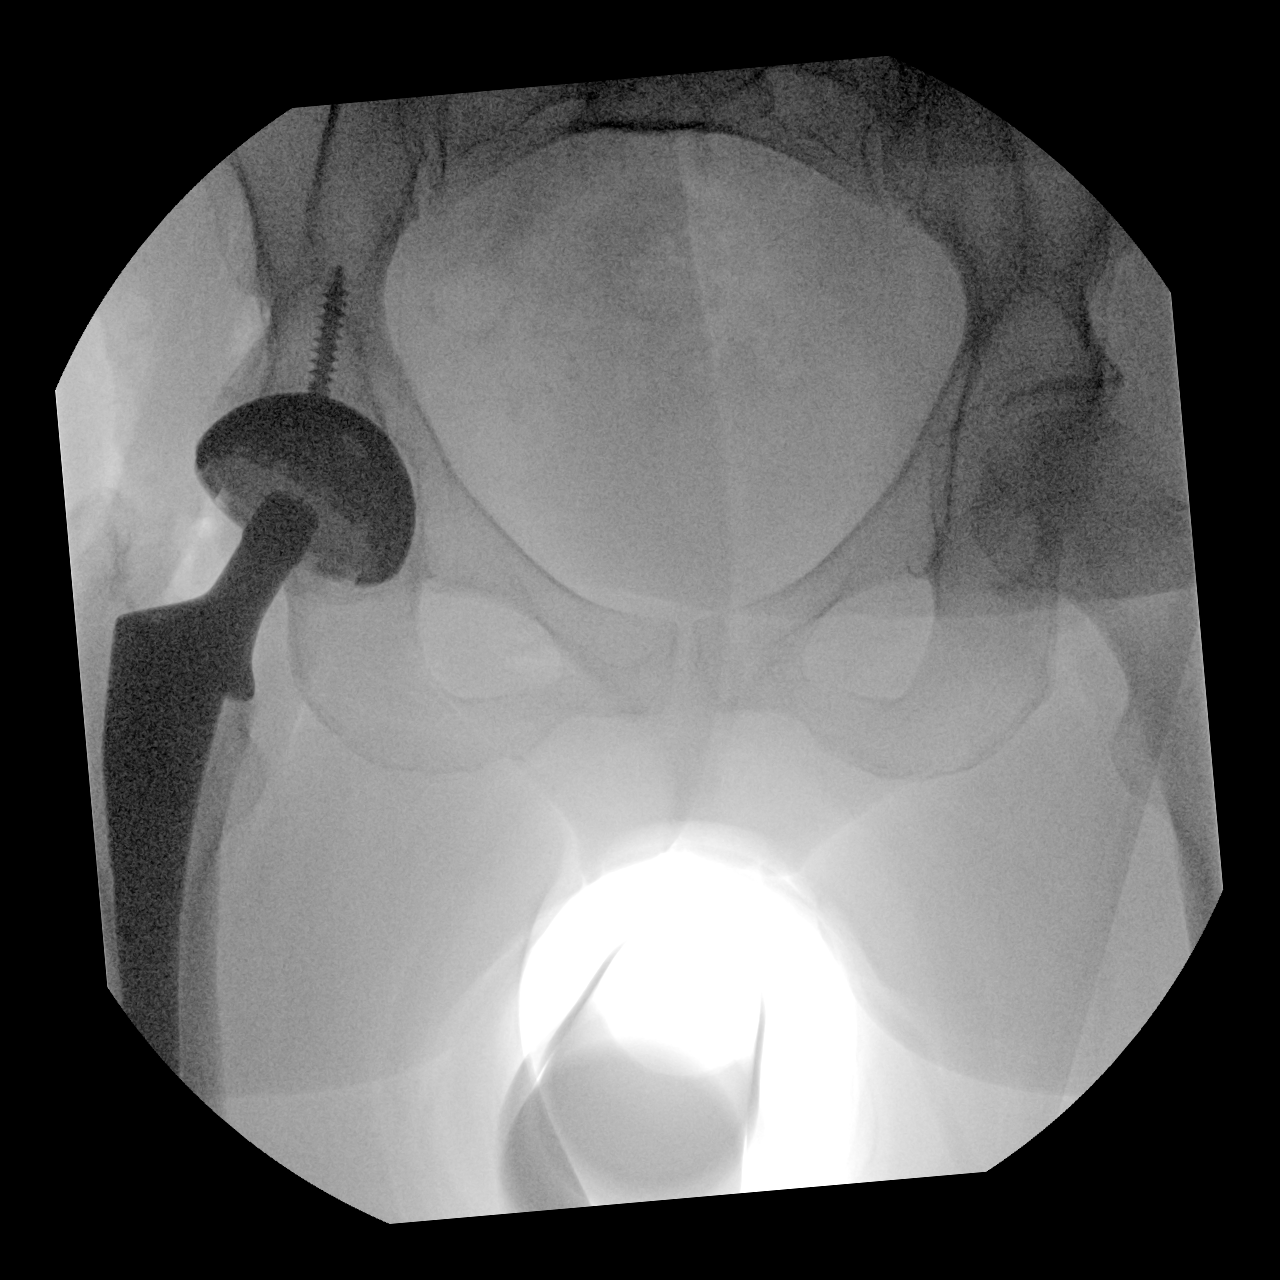
[im 2/2]
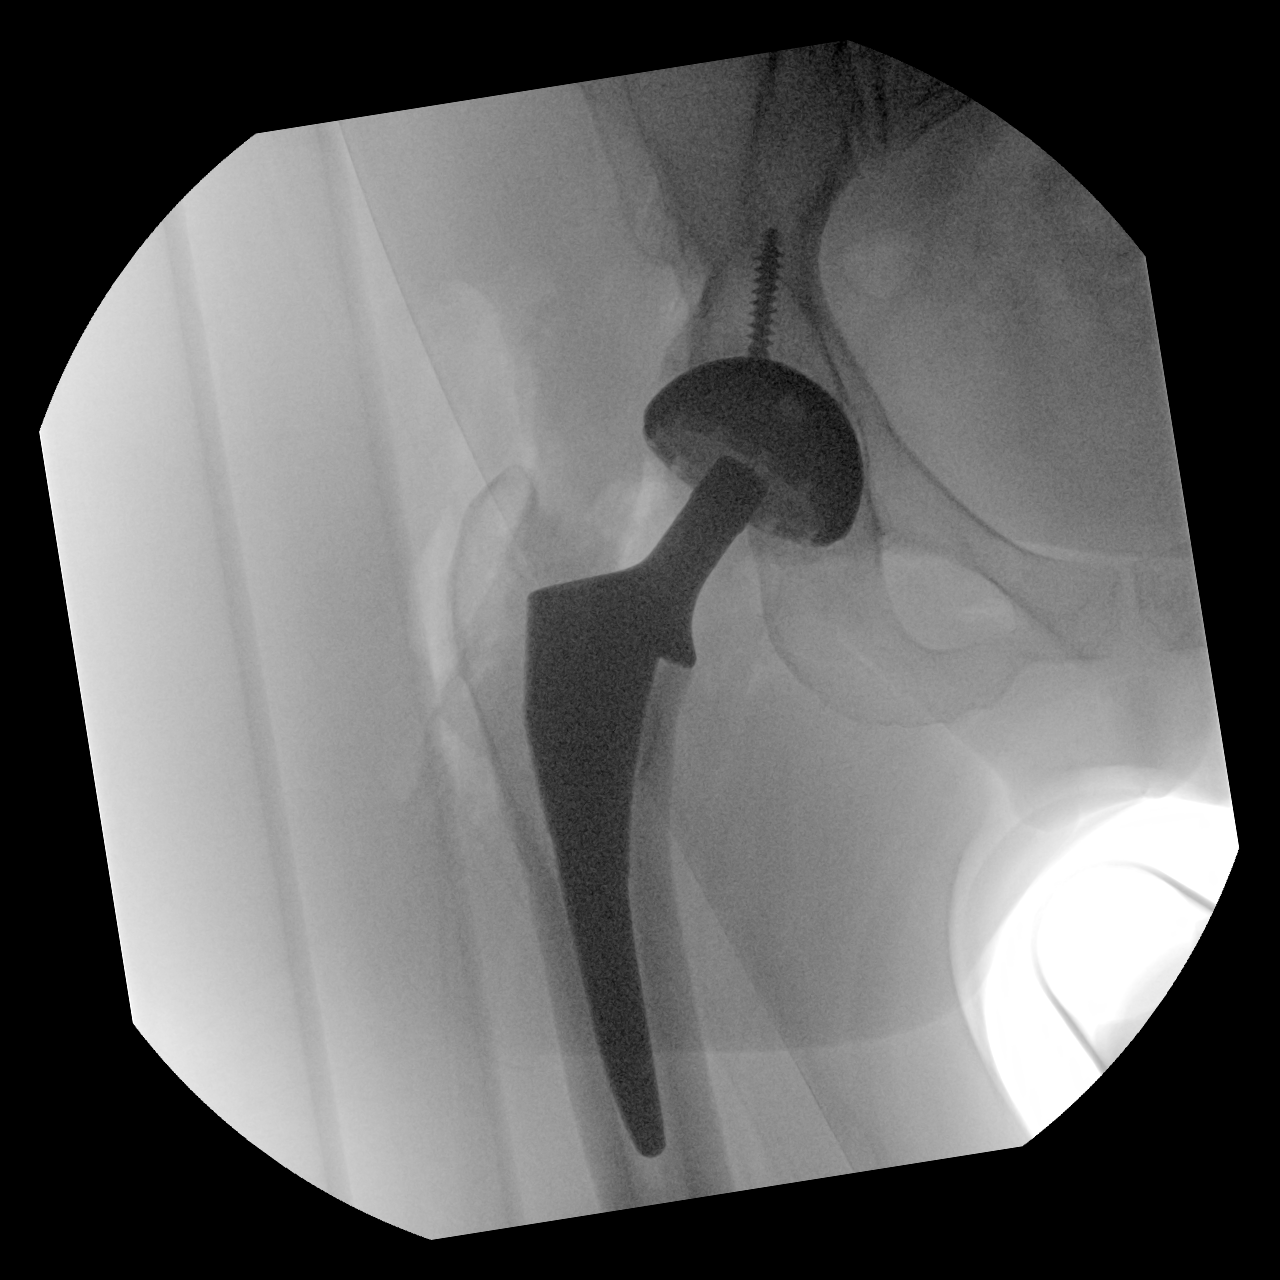

[2 of 2 positions shown; findings below may reference images not displayed]

FINDINGS: Two intraoperative fluoroscopic images were obtained of the right
hip. The right femoral and acetabular components appear to be well
situated.
IMPRESSION: Fluoroscopic guidance provided during right total hip arthroplasty.

## 2022-08-26 IMAGING — DX DG PORTABLE PELVIS
1 series · 1 of 1 positions shown · non-contrast
Comparison: 08/11/2021

CLINICAL DATA: Postop

EXAM:
PORTABLE PELVIS 1-2 VIEWS

[pelvis ap]
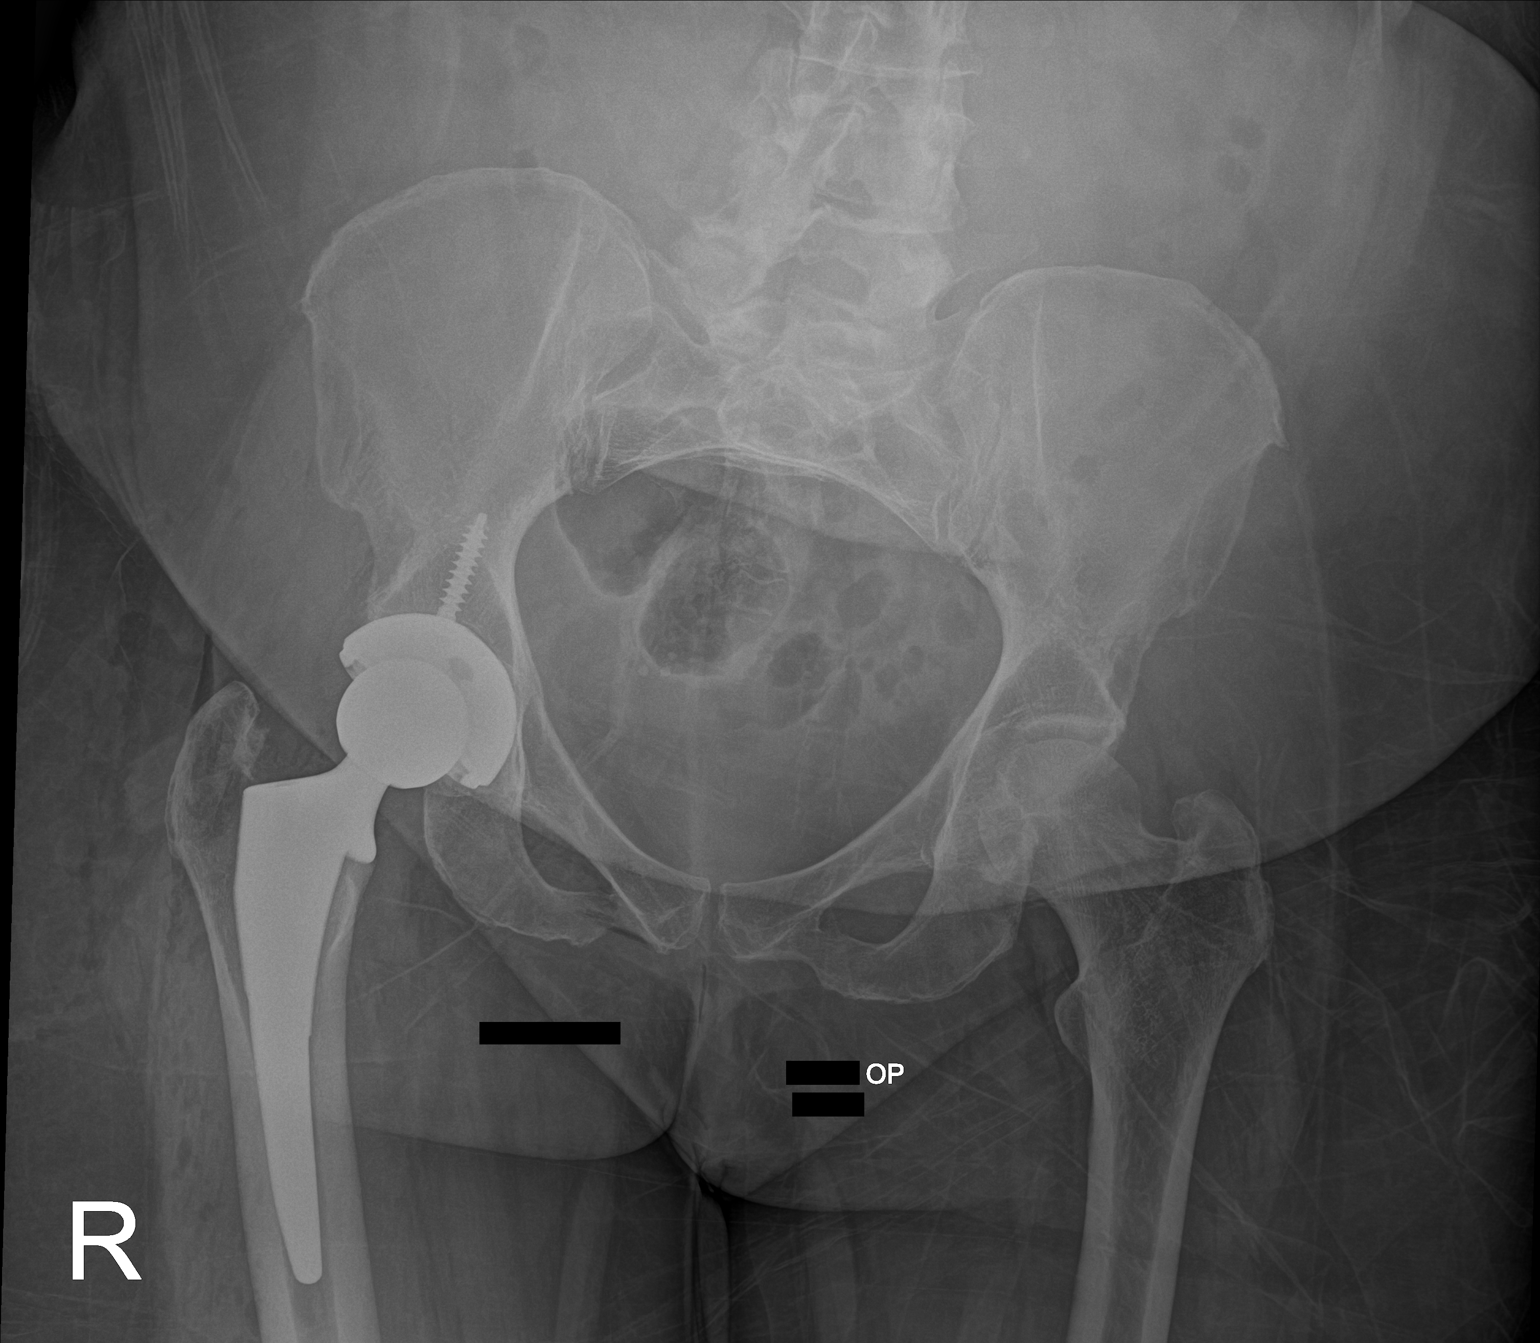

[1 of 1 positions shown; findings below may reference images not displayed]

FINDINGS: Status post right hip replacement with intact hardware and normal
alignment. No fracture is seen. Gas in the soft tissues consistent
with recent surgery.
IMPRESSION: Status post right hip replacement with expected postsurgical change.

## 2022-10-05 ENCOUNTER — Other Ambulatory Visit: Payer: Self-pay | Admitting: Hematology and Oncology

## 2022-10-05 ENCOUNTER — Other Ambulatory Visit: Payer: Self-pay | Admitting: Lab

## 2022-10-08 ENCOUNTER — Inpatient Hospital Stay: Payer: Medicare HMO | Attending: Hematology and Oncology

## 2022-10-08 ENCOUNTER — Inpatient Hospital Stay: Payer: Medicare HMO

## 2022-10-08 DIAGNOSIS — Z79899 Other long term (current) drug therapy: Secondary | ICD-10-CM | POA: Diagnosis not present

## 2022-10-08 DIAGNOSIS — Z7901 Long term (current) use of anticoagulants: Secondary | ICD-10-CM | POA: Diagnosis not present

## 2022-10-08 DIAGNOSIS — Z86718 Personal history of other venous thrombosis and embolism: Secondary | ICD-10-CM | POA: Insufficient documentation

## 2022-10-08 DIAGNOSIS — I824Z1 Acute embolism and thrombosis of unspecified deep veins of right distal lower extremity: Secondary | ICD-10-CM

## 2022-10-08 DIAGNOSIS — D509 Iron deficiency anemia, unspecified: Secondary | ICD-10-CM | POA: Diagnosis present

## 2022-10-08 LAB — CBC WITH DIFFERENTIAL (CANCER CENTER ONLY)
Abs Immature Granulocytes: 0.01 10*3/uL (ref 0.00–0.07)
Basophils Absolute: 0 10*3/uL (ref 0.0–0.1)
Basophils Relative: 1 %
Eosinophils Absolute: 0.5 10*3/uL (ref 0.0–0.5)
Eosinophils Relative: 8 %
HCT: 36.5 % (ref 36.0–46.0)
Hemoglobin: 11.5 g/dL — ABNORMAL LOW (ref 12.0–15.0)
Immature Granulocytes: 0 %
Lymphocytes Relative: 40 %
Lymphs Abs: 2.6 10*3/uL (ref 0.7–4.0)
MCH: 22.9 pg — ABNORMAL LOW (ref 26.0–34.0)
MCHC: 31.5 g/dL (ref 30.0–36.0)
MCV: 72.6 fL — ABNORMAL LOW (ref 80.0–100.0)
Monocytes Absolute: 0.4 10*3/uL (ref 0.1–1.0)
Monocytes Relative: 7 %
Neutro Abs: 2.9 10*3/uL (ref 1.7–7.7)
Neutrophils Relative %: 44 %
Platelet Count: 307 10*3/uL (ref 150–400)
RBC: 5.03 MIL/uL (ref 3.87–5.11)
RDW: 16.5 % — ABNORMAL HIGH (ref 11.5–15.5)
WBC Count: 6.4 10*3/uL (ref 4.0–10.5)
nRBC: 0 % (ref 0.0–0.2)

## 2022-10-08 LAB — BASIC METABOLIC PANEL - CANCER CENTER ONLY
Anion gap: 5 (ref 5–15)
BUN: 10 mg/dL (ref 6–20)
CO2: 28 mmol/L (ref 22–32)
Calcium: 9 mg/dL (ref 8.9–10.3)
Chloride: 106 mmol/L (ref 98–111)
Creatinine: 0.72 mg/dL (ref 0.44–1.00)
GFR, Estimated: >60 mL/min (ref >60)
Glucose, Bld: 97 mg/dL (ref 70–99)
Potassium: 3.8 mmol/L (ref 3.5–5.1)
Sodium: 139 mmol/L (ref 135–145)

## 2022-10-08 LAB — D-DIMER, QUANTITATIVE: D-Dimer, Quant: 2.46 ug/mL-FEU — ABNORMAL HIGH (ref 0.00–0.50)

## 2022-10-11 ENCOUNTER — Inpatient Hospital Stay (HOSPITAL_BASED_OUTPATIENT_CLINIC_OR_DEPARTMENT_OTHER): Payer: Medicare HMO | Admitting: Hematology and Oncology

## 2022-10-11 ENCOUNTER — Encounter: Payer: Self-pay | Admitting: Hematology and Oncology

## 2022-10-11 VITALS — BP 137/92 | HR 87 | Temp 97.4°F | Resp 18 | Ht 64.0 in | Wt 211.6 lb

## 2022-10-11 DIAGNOSIS — I824Z1 Acute embolism and thrombosis of unspecified deep veins of right distal lower extremity: Secondary | ICD-10-CM

## 2022-10-11 DIAGNOSIS — D509 Iron deficiency anemia, unspecified: Secondary | ICD-10-CM

## 2022-10-11 NOTE — Progress Notes (Signed)
[place] OFFICE PROGRESS NOTE  [name], Christine Rodney, MD  ASSESSMENT & PLAN:  Acute deep vein thrombosis (DVT) of distal vein of right lower extremity ([place]) She is doing well on chronic anticoagulation therapy Her D-dimer was elevated It is difficult to interpret due to her recent fall Due to risk of recurrent DVT, I recommend she continues apixaban for 3 more months and we will reassess  Microcytic anemia She has mild persistent microcytic anemia Her iron studies were adequate I suspect she has thalassemia The combination of microcytosis, elevated RBC count and normal iron studies are highly suggestive of undiagnosed thalassemia We discussed implication of genetic testing For now, I recommend the patient to stop taking oral iron supplement I plan to recheck iron studies again in her next visit  Orders Placed This Encounter  Procedures   CBC with Differential ([place] Only)    Standing Status:   Future    Standing Expiration Date:   05/18/538   Basic Metabolic Panel - [place] Only    Standing Status:   Future    Standing Expiration Date:   10/12/2023   D-dimer, quantitative    Standing Status:   Future    Standing Expiration Date:   10/12/2023   Iron and Iron Binding Capacity ([facility] only)    Standing Status:   Future    Standing Expiration Date:   10/12/2023   Ferritin    Standing Status:   Future    Standing Expiration Date:   10/11/2023    The total time spent in the appointment was 20 minutes encounter with patients including review of chart and various tests results, discussions about plan of care and coordination of care plan   All questions were answered. The patient knows to call the clinic with any problems, questions or concerns. No barriers to learning was detected.    Heath Lark, MD 1/18/20244:39 PM  INTERVAL HISTORY: Christine Bradley 57 y.o. female returns for to review test results for history of DVT She had a fall several days ago  and has some minor bruising Denies recent bleeding We reviewed test results We discussed findings of microcytic anemia  SUMMARY OF HEMATOLOGIC HISTORY: The patient had chronic degenerative joint disease and underwent right knee surgery on November 07, 2021 10 days after surgery, she developed right lower extremity pain and swelling Venous Doppler ultrasound showed: RIGHT:  - Findings consistent with acute deep vein thrombosis involving the right common femoral vein, and right femoral vein.  - No cystic structure found in the popliteal fossa.  - Common femoral vein obstruction doesn't appear to extend above inguinal ligament.     LEFT:  - No evidence of deep vein thrombosis in the lower extremity. No indirect evidence of obstruction proximal to the inguinal ligament.  - No cystic structure found in the popliteal fossa.     She was placed on Xarelto and is doing well She is compliant taking anticoagulation therapy She has no bleeding complications She denies further right lower extremity pain or swelling She is using elastic compression hose on a regular basis She had numerous surgeries in the past including right shoulder surgery, hysterectomy and others  She has also taken birth control pill for close to 15 years without complications  She denies recent chest pain on exertion, shortness of breath on minimal exertion, pre-syncopal episodes, hemoptysis, or palpitation.  She had no prior history or diagnosis of cancer. Her age appropriate screening programs are up-to-date. She  had prior surgeries before and never had perioperative thromboembolic events.  The patient had been pregnant before and denies history of peripartum thromboembolic event; she has 2 early trimester miscarriages. There is no family history of blood clots or miscarriages.  US venous Doppler 02/15/22 BILATERAL: - No evidence of deep vein thrombosis seen in the lower extremities, bilaterally. -No evidence of  popliteal cyst, bilaterally. RIGHT: - Findings suggest resolution of previously noted thrombus. Patient is placed on 6 months of anticoagulation therapy with Eliquis; she discontinue Eliquis prior to surgery and then resume after surgery  I have reviewed the past medical history, past surgical history, social history and family history with the patient and they are unchanged from previous note.  ALLERGIES:  is allergic to xarelto [rivaroxaban], albuterol, hydrocodone-acetaminophen, maxalt [rizatriptan], penicillins, and voltaren [diclofenac sodium].  MEDICATIONS:  Current Outpatient Medications  Medication Sig Dispense Refill   amLODipine (NORVASC) 2.5 MG tablet Take 2.5 mg by mouth daily.     apixaban (ELIQUIS) 5 MG TABS tablet Take 1 tablet (5 mg total) by mouth every 12 (twelve) hours. 60 tablet 2   atorvastatin (LIPITOR) 10 MG tablet Take 40 mg by mouth in the morning.     cetirizine (ZYRTEC) 10 MG tablet Take 10 mg by mouth in the morning.     Cholecalciferol (VITAMIN D) 2000 units tablet Take 4,000 Units by mouth daily.     cyclobenzaprine (FLEXERIL) 10 MG tablet Take 1 tablet (10 mg total) by mouth 3 (three) times daily as needed for muscle spasms. 30 tablet 2   famotidine (PEPCID) 40 MG tablet Take 40 mg by mouth at bedtime.     fluticasone (FLONASE) 50 MCG/ACT nasal spray Place 2 sprays into both nostrils daily as needed for allergies.     Fluticasone-Salmeterol (ADVAIR) 250-50 MCG/DOSE AEPB Inhale 1 puff into the lungs 2 (two) times daily.     folic acid (FOLVITE) 1 MG tablet Take 1 mg by mouth in the morning.     ipratropium (ATROVENT HFA) 17 MCG/ACT inhaler Inhale 2 puffs into the lungs every 6 (six) hours as needed for wheezing.      linaclotide (LINZESS) 72 MCG capsule Take 72 mcg by mouth daily before breakfast.     montelukast (SINGULAIR) 10 MG tablet Take 10 mg by mouth in the morning.     Multiple Vitamins-Minerals (HAIR/SKIN/NAILS PO) Take 2 tablets by mouth every morning.       Multiple Vitamins-Minerals (MULTIVITAMIN GUMMIES ADULT PO) Take 2 each by mouth every morning.     oxybutynin (DITROPAN) 5 MG tablet Take 5 mg by mouth at bedtime.     oxyCODONE (OXY IR/ROXICODONE) 5 MG immediate release tablet Take 1 tablet (5 mg total) by mouth every 4 (four) hours as needed for severe pain. 42 tablet 0   pantoprazole (PROTONIX) 40 MG tablet Take 40 mg by mouth daily before breakfast.      Polyethyl Glycol-Propyl Glycol 0.4-0.3 % SOLN Place 1 drop into both eyes in the morning.     pregabalin (LYRICA) 150 MG capsule Take 150 mg by mouth at bedtime.      Secukinumab (COSENTYX) 150 MG/ML SOSY Inject 150 mg into the skin every 30 (thirty) days.     venlafaxine XR (EFFEXOR-XR) 150 MG 24 hr capsule Take 150 mg by mouth daily.     vitamin C (ASCORBIC ACID) 250 MG tablet Take 500 mg by mouth daily.     No current facility-administered medications for this visit.  REVIEW OF SYSTEMS:   Constitutional: Denies fevers, chills or night sweats Eyes: Denies blurriness of vision Ears, nose, mouth, throat, and face: Denies mucositis or sore throat Respiratory: Denies cough, dyspnea or wheezes Cardiovascular: Denies palpitation, chest discomfort or lower extremity swelling Gastrointestinal:  Denies nausea, heartburn or change in bowel habits Skin: Denies abnormal skin rashes Lymphatics: Denies new lymphadenopathy or easy bruising Neurological:Denies numbness, tingling or new weaknesses Behavioral/Psych: Mood is stable, no new changes  All other systems were reviewed with the patient and are negative.  PHYSICAL EXAMINATION: ECOG PERFORMANCE STATUS: 0 - Asymptomatic  Vitals:   10/11/22 0808  BP: (!) 137/92  Pulse: 87  Resp: 18  Temp: (!) 97.4 F (36.3 C)  SpO2: 99%   Filed Weights   10/11/22 0808  Weight: 211 lb 9.6 oz (96 kg)    GENERAL:alert, no distress and comfortable NEURO: alert & oriented x 3 with fluent speech, no focal motor/sensory deficits  LABORATORY  DATA:  I have reviewed the data as listed     Component Value Date/Time   NA 139 10/08/2022 1109   K 3.8 10/08/2022 1109   CL 106 10/08/2022 1109   CO2 28 10/08/2022 1109   GLUCOSE 97 10/08/2022 1109   BUN 10 10/08/2022 1109   CREATININE 0.72 10/08/2022 1109   CALCIUM 9.0 10/08/2022 1109   PROT 7.2 11/07/2021 1223   ALBUMIN 3.6 11/07/2021 1223   AST 20 11/07/2021 1223   ALT 15 11/07/2021 1223   ALKPHOS 68 11/07/2021 1223   BILITOT 0.5 11/07/2021 1223   GFRNONAA >60 10/08/2022 1109   GFRAA >60 04/13/2018 1926    No results found for: "SPEP", "UPEP"  Lab Results  Component Value Date   WBC 6.4 10/08/2022   NEUTROABS 2.9 10/08/2022   HGB 11.5 (L) 10/08/2022   HCT 36.5 10/08/2022   MCV 72.6 (L) 10/08/2022   PLT 307 10/08/2022      Chemistry      Component Value Date/Time   NA 139 10/08/2022 1109   K 3.8 10/08/2022 1109   CL 106 10/08/2022 1109   CO2 28 10/08/2022 1109   BUN 10 10/08/2022 1109   CREATININE 0.72 10/08/2022 1109      Component Value Date/Time   CALCIUM 9.0 10/08/2022 1109   ALKPHOS 68 11/07/2021 1223   AST 20 11/07/2021 1223   ALT 15 11/07/2021 1223   BILITOT 0.5 11/07/2021 1223

## 2022-10-11 NOTE — Assessment & Plan Note (Signed)
She is doing well on chronic anticoagulation therapy Her D-dimer was elevated It is difficult to interpret due to her recent fall Due to risk of recurrent DVT, I recommend she continues apixaban for 3 more months and we will reassess

## 2022-10-11 NOTE — Assessment & Plan Note (Signed)
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

## 2022-10-31 DIAGNOSIS — B372 Candidiasis of skin and nail: Secondary | ICD-10-CM | POA: Insufficient documentation

## 2023-01-07 ENCOUNTER — Telehealth: Payer: Self-pay | Admitting: Hematology and Oncology

## 2023-01-07 ENCOUNTER — Inpatient Hospital Stay: Payer: Medicare HMO

## 2023-01-08 ENCOUNTER — Inpatient Hospital Stay: Payer: Medicare HMO | Attending: Hematology and Oncology

## 2023-01-08 ENCOUNTER — Other Ambulatory Visit: Payer: Self-pay

## 2023-01-08 DIAGNOSIS — D509 Iron deficiency anemia, unspecified: Secondary | ICD-10-CM | POA: Diagnosis not present

## 2023-01-08 DIAGNOSIS — I824Z1 Acute embolism and thrombosis of unspecified deep veins of right distal lower extremity: Secondary | ICD-10-CM

## 2023-01-08 DIAGNOSIS — Z79899 Other long term (current) drug therapy: Secondary | ICD-10-CM | POA: Diagnosis not present

## 2023-01-08 DIAGNOSIS — I82411 Acute embolism and thrombosis of right femoral vein: Secondary | ICD-10-CM | POA: Insufficient documentation

## 2023-01-08 LAB — CBC WITH DIFFERENTIAL (CANCER CENTER ONLY)
Abs Immature Granulocytes: 0.01 10*3/uL (ref 0.00–0.07)
Basophils Absolute: 0.1 10*3/uL (ref 0.0–0.1)
Basophils Relative: 1 %
Eosinophils Absolute: 0.4 10*3/uL (ref 0.0–0.5)
Eosinophils Relative: 6 %
HCT: 38.2 % (ref 36.0–46.0)
Hemoglobin: 11.6 g/dL — ABNORMAL LOW (ref 12.0–15.0)
Immature Granulocytes: 0 %
Lymphocytes Relative: 38 %
Lymphs Abs: 2.6 10*3/uL (ref 0.7–4.0)
MCH: 22.4 pg — ABNORMAL LOW (ref 26.0–34.0)
MCHC: 30.4 g/dL (ref 30.0–36.0)
MCV: 73.9 fL — ABNORMAL LOW (ref 80.0–100.0)
Monocytes Absolute: 0.5 10*3/uL (ref 0.1–1.0)
Monocytes Relative: 7 %
Neutro Abs: 3.2 10*3/uL (ref 1.7–7.7)
Neutrophils Relative %: 48 %
Platelet Count: 287 10*3/uL (ref 150–400)
RBC: 5.17 MIL/uL — ABNORMAL HIGH (ref 3.87–5.11)
RDW: 16.6 % — ABNORMAL HIGH (ref 11.5–15.5)
WBC Count: 6.7 10*3/uL (ref 4.0–10.5)
nRBC: 0 % (ref 0.0–0.2)

## 2023-01-08 LAB — IRON AND IRON BINDING CAPACITY (CC-WL,HP ONLY)
Iron: 46 ug/dL (ref 28–170)
Saturation Ratios: 15 % (ref 10.4–31.8)
TIBC: 309 ug/dL (ref 250–450)
UIBC: 263 ug/dL (ref 148–442)

## 2023-01-08 LAB — BASIC METABOLIC PANEL - CANCER CENTER ONLY
Anion gap: 7 (ref 5–15)
BUN: 12 mg/dL (ref 6–20)
CO2: 30 mmol/L (ref 22–32)
Calcium: 9.1 mg/dL (ref 8.9–10.3)
Chloride: 104 mmol/L (ref 98–111)
Creatinine: 0.79 mg/dL (ref 0.44–1.00)
GFR, Estimated: >60 mL/min (ref >60)
Glucose, Bld: 99 mg/dL (ref 70–99)
Potassium: 3.4 mmol/L — ABNORMAL LOW (ref 3.5–5.1)
Sodium: 141 mmol/L (ref 135–145)

## 2023-01-08 LAB — FERRITIN: Ferritin: 34 ng/mL (ref 11–307)

## 2023-01-08 LAB — D-DIMER, QUANTITATIVE: D-Dimer, Quant: 1.07 ug/mL-FEU — ABNORMAL HIGH (ref 0.00–0.50)

## 2023-01-10 ENCOUNTER — Encounter: Payer: Self-pay | Admitting: Hematology and Oncology

## 2023-01-10 ENCOUNTER — Other Ambulatory Visit: Payer: Self-pay

## 2023-01-10 ENCOUNTER — Inpatient Hospital Stay (HOSPITAL_BASED_OUTPATIENT_CLINIC_OR_DEPARTMENT_OTHER): Payer: Medicare HMO | Admitting: Hematology and Oncology

## 2023-01-10 VITALS — BP 139/81 | HR 94 | Temp 98.3°F | Wt 207.0 lb

## 2023-01-10 DIAGNOSIS — I824Z1 Acute embolism and thrombosis of unspecified deep veins of right distal lower extremity: Secondary | ICD-10-CM

## 2023-01-10 DIAGNOSIS — D509 Iron deficiency anemia, unspecified: Secondary | ICD-10-CM

## 2023-01-10 MED ORDER — APIXABAN 2.5 MG PO TABS: 2.5000 mg | ORAL_TABLET | Freq: Two times a day (BID) | ORAL | Status: DC

## 2023-01-10 NOTE — Progress Notes (Signed)
[facility] OFFICE PROGRESS NOTE  [name], Christine Rothman, MD  ASSESSMENT & PLAN:  Acute deep vein thrombosis (DVT) of distal vein of right lower extremity ([facility]) We have extensive discussions about the role of continuing anticoagulation therapy She is concerned about risk of recurrent blood clots due to strong family history of blood clots, even though she has never been diagnosed with thrombophilia disorder We discussed the role of extended duration treatment of apixaban for secondary prevention  I explain to the patient the rational to consider extended duration of treatment with low dose Apixaban. This is based on a study published in NEJM  Apixaban for Extended Treatment of Venous Thromboembolism Christine Bradley, M.D., Christine Bradley, M.D., Ph.D., Christine Bradley, M.D., Christine Bradley, D.V.M., Christine Bradley, M.D., Christine Bradley, M.D., Christine Bradley, Ph.D., Pharm.D., Christine Bradley, Ph.D., and Christine Bradley. Christine Bradley, M.D., for the [employer] Investigators* Christine Bradley; [id number] 21, 2013DOI: 10.1056/[id number]  Methods In this randomized, double-blind study, we compared two doses of apixaban (2.5 mg and 5 mg, twice daily) with placebo in patients with venous thromboembolism who had completed 6 to 12 months of anticoagulation therapy and for whom there was clinical equipoise regarding the continuation or cessation of anticoagulation therapy. The study drugs were administered for 12 months  Results A total of 2486 patients underwent randomization, of whom 2482 were included in the intention-to-treat analyses. Symptomatic recurrent venous thromboembolism or death from venous thromboembolism occurred in 73 of the 829 patients (8.8%) who were receiving placebo, as compared with 14 of the 840 patients (1.7%) who were receiving 2.5 mg of apixaban (a difference of 7.2 percentage points; 95% confidence interval [CI], 5.0 to 9.3) and 14 of the 813 patients (1.7%) who  were receiving 5 mg of apixaban (a difference of 7.0 percentage points; 95% CI, 4.9 to 9.1) (P<0.001 for both comparisons). The rates of major bleeding were 0.5% in the placebo group, 0.2% in the 2.5-mg apixaban group, and 0.1% in the 5-mg apixaban group. The rates of clinically relevant nonmajor bleeding were 2.3% in the placebo group, 3.0% in the 2.5-mg apixaban group, and 4.2% in the 5-mg apixaban group. The rate of death from any cause was 1.7% in the placebo group, as compared with 0.8% in the 2.5-mg apixaban group and 0.5% in the 5-mg apixaban group.  Conclusions Extended anticoagulation with apixaban at either a treatment dose (5 mg) or a thromboprophylactic dose (2.5 mg) reduced the risk of recurrent venous thromboembolism without increasing the rate of major bleeding.  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   The total time spent in the appointment was 30 minutes encounter with patients including review of chart and various tests results, discussions about plan of care and coordination of care plan   All questions were answered. The patient knows to call the clinic with any problems, questions or concerns. No barriers to learning was detected.    Christine Bradley  Christine Ruddy, MD 4/18/20249:47 AM  INTERVAL HISTORY: Christine Bradley 57 y.o. female returns for review of test results and  discussion regarding the role of extended duration anticoagulation therapy Her mobility is fair She does not drink much water daily; on average, she drink about 2 to 3 cups/day No recent bleeding complications from anticoagulation therapy  SUMMARY OF HEMATOLOGIC HISTORY:  The patient had chronic degenerative joint disease and underwent right knee surgery on November 07, 2021 10 days after surgery, she developed right lower extremity pain and swelling Venous Doppler ultrasound showed: RIGHT:  - Findings consistent with acute deep vein thrombosis involving the right common femoral vein, and right femoral vein.  - No cystic structure found in the popliteal fossa.  - Common femoral vein obstruction doesn't appear to extend above inguinal ligament.     LEFT:  - No evidence of deep vein thrombosis in the lower extremity. No indirect evidence of obstruction proximal to the inguinal ligament.  - No cystic structure found in the popliteal fossa.     She was placed on Xarelto and is doing well She is compliant taking anticoagulation therapy She has no bleeding complications She denies further right lower extremity pain or swelling She is using elastic compression hose on a regular basis She had numerous surgeries in the past including right shoulder surgery, hysterectomy and others  She has also taken birth control pill for close to 15 years without complications  She denies recent chest pain on exertion, shortness of breath on minimal exertion, pre-syncopal episodes, hemoptysis, or palpitation.  She had no prior history or diagnosis of cancer. Her age appropriate screening programs are up-to-date. She had prior surgeries before and never had perioperative thromboembolic events.  The patient had been pregnant before and denies history of peripartum thromboembolic event; she has 2 early trimester miscarriages. There is no family history of blood clots or miscarriages.  US venous Doppler  02/15/22 BILATERAL: - No evidence of deep vein thrombosis seen in the lower extremities, bilaterally. -No evidence of popliteal cyst, bilaterally. RIGHT: - Findings suggest resolution of previously noted thrombus. Patient is placed on 6 months of anticoagulation therapy with Eliquis; she discontinue Eliquis prior to surgery and then resume after surgery  I have reviewed the past medical history, past surgical history, social history and family history with the patient and they are unchanged from previous note.  ALLERGIES:  is allergic to xarelto [rivaroxaban], albuterol, hydrocodone-acetaminophen, maxalt [rizatriptan], penicillins, and voltaren [diclofenac sodium].  MEDICATIONS:  Current Outpatient Medications  Medication Sig Dispense Refill   atorvastatin (LIPITOR) 40 MG tablet Take 40 mg by mouth daily.     amLODipine (NORVASC) 2.5 MG tablet Take 2.5 mg by mouth daily.     apixaban (ELIQUIS) 2.5 MG TABS tablet Take 1 tablet (2.5 mg total) by mouth every 12 (twelve) hours.     cetirizine (ZYRTEC) 10 MG tablet Take 10 mg by mouth in the morning.     Cholecalciferol (VITAMIN D) 2000 units tablet Take 4,000 Units by mouth daily.     cyclobenzaprine (FLEXERIL) 10 MG tablet Take 1 tablet (10 mg total) by mouth 3 (three) times daily as needed for muscle spasms. 30 tablet 2   famotidine (PEPCID) 40 MG tablet Take 40 mg by mouth at bedtime.     fluticasone (FLONASE) 50 MCG/ACT nasal spray Place 2 sprays into both nostrils daily as needed for allergies.     Fluticasone-Salmeterol (ADVAIR) 250-50 MCG/DOSE AEPB Inhale 1 puff into the lungs 2 (two) times  daily.     folic acid (FOLVITE) 1 MG tablet Take 1 mg by mouth in the morning.     ipratropium (ATROVENT HFA) 17 MCG/ACT inhaler Inhale 2 puffs into the lungs every 6 (six) hours as needed for wheezing.      linaclotide (LINZESS) 72 MCG capsule Take 72 mcg by mouth daily before breakfast.     montelukast (SINGULAIR) 10 MG tablet Take 10 mg by mouth in  the morning.     Multiple Vitamins-Minerals (HAIR/SKIN/NAILS PO) Take 2 tablets by mouth every morning.      Multiple Vitamins-Minerals (MULTIVITAMIN GUMMIES ADULT PO) Take 2 each by mouth every morning.     oxybutynin (DITROPAN) 5 MG tablet Take 5 mg by mouth at bedtime.     pantoprazole (PROTONIX) 40 MG tablet Take 40 mg by mouth daily before breakfast.      Polyethyl Glycol-Propyl Glycol 0.4-0.3 % SOLN Place 1 drop into both eyes in the morning.     pregabalin (LYRICA) 150 MG capsule Take 150 mg by mouth at bedtime.      Secukinumab (COSENTYX) 150 MG/ML SOSY Inject 150 mg into the skin every 30 (thirty) days.     venlafaxine XR (EFFEXOR-XR) 150 MG 24 hr capsule Take 150 mg by mouth daily.     vitamin C (ASCORBIC ACID) 250 MG tablet Take 500 mg by mouth daily.     No current facility-administered medications for this visit.     REVIEW OF SYSTEMS:   Constitutional: Denies fevers, chills or night sweats Eyes: Denies blurriness of vision Ears, nose, mouth, throat, and face: Denies mucositis or sore throat Respiratory: Denies cough, dyspnea or wheezes Cardiovascular: Denies palpitation, chest discomfort or lower extremity swelling Gastrointestinal:  Denies nausea, heartburn or change in bowel habits Skin: Denies abnormal skin rashes Lymphatics: Denies new lymphadenopathy or easy bruising Neurological:Denies numbness, tingling or new weaknesses Behavioral/Psych: Mood is stable, no new changes  All other systems were reviewed with the patient and are negative.  PHYSICAL EXAMINATION: ECOG PERFORMANCE STATUS: 0 - Asymptomatic  Vitals:   01/10/23 0850  BP: 139/81  Pulse: 94  Temp: 98.3 F (36.8 C)  SpO2: 100%   Filed Weights   01/10/23 0850  Weight: 207 lb (93.9 kg)    GENERAL:alert, no distress and comfortable  NEURO: alert & oriented x 3 with fluent speech, no focal motor/sensory deficits  LABORATORY DATA:  I have reviewed the data as listed     Component Value Date/Time    NA 141 01/08/2023 0840   K 3.4 (L) 01/08/2023 0840   CL 104 01/08/2023 0840   CO2 30 01/08/2023 0840   GLUCOSE 99 01/08/2023 0840   BUN 12 01/08/2023 0840   CREATININE 0.79 01/08/2023 0840   CALCIUM 9.1 01/08/2023 0840   PROT 7.2 11/07/2021 1223   ALBUMIN 3.6 11/07/2021 1223   AST 20 11/07/2021 1223   ALT 15 11/07/2021 1223   ALKPHOS 68 11/07/2021 1223   BILITOT 0.5 11/07/2021 1223   GFRNONAA >60 01/08/2023 0840   GFRAA >60 04/13/2018 1926    No results found for: "SPEP", "UPEP"  Lab Results  Component Value Date   WBC 6.7 01/08/2023   NEUTROABS 3.2 01/08/2023   HGB 11.6 (L) 01/08/2023   HCT 38.2 01/08/2023   MCV 73.9 (L) 01/08/2023   PLT 287 01/08/2023      Chemistry      Component Value Date/Time   NA 141 01/08/2023 0840   K 3.4 (L) 01/08/2023 0840  CL 104 01/08/2023 0840   CO2 30 01/08/2023 0840   BUN 12 01/08/2023 0840   CREATININE 0.79 01/08/2023 0840      Component Value Date/Time   CALCIUM 9.1 01/08/2023 0840   ALKPHOS 68 11/07/2021 1223   AST 20 11/07/2021 1223   ALT 15 11/07/2021 1223   BILITOT 0.5 11/07/2021 1223

## 2023-01-10 NOTE — Assessment & Plan Note (Signed)
We have extensive discussions about the role of continuing anticoagulation therapy She is concerned about risk of recurrent blood clots due to strong family history of blood clots, even though she has never been diagnosed with thrombophilia disorder We discussed the role of extended duration treatment of apixaban for secondary prevention  I explain to the patient the rational to consider extended duration of treatment with low dose Apixaban. This is based on a study published in NEJM  Apixaban for Extended Treatment of Venous Thromboembolism Chrisandra Netters, M.D., Judith Part, M.D., Ph.D., Montey Hora, M.D., Estill Batten, D.V.M., Oliver Barre, M.D., Rolla Flatten, M.D., Lavone Orn, Ph.D., Pharm.D., Buckner Malta, Ph.D., and Letta Median. Jens Som, M.D., for the [employer] Investigators* Malva Limes Med 2013; [id number] 21, 2013DOI: 10.1056/[id number]  Methods In this randomized, double-blind study, we compared two doses of apixaban (2.5 mg and 5 mg, twice daily) with placebo in patients with venous thromboembolism who had completed 6 to 12 months of anticoagulation therapy and for whom there was clinical equipoise regarding the continuation or cessation of anticoagulation therapy. The study drugs were administered for 12 months  Results A total of 2486 patients underwent randomization, of whom 2482 were included in the intention-to-treat analyses. Symptomatic recurrent venous thromboembolism or death from venous thromboembolism occurred in 73 of the 829 patients (8.8%) who were receiving placebo, as compared with 14 of the 840 patients (1.7%) who were receiving 2.5 mg of apixaban (a difference of 7.2 percentage points; 95% confidence interval [CI], 5.0 to 9.3) and 14 of the 813 patients (1.7%) who were receiving 5 mg of apixaban (a difference of 7.0 percentage points; 95% CI, 4.9 to 9.1) (P<0.001 for both comparisons). The rates of major bleeding were 0.5% in the placebo  group, 0.2% in the 2.5-mg apixaban group, and 0.1% in the 5-mg apixaban group. The rates of clinically relevant nonmajor bleeding were 2.3% in the placebo group, 3.0% in the 2.5-mg apixaban group, and 4.2% in the 5-mg apixaban group. The rate of death from any cause was 1.7% in the placebo group, as compared with 0.8% in the 2.5-mg apixaban group and 0.5% in the 5-mg apixaban group.  Conclusions Extended anticoagulation with apixaban at either a treatment dose (5 mg) or a thromboprophylactic dose (2.5 mg) reduced the risk of recurrent venous thromboembolism without increasing the rate of major bleeding.  Ultimately, she would like to continue on extended duration apixaban at reduced dose for secondary prevention As she plans to remain on apixaban long-term, there is no role for thrombophilia testing She does not need return follow-up in the future unless she need perioperative anticoagulation management

## 2023-01-10 NOTE — Assessment & Plan Note (Signed)
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

## 2023-02-12 ENCOUNTER — Encounter: Payer: Self-pay | Admitting: Family Medicine

## 2023-02-12 DIAGNOSIS — Z9189 Other specified personal risk factors, not elsewhere classified: Secondary | ICD-10-CM

## 2023-02-13 ENCOUNTER — Other Ambulatory Visit: Payer: Self-pay | Admitting: Family Medicine

## 2023-02-13 DIAGNOSIS — Z9189 Other specified personal risk factors, not elsewhere classified: Secondary | ICD-10-CM

## 2023-02-16 ENCOUNTER — Emergency Department (HOSPITAL_BASED_OUTPATIENT_CLINIC_OR_DEPARTMENT_OTHER)
Admission: EM | Admit: 2023-02-16 | Discharge: 2023-02-16 | Disposition: A | Discharge status: Home / Self Care | Payer: Medicare HMO | Attending: Emergency Medicine | Admitting: Emergency Medicine

## 2023-02-16 ENCOUNTER — Encounter (HOSPITAL_BASED_OUTPATIENT_CLINIC_OR_DEPARTMENT_OTHER): Payer: Self-pay | Admitting: Emergency Medicine

## 2023-02-16 ENCOUNTER — Other Ambulatory Visit: Payer: Self-pay

## 2023-02-16 DIAGNOSIS — M545 Low back pain, unspecified: Secondary | ICD-10-CM | POA: Diagnosis present

## 2023-02-16 DIAGNOSIS — M5441 Lumbago with sciatica, right side: Secondary | ICD-10-CM | POA: Insufficient documentation

## 2023-02-16 DIAGNOSIS — M5416 Radiculopathy, lumbar region: Secondary | ICD-10-CM

## 2023-02-16 DIAGNOSIS — Z7901 Long term (current) use of anticoagulants: Secondary | ICD-10-CM | POA: Insufficient documentation

## 2023-02-16 DIAGNOSIS — M5431 Sciatica, right side: Secondary | ICD-10-CM

## 2023-02-16 MED ORDER — OXYCODONE HCL 5 MG PO TABS: 5.0000 mg | ORAL_TABLET | Freq: Two times a day (BID) | ORAL | 0 refills | Status: AC | PRN

## 2023-02-16 MED ORDER — NAPROXEN 250 MG PO TABS
250.0000 mg | ORAL_TABLET | Freq: Once | ORAL | Status: AC
  Administered 2023-02-16: 250 mg via ORAL
  Filled 2023-02-16: qty 1

## 2023-02-16 MED ORDER — TIZANIDINE HCL 4 MG PO CAPS: 4.0000 mg | ORAL_CAPSULE | Freq: Three times a day (TID) | ORAL | 0 refills | Status: DC

## 2023-02-16 MED ORDER — PREDNISONE 10 MG (21) PO TBPK: ORAL_TABLET | Freq: Every day | ORAL | 0 refills | Status: DC

## 2023-02-16 MED ORDER — ACETAMINOPHEN 325 MG PO TABS
650.0000 mg | ORAL_TABLET | Freq: Once | ORAL | Status: AC
  Administered 2023-02-16: 650 mg via ORAL
  Filled 2023-02-16: qty 2

## 2023-02-16 MED ORDER — ACETAMINOPHEN 500 MG PO TABS: 500.0000 mg | ORAL_TABLET | Freq: Four times a day (QID) | ORAL | 0 refills | Status: DC | PRN

## 2023-02-16 NOTE — Discharge Instructions (Addendum)
We suspect that you are having nerve impingement causing the pain.  As discussed, primary treatment modalities stretching exercises.  Rehab exercises have been provided.  Additionally hot and cold compresses can help. Please take the medications prescribed for pain control.  Take the narcotic medicine only if the pain is excruciating.  We recommend that he follow-up with your primary care doctor in 5 to 7 days for reassessment.

## 2023-02-16 NOTE — ED Provider Notes (Signed)
[place] EMERGENC[facility] DEPARTMENT AT [facility] Provider Note   CSN: [id number] Arrival date & time: 02/16/23  1931     History  Chief Complaint  Patient presents with   Back Pain    Christine Bradley is a 57 y.o. female.  HPI    57 year old female comes in with chief complaint of back pain.  Patient has history of osteoarthritis, fibromyalgia and also sciatica.  She states that she started having her current back pain about a month ago.  However, over the last couple of days she has had pain radiating down her right leg and some numbness in the right leg.  Pt has no associated bilateral numbness, weakness, urinary incontinence, urinary retention, bowel incontinence, pins and needle sensation in the perineal area.  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  She has taken over-the-counter pain medications without relief.  Home Medications Prior to Admission medications   Medication Sig Start Date End Date Taking? Authorizing Provider  acetaminophen (TYLENOL) 500 MG tablet Take 1 tablet (500 mg total) by mouth every 6 (six) hours as needed. 02/16/23  Yes Derwood Kaplan, MD  oxyCODONE (OXY IR/ROXICODONE) 5 MG immediate release tablet Take 1 tablet (5 mg total) by mouth every 12 (twelve) hours as needed for up to 3 days for severe pain. 02/16/23 02/19/23 Yes Miking Usrey, MD  predniSONE (STERAPRED UNI-PAK 21 TAB) 10 MG (21) TBPK tablet Take by mouth daily. Take 6 tabs by mouth daily  for 2 days, then 5 tabs for 2 days, then 4 tabs for 2 days, then 3 tabs for 2 days, 2 tabs for 2 days, then 1 tab by mouth daily for 2 days 02/16/23  Yes Jannelly Bergren, MD  tiZANidine (ZANAFLEX) 4 MG capsule Take 1 capsule (4 mg total) by mouth 3 (three) times daily. 02/16/23  Yes Kaytlyn Din, MD  amLODipine (NORVASC) 2.5 MG tablet Take 2.5 mg by mouth daily. 06/25/22   [provider]  apixaban (ELIQUIS) 2.5 MG TABS tablet Take 1 tablet (2.5 mg total) by mouth every 12 (twelve)  hours. 01/10/23   Artis Delay, MD  atorvastatin (LIPITOR) 40 MG tablet Take 40 mg by mouth daily.    [provider]  cetirizine (ZYRTEC) 10 MG tablet Take 10 mg by mouth in the morning.    [provider]  Cholecalciferol (VITAMIN D) 2000 units tablet Take 4,000 Units by mouth daily.    [provider]  cyclobenzaprine (FLEXERIL) 10 MG tablet Take 1 tablet (10 mg total) by mouth 3 (three) times daily as needed for muscle spasms. 07/11/22   Cassandria Anger, PA-C  famotidine (PEPCID) 40 MG tablet Take 40 mg by mouth at bedtime.    [provider]  fluticasone (FLONASE) 50 MCG/ACT nasal spray Place 2 sprays into both nostrils daily as needed for allergies.    [provider]  Fluticasone-Salmeterol (ADVAIR) 250-50 MCG/DOSE AEPB Inhale 1 puff into the lungs 2 (two) times daily.    [provider]  folic acid (FOLVITE) 1 MG tablet Take 1 mg by mouth in the morning.    [provider]  ipratropium (ATROVENT HFA) 17 MCG/ACT inhaler Inhale 2 puffs into the lungs every 6 (six) hours as needed for wheezing.     Carolin Coy, MD  linaclotide [facility]) 72 MCG capsule Take 72 mcg by mouth daily before breakfast.    [provider]  montelukast (SINGULAIR) 10 MG tablet Take 10 mg by mouth in the morning.  Dow Adolph, FNP  Multiple Vitamins-Minerals (HAIR/SKIN/NAILS PO) Take 2 tablets by mouth every morning.     [provider]  Multiple Vitamins-Minerals (MULTIVITAMIN GUMMIES ADULT PO) Take 2 each by mouth every morning.    [provider]  oxybutynin (DITROPAN) 5 MG tablet Take 5 mg by mouth at bedtime. 06/20/22   [provider]  pantoprazole (PROTONIX) 40 MG tablet Take 40 mg by mouth daily before breakfast.  11/03/14 08/11/24  [provider]  Polyethyl Glycol-Propyl Glycol 0.4-0.3 % SOLN Place 1 drop into both eyes in the morning.    [provider]  pregabalin (LYRICA) 150 MG  capsule Take 150 mg by mouth at bedtime.     [provider]  Secukinumab (COSENTYX) 150 MG/ML SOSY Inject 150 mg into the skin every 30 (thirty) days.    [provider]  venlafaxine XR (EFFEXOR-XR) 150 MG 24 hr capsule Take 150 mg by mouth daily. 10/06/21   [provider]  vitamin C (ASCORBIC ACID) 250 MG tablet Take 500 mg by mouth daily.    [provider]      Allergies    Xarelto [rivaroxaban], Albuterol, Hydrocodone-acetaminophen, Maxalt [rizatriptan], Penicillins, and Voltaren [diclofenac sodium]    Review of Systems   Review of Systems  All other systems reviewed and are negative.   Physical Exam Updated Vital Signs BP (!) 144/91   Pulse 90   Temp 98.4 F (36.9 C) (Oral)   Resp 18   SpO2 100%  Physical Exam Vitals and nursing note reviewed.  Constitutional:      Appearance: She is well-developed.  HENT:     Head: Normocephalic and atraumatic.  Eyes:     Extraocular Movements: Extraocular movements intact.  Cardiovascular:     Rate and Rhythm: Normal rate.  Pulmonary:     Effort: Pulmonary effort is normal.  Musculoskeletal:     Cervical back: Normal range of motion and neck supple.     Comments: Pt has tenderness over the lumbar region No step offs, no erythema. Able to discriminate between sharp and dull, but patient has subjective paresthesias below her knee on the right side Able to ambulate   Skin:    General: Skin is dry.  Neurological:     Mental Status: She is alert and oriented to person, place, and time.     Comments: Unable to elicit bilateral patellar reflex, patient has history of knee replacement bilaterally     ED Results / Procedures / Treatments   Labs (all labs ordered are listed, but only abnormal results are displayed) Labs Reviewed - No data to display  EKG None  Radiology No results found.  Procedures Procedures    Medications Ordered in ED Medications  acetaminophen (TYLENOL) tablet  650 mg (650 mg Oral Given 02/16/23 2127)  naproxen (NAPROSYN) tablet 250 mg (250 mg Oral Given 02/16/23 2127)    ED Course/ Medical Decision Making/ A&P                             Medical Decision Making Risk OTC drugs. Prescription drug management.   57 year old female comes in with chief complaint of back pain.  She has had back pain for the last month, it has been manageable but over the last few days the pain is worsened and now she is having pain radiating down her right lower extremity.  She also has tingling sensation in the right leg below the  knee.  She has history of osteoarthritis, sciatica.  No red flags on exam suggesting cord compression, cauda equina.  I have reviewed patient's chart.  She had x-ray of her lumbar spine completed in November 2022 and it revealed multilevel degenerative disc disease.  I suspect that is what is triggering her pain right now, possibly radicular component to it.  Differential diagnosis for this patient includes degenerative spine disease, spinal stenosis, sciatica.   Pain is not better when she is sitting up.  She does have worsening of her symptoms with leg raise.  Suspect impingement syndrome at this time.  She is on blood thinners, therefore we will not start her on any NSAID.  Will give her oxycodone for severe pain only and put her on prednisone for anti-inflammatory effects.  Final Clinical Impression(s) / ED Diagnoses Final diagnoses:  Sciatica of right side  Lumbar radiculopathy    Rx / DC Orders ED Discharge Orders          Ordered    predniSONE (STERAPRED UNI-PAK 21 TAB) 10 MG (21) TBPK tablet  Daily        02/16/23 2106    acetaminophen (TYLENOL) 500 MG tablet  Every 6 hours PRN        02/16/23 2106    tiZANidine (ZANAFLEX) 4 MG capsule  3 times daily        02/16/23 2106    oxyCODONE (OXY IR/ROXICODONE) 5 MG immediate release tablet  Every 12 hours PRN        02/16/23 2108              Derwood Kaplan,  MD 02/16/23 2154

## 2023-02-16 NOTE — ED Triage Notes (Signed)
Pt presents to ED POV. Pt c/o lower left back x85m. Pt reports that she was getting relief w/ fibromyalgia pain meds and/or otc pain meds but since last night nothing has helped. Denies GU s/s

## 2023-02-16 NOTE — ED Notes (Signed)
Reviewed AVS with patient, patient expressed understanding of directions, denies further questions at this time. 

## 2023-05-16 ENCOUNTER — Ambulatory Visit: Payer: 59 | Admitting: Orthopaedic Surgery

## 2023-05-16 ENCOUNTER — Encounter: Payer: Self-pay | Admitting: Orthopaedic Surgery

## 2023-05-16 ENCOUNTER — Other Ambulatory Visit (INDEPENDENT_AMBULATORY_CARE_PROVIDER_SITE_OTHER): Payer: 59

## 2023-05-16 VITALS — Ht 64.0 in | Wt 210.0 lb

## 2023-05-16 DIAGNOSIS — M1612 Unilateral primary osteoarthritis, left hip: Secondary | ICD-10-CM

## 2023-05-16 NOTE — Progress Notes (Signed)
Office Visit Note   Patient: Christine Bradley           Date of Birth: September 22, 1966           MRN: [id number] Visit Date: 05/16/2023              Requested by: Macy Mis, MD [address] [place],  [place] [address] PCP: Macy Mis, MD   Assessment & Plan: Visit Diagnoses:  1. Primary osteoarthritis of left hip     Plan: Xochi is a 57 year old female with end-stage left hip DJD.  She has not found any relief from cortisone injections to the left hip and physical therapy only gives temporary relief.  She has done well from both knee replacements and the right hip replacement.  Based on her treatment options she would like to move forward with scheduling for a left total hip replacement.  Eunice Blase will contact the patient to confirm surgery time.  She will stop the Cosentyx and Eliquis at the appropriate times that we discussed.  Follow-Up Instructions: No follow-ups on file.   Orders:  Orders Placed This Encounter  Procedures   XR Pelvis 1-2 Views   No orders of the defined types were placed in this encounter.     Procedures: No procedures performed   Clinical Data: No additional findings.   Subjective: Chief Complaint  Patient presents with   Left Hip - Pain    HPI Eithel is a very pleasant 57 year old female here for evaluation of chronic left hip pain.  She has psoriatic arthritis, rheumatoid arthritis and osteoarthritis.  She has a sister of Mickelle Roloff who is a longtime patient of mine.  She reports that deep hip pain and groin pain.  This affects her quality of life and ADLs significantly.  She has undergone bilateral knee replacements and a right hip replacement by Dr. Constance Goltz.  She is on chronic Eliquis for a postoperative DVT.  Takes Cosentyx for her rheumatologic conditions. Review of Systems  Constitutional: Negative.   HENT: Negative.    Eyes: Negative.   Respiratory: Negative.    Cardiovascular: Negative.   Endocrine: Negative.    Musculoskeletal: Negative.   Neurological: Negative.   Hematological: Negative.   Psychiatric/Behavioral: Negative.    All other systems reviewed and are negative.    Objective: Vital Signs: Ht 5\' 4"  (1.626 m)   Wt 210 lb (95.3 kg)   BMI 36.05 kg/m   Physical Exam Vitals and nursing note reviewed.  Constitutional:      Appearance: She is well-developed.  HENT:     Head: Atraumatic.     Nose: Nose normal.  Eyes:     Extraocular Movements: Extraocular movements intact.  Cardiovascular:     Pulses: Normal pulses.  Pulmonary:     Effort: Pulmonary effort is normal.  Abdominal:     Palpations: Abdomen is soft.  Musculoskeletal:     Cervical back: Neck supple.  Skin:    General: Skin is warm.     Capillary Refill: Capillary refill takes less than 2 seconds.  Neurological:     Mental Status: She is alert. Mental status is at baseline.  Psychiatric:        Behavior: Behavior normal.        Thought Content: Thought content normal.        Judgment: Judgment normal.     Ortho Exam Examination of the left hip shows slight trochanteric tenderness.  She has significant pain with  internal rotation and hip flexion.  Positive Stinchfield sign.  No sciatic tension signs. Specialty Comments:  No specialty comments available.  Imaging: XR Pelvis 1-2 Views  Result Date: 05/16/2023 X-rays demonstrate a stable right total hip replacement without any complications.  Degenerative findings to the left hip joint with joint space narrowing.    PMFS History: Patient Active Problem List   Diagnosis Date Noted   S/P total knee arthroplasty, left 07/10/2022   Microcytic anemia 02/20/2022   Acute deep vein thrombosis (DVT) of distal vein of right lower extremity (HCC) 12/08/2021   S/P total knee arthroplasty, right 11/07/2021   S/P total right hip arthroplasty 08/29/2021   Past Medical History:  Diagnosis Date   Anxiety    Arthritis    Carpal tunnel syndrome    Chronic bronchitis  (HCC)    Chronic kidney disease    COPD (chronic obstructive pulmonary disease) (HCC)    Fibromyalgia    GERD (gastroesophageal reflux disease)    Headache    Chronic migraines   Hypertension    Hyperthyroidism    IBS (irritable bowel syndrome)    IC (interstitial cystitis)    Neuromuscular disorder (HCC)    fibromyalgia   Pneumonia 2001   hx of x 2   Pre-diabetes    Psoriatic arthritis (HCC)     Family History  Problem Relation Age of Onset   Arthritis Mother    Breast cancer Mother 82    Past Surgical History:  Procedure Laterality Date   ABDOMINAL HYSTERECTOMY  2004   BREAST EXCISIONAL BIOPSY Left 12/31/2017   foot surgery  1997   RADIOACTIVE SEED GUIDED EXCISIONAL BREAST BIOPSY Left 12/31/2017   Procedure: LEFT BREAST SEED GUIDED EXCISIONAL BIOPSY;  Surgeon: Abigail Miyamoto, MD;  Location: [facility] OR;  Service: General;  Laterality: Left;   right shoulder repair Right    TOTAL HIP ARTHROPLASTY Right 08/29/2021   Procedure: TOTAL HIP ARTHROPLASTY ANTERIOR APPROACH;  Surgeon: Durene Romans, MD;  Location: [facility] ORS;  Service: Orthopedics;  Laterality: Right;   TOTAL KNEE ARTHROPLASTY Right 11/07/2021   Procedure: TOTAL KNEE ARTHROPLASTY;  Surgeon: Durene Romans, MD;  Location: [facility] ORS;  Service: Orthopedics;  Laterality: Right;   TOTAL KNEE ARTHROPLASTY Left 07/10/2022   Procedure: TOTAL KNEE ARTHROPLASTY;  Surgeon: Durene Romans, MD;  Location: [facility] ORS;  Service: Orthopedics;  Laterality: Left;   WISDOM TOOTH EXTRACTION     Social History   Occupational History   Occupation: disability  Tobacco Use   Smoking status: Never   Smokeless tobacco: Never  Vaping Use   Vaping status: Never Used  Substance and Sexual Activity   Alcohol use: Yes    Alcohol/week: 1.0 standard drink of alcohol    Types: 1 Glasses of wine per week   Drug use: Yes    Types: Marijuana    Comment: 1-2 x per month for help with pain, last 07/07/22   Sexual activity: Not on file

## 2023-05-23 ENCOUNTER — Other Ambulatory Visit: Payer: Self-pay

## 2023-05-31 ENCOUNTER — Telehealth: Payer: Self-pay

## 2023-05-31 NOTE — Telephone Encounter (Signed)
Called and regarding request from pharmacy for refill Eliquis. Offered appt with Dr. Bertis Ruddy or she can ask PCP. She will ask PCP for refill.

## 2023-07-01 ENCOUNTER — Other Ambulatory Visit: Payer: Self-pay | Admitting: Family Medicine

## 2023-07-01 DIAGNOSIS — Z0000 Encounter for general adult medical examination without abnormal findings: Secondary | ICD-10-CM

## 2023-07-15 ENCOUNTER — Encounter (HOSPITAL_COMMUNITY)
Admission: RE | Admit: 2023-07-15 | Discharge: 2023-07-15 | Disposition: A | Discharge status: Home / Self Care | Payer: 59 | Source: Ambulatory Visit | Attending: Orthopaedic Surgery | Admitting: Orthopaedic Surgery

## 2023-07-15 ENCOUNTER — Encounter (HOSPITAL_COMMUNITY): Payer: Self-pay | Admitting: Orthopaedic Surgery

## 2023-07-15 ENCOUNTER — Encounter (HOSPITAL_COMMUNITY): Payer: Self-pay

## 2023-07-15 ENCOUNTER — Other Ambulatory Visit: Payer: Self-pay

## 2023-07-15 VITALS — BP 150/94 | HR 93 | Temp 97.8°F | Resp 17 | Ht 64.0 in | Wt 218.0 lb

## 2023-07-15 DIAGNOSIS — Z01818 Encounter for other preprocedural examination: Secondary | ICD-10-CM

## 2023-07-15 DIAGNOSIS — Z01812 Encounter for preprocedural laboratory examination: Secondary | ICD-10-CM | POA: Diagnosis present

## 2023-07-15 DIAGNOSIS — Z01810 Encounter for preprocedural cardiovascular examination: Secondary | ICD-10-CM | POA: Diagnosis present

## 2023-07-15 HISTORY — DX: Sleep apnea, unspecified: G47.30

## 2023-07-15 LAB — TYPE AND SCREEN
ABO/RH(D): A POS
Antibody Screen: NEGATIVE

## 2023-07-15 LAB — SURGICAL PCR SCREEN
MRSA, PCR: NEGATIVE
Staphylococcus aureus: NEGATIVE

## 2023-07-15 NOTE — Progress Notes (Addendum)
Surgical Instructions   Your procedure is scheduled on October 31. Report to [facility] Main Entrance "A" at 5:30 A.M., then check in with the Admitting office. Any questions or running late day of surgery: call [phone]  Questions prior to your surgery date: call [phone], Monday-Friday, 8am-4pm. If you experience any cold or flu symptoms such as cough, fever, chills, shortness of breath, etc. between now and your scheduled surgery, please notify us at the above number.     Remember:  Do not eat after midnight the night before your surgery  You may drink clear liquids until 4:30am the morning of your surgery.   Clear liquids allowed are: Water, Non-Citrus Juices (without pulp), Carbonated Beverages, Clear Tea, Black Coffee Only (NO MILK, CREAM OR POWDERED CREAMER of any kind), and Gatorade.    Take these medicines the morning of surgery with A SIP OF WATER )  amLODipine (NORVASC)  cetirizine (ZYRTEC)  esomeprazole (NEXIUM)  famotidine (PEPCID)  montelukast (SINGULAIR)  venlafaxine XR (EFFEXOR-XR)  Fluticasone-Salmeterol (ADVAIR) 250-50   May take these medicines IF NEEDED: acetaminophen (TYLENOL)  fluticasone (FLONASE)  ipratropium (ATROVENT HFA)   One week prior to surgery, STOP taking any Aspirin (unless otherwise instructed by your surgeon) Aleve, Naproxen, Ibuprofen, Motrin, Advil, Goody's, BC's, all herbal medications, fish oil, and non-prescription vitamins.           Per Dr. Warren Danes instructions hold Eliquis three days prior to surgery . Last dose of Eliquis will be October 27.            Do NOT Smoke (Tobacco/Vaping) for 24 hours prior to your procedure.  If you use a CPAP at night, you may bring your mask/headgear for your overnight stay.   You will be asked to remove any contacts, glasses, piercing's, hearing aid's, dentures/partials prior to surgery. Please bring cases for these items if needed.    Patients discharged the day of surgery will not be allowed  to drive home, and someone needs to stay with them for 24 hours.  SURGICAL WAITING ROOM VISITATION Patients may have no more than 2 support people in the waiting area - these visitors may rotate.   Pre-op nurse will coordinate an appropriate time for 1 ADULT support person, who may not rotate, to accompany patient in pre-op.  Children under the age of 66 must have an adult with them who is not the patient and must remain in the main waiting area with an adult.  If the patient needs to stay at the hospital during part of their recovery, the visitor guidelines for inpatient rooms apply.  Please refer to the [facility] website for the visitor guidelines for any additional information.   If you received a COVID test during your pre-op visit  it is requested that you wear a mask when out in public, stay away from anyone that may not be feeling well and notify your surgeon if you develop symptoms. If you have been in contact with anyone that has tested positive in the last 10 days please notify you surgeon.      Pre-operative 5 CHG Bathing Instructions   You can play a key role in reducing the risk of infection after surgery. Your skin needs to be as free of germs as possible. You can reduce the number of germs on your skin by washing with CHG (chlorhexidine gluconate) soap before surgery. CHG is an antiseptic soap that kills germs and continues to kill germs even after washing.   DO NOT  use if you have an allergy to chlorhexidine/CHG or antibacterial soaps. If your skin becomes reddened or irritated, stop using the CHG and notify one of our RNs at [phone].   Please shower with the CHG soap starting 4 days before surgery using the following schedule:     Please keep in mind the following:  DO NOT shave, including legs and underarms, starting the day of your first shower.   You may shave your face at any point before/day of surgery.  Place clean sheets on your bed the day you start using  CHG soap. Use a clean washcloth (not used since being washed) for each shower. DO NOT sleep with pets once you start using the CHG.   CHG Shower Instructions:  Wash your face and private area with normal soap. If you choose to wash your hair, wash first with your normal shampoo.  After you use shampoo/soap, rinse your hair and body thoroughly to remove shampoo/soap residue.  Turn the water OFF and apply about 3 tablespoons (45 ml) of CHG soap to a CLEAN washcloth.  Apply CHG soap ONLY FROM YOUR NECK DOWN TO YOUR TOES (washing for 3-5 minutes)  DO NOT use CHG soap on face, private areas, open wounds, or sores.  Pay special attention to the area where your surgery is being performed.  If you are having back surgery, having someone wash your back for you may be helpful. Wait 2 minutes after CHG soap is applied, then you may rinse off the CHG soap.  Pat dry with a clean towel  Put on clean clothes/pajamas   If you choose to wear lotion, please use ONLY the CHG-compatible lotions on the back of this paper.   Additional instructions for the day of surgery: DO NOT APPLY any lotions, deodorants, cologne, or perfumes.   Do not bring valuables to the hospital. [facility] is not responsible for any belongings/valuables. Do not wear nail polish, gel polish, artificial nails, or any other type of covering on natural nails (fingers and toes) Do not wear jewelry or makeup Put on clean/comfortable clothes.  Please brush your teeth.  Ask your nurse before applying any prescription medications to the skin.     CHG Compatible Lotions   Aveeno Moisturizing lotion  Cetaphil Moisturizing Cream  Cetaphil Moisturizing Lotion  Clairol Herbal Essence Moisturizing Lotion, Dry Skin  Clairol Herbal Essence Moisturizing Lotion, Extra Dry Skin  Clairol Herbal Essence Moisturizing Lotion, Normal Skin  Curel Age Defying Therapeutic Moisturizing Lotion with Alpha Hydroxy  Curel Extreme Care Body Lotion  Curel  Soothing Hands Moisturizing Hand Lotion  Curel Therapeutic Moisturizing Cream, Fragrance-Free  Curel Therapeutic Moisturizing Lotion, Fragrance-Free  Curel Therapeutic Moisturizing Lotion, Original Formula  Eucerin Daily Replenishing Lotion  Eucerin Dry Skin Therapy Plus Alpha Hydroxy Crme  Eucerin Dry Skin Therapy Plus Alpha Hydroxy Lotion  Eucerin Original Crme  Eucerin Original Lotion  Eucerin Plus Crme Eucerin Plus Lotion  Eucerin TriLipid Replenishing Lotion  Keri Anti-Bacterial Hand Lotion  Keri Deep Conditioning Original Lotion Dry Skin Formula Softly Scented  Keri Deep Conditioning Original Lotion, Fragrance Free Sensitive Skin Formula  Keri Lotion Fast Absorbing Fragrance Free Sensitive Skin Formula  Keri Lotion Fast Absorbing Softly Scented Dry Skin Formula  Keri Original Lotion  Keri Skin Renewal Lotion Keri Silky Smooth Lotion  Keri Silky Smooth Sensitive Skin Lotion  Nivea Body Creamy Conditioning Oil  Nivea Body Extra Enriched Building services engineer Sheer Moisturizing Lotion Nivea Crme  Nivea Skin Firming Lotion  NutraDerm 30 Skin Lotion  NutraDerm Skin Lotion  NutraDerm Therapeutic Skin Cream  NutraDerm Therapeutic Skin Lotion  ProShield Protective Hand Cream  Provon moisturizing lotion  Please read over the following fact sheets that you were given.

## 2023-07-15 NOTE — Progress Notes (Signed)
PCP - Delbert Harness, MD Cardiologist - denies Pulmonologist - Camelia Eng, MD   PPM/ICD - denies Device Orders -  Rep Notified -   Chest x-ray -  EKG - 07/15/23 Stress Test - deneis ECHO - denies Cardiac Cath - denies  Sleep Study - 04/2022 CPAP - yes  Fasting Blood Sugar - na Checks Blood Sugar _____ times a day  Last dose of GLP1 agonist-  na GLP1 instructions:   Blood Thinner Instructions: per Dr. Roda Shutters- hold 3 days prior to surgery. Last dose will be October 27. Aspirin Instructions:na  ERAS Protcol -clear liquids until 0430 PRE-SURGERY Ensure or G2- no  COVID TEST- na   Anesthesia review: history of post op DVT- ? Thalassemia-see hematology note 01/10/23  Patient denies shortness of breath, fever, cough and chest pain at PAT appointment   All instructions explained to the patient, with a verbal understanding of the material. Patient agrees to go over the instructions while at home for a better understanding. Patient also instructed to wear a mask when out in public prior to surgery. The opportunity to ask questions was provided.

## 2023-07-16 NOTE — Anesthesia Preprocedure Evaluation (Addendum)
Anesthesia Evaluation  Patient identified by MRN, date of birth, ID band Patient awake    Reviewed: Allergy & Precautions, NPO status , Patient's Chart, lab work & pertinent test results, reviewed documented beta blocker date and time   History of Anesthesia Complications Negative for: history of anesthetic complications  Airway Mallampati: II  TM Distance: >3 FB Neck ROM: Full    Dental no notable dental hx. (+) Dental Advisory Given   Pulmonary sleep apnea and Continuous Positive Airway Pressure Ventilation , pneumonia, resolved, COPD,  COPD inhaler, Patient abstained from smoking.   Pulmonary exam normal breath sounds clear to auscultation       Cardiovascular hypertension, Pt. on medications + DVT  Normal cardiovascular exam Rhythm:Regular Rate:Normal     Neuro/Psych  Headaches  Anxiety      Neuromuscular disease    GI/Hepatic Neg liver ROS,GERD  Medicated,,  Endo/Other   Hyperthyroidism Obesity  Renal/GU Renal InsufficiencyRenal disease  negative genitourinary   Musculoskeletal  (+) Arthritis , Osteoarthritis,  Fibromyalgia -Psoriatic arthritis   Abdominal  (+) + obese  Peds  Hematology  (+) Blood dyscrasia, anemia Eliquis therapy- last dose 10/24    Anesthesia Other Findings Day of surgery medications reviewed with patient.  Reproductive/Obstetrics negative OB ROS                             Anesthesia Physical Anesthesia Plan  ASA: 3  Anesthesia Plan: Spinal   Post-op Pain Management: Minimal or no pain anticipated, Dilaudid IV, Precedex and Ofirmev IV (intra-op)*   Induction: Intravenous  PONV Risk Score and Plan: 3 and Treatment may vary due to age or medical condition, Propofol infusion and Ondansetron  Airway Management Planned: Natural Airway and Simple Face Mask  Additional Equipment: None  Intra-op Plan:   Post-operative Plan:   Informed Consent: I have  reviewed the patients History and Physical, chart, labs and discussed the procedure including the risks, benefits and alternatives for the proposed anesthesia with the patient or authorized representative who has indicated his/her understanding and acceptance.     Dental advisory given  Plan Discussed with: CRNA and Anesthesiologist  Anesthesia Plan Comments: (PAT note by Antionette Poles, PA-C: 57 year old female with pertinent history including chronic bronchitis/COPD (normal spirometry 04/04/23), moderate persistent asthma, CKD, GERD (uncontrolled per note 04/18/23, maintained on Nexium and Famotidine), psoriatic arthritis, OSA, HTN, migraines.  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  Pt reports LD Xarelto 07/21/23.  Patient had labs at PCP office on 06/27/2020 including CMP, CBC, TSH, A1c.  WBC 6.3, Hgb 11.9, Plt 298, creatinine 0.91, Na 139, K 3.8, TSH 1.42, A1c 6.3. Full results in care everywhere.   EKG 07/15/23: NSR. Rate 79.   )        Anesthesia Quick Evaluation

## 2023-07-16 NOTE — Progress Notes (Signed)
Anesthesia Chart Review:  57 year old female with pertinent history including chronic bronchitis/COPD (normal spirometry 04/04/23), moderate persistent asthma, CKD, GERD (uncontrolled per note 04/18/23, maintained on Nexium and Famotidine), psoriatic arthritis, OSA, HTN, migraines.  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  Pt reports LD Xarelto 07/21/23.  Patient had labs at PCP office on 06/27/2020 including CMP, CBC, TSH, A1c.  WBC 6.3, Hgb 11.9, Plt 298, creatinine 0.91, Na 139, K 3.8, TSH 1.42, A1c 6.3. Full results in care everywhere.   EKG 07/15/23: NSR. Rate 79.     Zannie Cove [facility] Short Stay Center/Anesthesiology Phone [phone] 07/16/2023 11:53 AM

## 2023-07-17 ENCOUNTER — Other Ambulatory Visit: Payer: Self-pay | Admitting: Physician Assistant

## 2023-07-17 MED ORDER — ONDANSETRON HCL 4 MG PO TABS: 4.0000 mg | ORAL_TABLET | Freq: Three times a day (TID) | ORAL | 0 refills | Status: DC | PRN

## 2023-07-17 MED ORDER — METHOCARBAMOL 750 MG PO TABS: 750.0000 mg | ORAL_TABLET | Freq: Two times a day (BID) | ORAL | 2 refills | Status: DC | PRN

## 2023-07-17 MED ORDER — APIXABAN 2.5 MG PO TABS: 2.5000 mg | ORAL_TABLET | Freq: Two times a day (BID) | ORAL | 0 refills | Status: AC

## 2023-07-17 MED ORDER — OXYCODONE-ACETAMINOPHEN 5-325 MG PO TABS: 1.0000 | ORAL_TABLET | Freq: Four times a day (QID) | ORAL | 0 refills | Status: DC | PRN

## 2023-07-22 ENCOUNTER — Ambulatory Visit
Admission: RE | Admit: 2023-07-22 | Discharge: 2023-07-22 | Disposition: A | Discharge status: Home / Self Care | Payer: 59 | Source: Ambulatory Visit | Attending: Family Medicine | Admitting: Family Medicine

## 2023-07-22 ENCOUNTER — Other Ambulatory Visit: Payer: Self-pay | Admitting: Physician Assistant

## 2023-07-22 DIAGNOSIS — Z9189 Other specified personal risk factors, not elsewhere classified: Secondary | ICD-10-CM

## 2023-07-24 ENCOUNTER — Encounter (HOSPITAL_COMMUNITY): Payer: Self-pay | Admitting: Orthopaedic Surgery

## 2023-07-25 ENCOUNTER — Ambulatory Visit (HOSPITAL_COMMUNITY): Payer: 59 | Admitting: Physician Assistant

## 2023-07-25 ENCOUNTER — Other Ambulatory Visit: Payer: Self-pay

## 2023-07-25 ENCOUNTER — Encounter (HOSPITAL_COMMUNITY): Payer: Self-pay | Admitting: Orthopaedic Surgery

## 2023-07-25 ENCOUNTER — Ambulatory Visit (HOSPITAL_COMMUNITY): Payer: 59

## 2023-07-25 ENCOUNTER — Observation Stay (HOSPITAL_COMMUNITY)
Admission: RE | Admit: 2023-07-25 | Discharge: 2023-07-26 | Disposition: A | Discharge status: Home / Self Care | Payer: 59 | Source: Ambulatory Visit | Attending: Orthopaedic Surgery | Admitting: Orthopaedic Surgery

## 2023-07-25 ENCOUNTER — Encounter (HOSPITAL_COMMUNITY)
Admission: RE | Disposition: A | Discharge status: Home / Self Care | Payer: Self-pay | Source: Ambulatory Visit | Attending: Orthopaedic Surgery

## 2023-07-25 ENCOUNTER — Observation Stay (HOSPITAL_COMMUNITY): Payer: 59

## 2023-07-25 DIAGNOSIS — Z79899 Other long term (current) drug therapy: Secondary | ICD-10-CM | POA: Diagnosis not present

## 2023-07-25 DIAGNOSIS — N189 Chronic kidney disease, unspecified: Secondary | ICD-10-CM | POA: Diagnosis not present

## 2023-07-25 DIAGNOSIS — I129 Hypertensive chronic kidney disease with stage 1 through stage 4 chronic kidney disease, or unspecified chronic kidney disease: Secondary | ICD-10-CM | POA: Insufficient documentation

## 2023-07-25 DIAGNOSIS — J449 Chronic obstructive pulmonary disease, unspecified: Secondary | ICD-10-CM | POA: Diagnosis not present

## 2023-07-25 DIAGNOSIS — M1612 Unilateral primary osteoarthritis, left hip: Secondary | ICD-10-CM

## 2023-07-25 DIAGNOSIS — Z96653 Presence of artificial knee joint, bilateral: Secondary | ICD-10-CM | POA: Diagnosis not present

## 2023-07-25 DIAGNOSIS — Z96641 Presence of right artificial hip joint: Secondary | ICD-10-CM | POA: Insufficient documentation

## 2023-07-25 DIAGNOSIS — Z96642 Presence of left artificial hip joint: Secondary | ICD-10-CM

## 2023-07-25 DIAGNOSIS — Z01818 Encounter for other preprocedural examination: Principal | ICD-10-CM

## 2023-07-25 HISTORY — PX: TOTAL HIP ARTHROPLASTY: SHX124

## 2023-07-25 SURGERY — ARTHROPLASTY, HIP, TOTAL, ANTERIOR APPROACH
Anesthesia: Spinal | Site: Hip | Laterality: Left

## 2023-07-25 MED ORDER — AMLODIPINE BESYLATE 5 MG PO TABS
2.5000 mg | ORAL_TABLET | Freq: Every day | ORAL | Status: DC
  Administered 2023-07-26: 2.5 mg via ORAL
  Filled 2023-07-25: qty 1

## 2023-07-25 MED ORDER — OXYCODONE HCL 5 MG PO TABS
10.0000 mg | ORAL_TABLET | ORAL | Status: DC | PRN
  Administered 2023-07-25 – 2023-07-26 (×4): 10 mg via ORAL
  Filled 2023-07-25 (×4): qty 2

## 2023-07-25 MED ORDER — OXYCODONE HCL 5 MG PO TABS: 5.0000 mg | ORAL_TABLET | Freq: Once | ORAL | Status: DC | PRN

## 2023-07-25 MED ORDER — DOXYCYCLINE HYCLATE 100 MG PO TABS: 100.0000 mg | ORAL_TABLET | Freq: Two times a day (BID) | ORAL | 0 refills | Status: DC

## 2023-07-25 MED ORDER — BUPIVACAINE IN DEXTROSE 0.75-8.25 % IT SOLN
INTRATHECAL | Status: DC | PRN
  Administered 2023-07-25: 1.7 mL via INTRATHECAL

## 2023-07-25 MED ORDER — SORBITOL 70 % SOLN
30.0000 mL | Freq: Every day | Status: DC | PRN
  Filled 2023-07-25: qty 30

## 2023-07-25 MED ORDER — ONDANSETRON HCL 4 MG PO TABS: 4.0000 mg | ORAL_TABLET | Freq: Four times a day (QID) | ORAL | Status: DC | PRN

## 2023-07-25 MED ORDER — PROPOFOL 10 MG/ML IV BOLUS
INTRAVENOUS | Status: AC
  Filled 2023-07-25: qty 20

## 2023-07-25 MED ORDER — OXYCODONE HCL 5 MG PO TABS: 5.0000 mg | ORAL_TABLET | ORAL | Status: DC | PRN

## 2023-07-25 MED ORDER — EPHEDRINE 5 MG/ML INJ
INTRAVENOUS | Status: AC
  Filled 2023-07-25: qty 5

## 2023-07-25 MED ORDER — LACTATED RINGERS IV SOLN: INTRAVENOUS | Status: DC | PRN

## 2023-07-25 MED ORDER — TRANEXAMIC ACID 1000 MG/10ML IV SOLN
2000.0000 mg | Freq: Once | INTRAVENOUS | Status: AC
  Administered 2023-07-25: 1000 mg via TOPICAL
  Filled 2023-07-25: qty 20

## 2023-07-25 MED ORDER — TRANEXAMIC ACID-NACL 1000-0.7 MG/100ML-% IV SOLN
1000.0000 mg | Freq: Once | INTRAVENOUS | Status: AC
  Administered 2023-07-25: 1000 mg via INTRAVENOUS
  Filled 2023-07-25: qty 100

## 2023-07-25 MED ORDER — BUPIVACAINE-MELOXICAM ER 400-12 MG/14ML IJ SOLN
INTRAMUSCULAR | Status: DC | PRN
  Administered 2023-07-25: 400 mg

## 2023-07-25 MED ORDER — VANCOMYCIN HCL 1 G IV SOLR
INTRAVENOUS | Status: DC | PRN
  Administered 2023-07-25: 1000 mg

## 2023-07-25 MED ORDER — FENTANYL CITRATE (PF) 250 MCG/5ML IJ SOLN
INTRAMUSCULAR | Status: AC
  Filled 2023-07-25: qty 5

## 2023-07-25 MED ORDER — PANTOPRAZOLE SODIUM 40 MG PO TBEC
40.0000 mg | DELAYED_RELEASE_TABLET | Freq: Every day | ORAL | Status: DC
  Administered 2023-07-25 – 2023-07-26 (×2): 40 mg via ORAL
  Filled 2023-07-25 (×2): qty 1

## 2023-07-25 MED ORDER — PHENOL 1.4 % MT LIQD: 1.0000 | OROMUCOSAL | Status: DC | PRN

## 2023-07-25 MED ORDER — MAGNESIUM CITRATE PO SOLN
1.0000 | Freq: Once | ORAL | Status: AC | PRN
  Administered 2023-07-25: 1 via ORAL
  Filled 2023-07-25: qty 296

## 2023-07-25 MED ORDER — DEXAMETHASONE SODIUM PHOSPHATE 10 MG/ML IJ SOLN
INTRAMUSCULAR | Status: AC
  Filled 2023-07-25: qty 1

## 2023-07-25 MED ORDER — ONDANSETRON HCL 4 MG/2ML IJ SOLN
INTRAMUSCULAR | Status: AC
  Filled 2023-07-25: qty 2

## 2023-07-25 MED ORDER — METOCLOPRAMIDE HCL 5 MG PO TABS: 5.0000 mg | ORAL_TABLET | Freq: Three times a day (TID) | ORAL | Status: DC | PRN

## 2023-07-25 MED ORDER — PHENYLEPHRINE 80 MCG/ML (10ML) SYRINGE FOR IV PUSH (FOR BLOOD PRESSURE SUPPORT)
PREFILLED_SYRINGE | INTRAVENOUS | Status: AC
  Filled 2023-07-25: qty 10

## 2023-07-25 MED ORDER — ALUM & MAG HYDROXIDE-SIMETH 200-200-20 MG/5ML PO SUSP: 30.0000 mL | ORAL | Status: DC | PRN

## 2023-07-25 MED ORDER — FERROUS SULFATE 325 (65 FE) MG PO TABS
325.0000 mg | ORAL_TABLET | Freq: Every day | ORAL | Status: DC
  Administered 2023-07-25 – 2023-07-26 (×2): 325 mg via ORAL
  Filled 2023-07-25 (×2): qty 1

## 2023-07-25 MED ORDER — DIPHENHYDRAMINE HCL 12.5 MG/5ML PO ELIX: 25.0000 mg | ORAL_SOLUTION | ORAL | Status: DC | PRN

## 2023-07-25 MED ORDER — CEFAZOLIN SODIUM-DEXTROSE 2-4 GM/100ML-% IV SOLN
INTRAVENOUS | Status: AC
  Filled 2023-07-25: qty 100

## 2023-07-25 MED ORDER — DEXAMETHASONE SODIUM PHOSPHATE 10 MG/ML IJ SOLN
INTRAMUSCULAR | Status: DC | PRN
  Administered 2023-07-25: 10 mg via INTRAVENOUS

## 2023-07-25 MED ORDER — POLYETHYLENE GLYCOL 3350 17 G PO PACK
17.0000 g | PACK | Freq: Every day | ORAL | Status: DC
  Filled 2023-07-25: qty 1

## 2023-07-25 MED ORDER — PROPOFOL 500 MG/50ML IV EMUL
INTRAVENOUS | Status: DC | PRN
  Administered 2023-07-25: 75 ug/kg/min via INTRAVENOUS

## 2023-07-25 MED ORDER — METHOCARBAMOL 1000 MG/10ML IJ SOLN
INTRAMUSCULAR | Status: AC
  Filled 2023-07-25: qty 10

## 2023-07-25 MED ORDER — DOCUSATE SODIUM 100 MG PO CAPS
100.0000 mg | ORAL_CAPSULE | Freq: Two times a day (BID) | ORAL | Status: DC
  Administered 2023-07-25 (×2): 100 mg via ORAL
  Filled 2023-07-25 (×3): qty 1

## 2023-07-25 MED ORDER — CHLORHEXIDINE GLUCONATE 0.12 % MT SOLN
15.0000 mL | Freq: Once | OROMUCOSAL | Status: AC
  Administered 2023-07-25: 15 mL via OROMUCOSAL
  Filled 2023-07-25: qty 15

## 2023-07-25 MED ORDER — VENLAFAXINE HCL ER 75 MG PO CP24
75.0000 mg | ORAL_CAPSULE | Freq: Every day | ORAL | Status: DC
  Administered 2023-07-25 – 2023-07-26 (×2): 75 mg via ORAL
  Filled 2023-07-25 (×2): qty 1

## 2023-07-25 MED ORDER — MIDAZOLAM HCL 2 MG/2ML IJ SOLN
INTRAMUSCULAR | Status: AC
Start: 2023-07-25 — End: ?
  Filled 2023-07-25: qty 2

## 2023-07-25 MED ORDER — CEFAZOLIN SODIUM-DEXTROSE 2-3 GM-%(50ML) IV SOLR
INTRAVENOUS | Status: DC | PRN
  Administered 2023-07-25: 2 g via INTRAVENOUS

## 2023-07-25 MED ORDER — APIXABAN 2.5 MG PO TABS
2.5000 mg | ORAL_TABLET | Freq: Two times a day (BID) | ORAL | Status: DC
  Administered 2023-07-26: 2.5 mg via ORAL
  Filled 2023-07-25 (×2): qty 1

## 2023-07-25 MED ORDER — ACETAMINOPHEN 325 MG PO TABS: 325.0000 mg | ORAL_TABLET | Freq: Four times a day (QID) | ORAL | Status: DC | PRN

## 2023-07-25 MED ORDER — DOXYCYCLINE HYCLATE 100 MG PO TABS
100.0000 mg | ORAL_TABLET | Freq: Two times a day (BID) | ORAL | Status: DC
  Administered 2023-07-26: 100 mg via ORAL
  Filled 2023-07-25: qty 1

## 2023-07-25 MED ORDER — ONDANSETRON HCL 4 MG/2ML IJ SOLN
INTRAMUSCULAR | Status: DC | PRN
  Administered 2023-07-25: 4 mg via INTRAVENOUS

## 2023-07-25 MED ORDER — METHOCARBAMOL 1000 MG/10ML IJ SOLN
750.0000 mg | Freq: Once | INTRAMUSCULAR | Status: AC
  Administered 2023-07-25: 750 mg via INTRAVENOUS

## 2023-07-25 MED ORDER — TRANEXAMIC ACID 1000 MG/10ML IV SOLN
INTRAVENOUS | Status: DC | PRN
  Administered 2023-07-25: 2000 mg via TOPICAL

## 2023-07-25 MED ORDER — MORPHINE SULFATE (PF) 2 MG/ML IV SOLN
INTRAVENOUS | Status: AC
  Filled 2023-07-25: qty 1

## 2023-07-25 MED ORDER — SODIUM CHLORIDE 0.9 % IR SOLN
Status: DC | PRN
  Administered 2023-07-25: 1000 mL

## 2023-07-25 MED ORDER — METHOCARBAMOL 1000 MG/10ML IJ SOLN: 500.0000 mg | Freq: Four times a day (QID) | INTRAMUSCULAR | Status: DC | PRN

## 2023-07-25 MED ORDER — DEXAMETHASONE SODIUM PHOSPHATE 10 MG/ML IJ SOLN
10.0000 mg | Freq: Once | INTRAMUSCULAR | Status: AC
  Administered 2023-07-26: 10 mg via INTRAVENOUS
  Filled 2023-07-25: qty 1

## 2023-07-25 MED ORDER — LIDOCAINE 2% (20 MG/ML) 5 ML SYRINGE
INTRAMUSCULAR | Status: DC | PRN
  Administered 2023-07-25: 60 mg via INTRAVENOUS

## 2023-07-25 MED ORDER — PROPOFOL 10 MG/ML IV BOLUS
INTRAVENOUS | Status: DC | PRN
  Administered 2023-07-25 (×3): 20 mg via INTRAVENOUS

## 2023-07-25 MED ORDER — VANCOMYCIN HCL 1000 MG IV SOLR
INTRAVENOUS | Status: AC
  Filled 2023-07-25: qty 20

## 2023-07-25 MED ORDER — TRANEXAMIC ACID-NACL 1000-0.7 MG/100ML-% IV SOLN
INTRAVENOUS | Status: AC
  Filled 2023-07-25: qty 100

## 2023-07-25 MED ORDER — MIDAZOLAM HCL 2 MG/2ML IJ SOLN
INTRAMUSCULAR | Status: AC
  Filled 2023-07-25: qty 2

## 2023-07-25 MED ORDER — PHENYLEPHRINE 80 MCG/ML (10ML) SYRINGE FOR IV PUSH (FOR BLOOD PRESSURE SUPPORT)
PREFILLED_SYRINGE | INTRAVENOUS | Status: DC | PRN
  Administered 2023-07-25 (×4): 120 ug via INTRAVENOUS
  Administered 2023-07-25: 160 ug via INTRAVENOUS
  Administered 2023-07-25 (×6): 80 ug via INTRAVENOUS
  Administered 2023-07-25: 120 ug via INTRAVENOUS

## 2023-07-25 MED ORDER — 0.9 % SODIUM CHLORIDE (POUR BTL) OPTIME
TOPICAL | Status: DC | PRN
  Administered 2023-07-25: 1000 mL

## 2023-07-25 MED ORDER — ORAL CARE MOUTH RINSE: 15.0000 mL | Freq: Once | OROMUCOSAL | Status: AC

## 2023-07-25 MED ORDER — STERILE WATER FOR IRRIGATION IR SOLN
Status: DC | PRN
  Administered 2023-07-25: 1000 mL

## 2023-07-25 MED ORDER — ONDANSETRON HCL 4 MG/2ML IJ SOLN: 4.0000 mg | Freq: Once | INTRAMUSCULAR | Status: DC | PRN

## 2023-07-25 MED ORDER — ACETAMINOPHEN 500 MG PO TABS
1000.0000 mg | ORAL_TABLET | Freq: Four times a day (QID) | ORAL | Status: AC
  Administered 2023-07-25 – 2023-07-26 (×4): 1000 mg via ORAL
  Filled 2023-07-25 (×4): qty 2

## 2023-07-25 MED ORDER — DROPERIDOL 2.5 MG/ML IJ SOLN: 0.6250 mg | Freq: Once | INTRAMUSCULAR | Status: DC | PRN

## 2023-07-25 MED ORDER — PRONTOSAN WOUND IRRIGATION OPTIME
TOPICAL | Status: DC | PRN
  Administered 2023-07-25: 450 mL via TOPICAL

## 2023-07-25 MED ORDER — PROPOFOL 1000 MG/100ML IV EMUL
INTRAVENOUS | Status: AC
  Filled 2023-07-25: qty 100

## 2023-07-25 MED ORDER — CEFAZOLIN SODIUM-DEXTROSE 2-4 GM/100ML-% IV SOLN
2.0000 g | Freq: Four times a day (QID) | INTRAVENOUS | Status: AC
Start: 2023-07-25 — End: 2023-07-26
  Administered 2023-07-25 (×2): 2 g via INTRAVENOUS
  Filled 2023-07-25 (×2): qty 100

## 2023-07-25 MED ORDER — MIDAZOLAM HCL 2 MG/2ML IJ SOLN
INTRAMUSCULAR | Status: DC | PRN
  Administered 2023-07-25: 2 mg via INTRAVENOUS
  Administered 2023-07-25: .5 mg via INTRAVENOUS
  Administered 2023-07-25: 1 mg via INTRAVENOUS
  Administered 2023-07-25: .5 mg via INTRAVENOUS

## 2023-07-25 MED ORDER — METHOCARBAMOL 500 MG PO TABS
500.0000 mg | ORAL_TABLET | Freq: Four times a day (QID) | ORAL | Status: DC | PRN
  Administered 2023-07-25 – 2023-07-26 (×2): 500 mg via ORAL
  Filled 2023-07-25 (×2): qty 1

## 2023-07-25 MED ORDER — HYDROXYZINE HCL 50 MG/ML IM SOLN
50.0000 mg | Freq: Four times a day (QID) | INTRAMUSCULAR | Status: DC | PRN
  Administered 2023-07-25: 50 mg via INTRAMUSCULAR
  Filled 2023-07-25: qty 1

## 2023-07-25 MED ORDER — ONDANSETRON HCL 4 MG/2ML IJ SOLN
4.0000 mg | Freq: Four times a day (QID) | INTRAMUSCULAR | Status: DC | PRN
  Administered 2023-07-25: 4 mg via INTRAVENOUS
  Filled 2023-07-25: qty 2

## 2023-07-25 MED ORDER — MORPHINE SULFATE (PF) 2 MG/ML IV SOLN
1.0000 mg | INTRAVENOUS | Status: DC | PRN
  Administered 2023-07-25: 1 mg via INTRAVENOUS

## 2023-07-25 MED ORDER — HYDROMORPHONE HCL 1 MG/ML IJ SOLN
0.5000 mg | INTRAMUSCULAR | Status: DC | PRN
  Administered 2023-07-25: 0.5 mg via INTRAVENOUS
  Filled 2023-07-25: qty 0.5

## 2023-07-25 MED ORDER — BUPIVACAINE-MELOXICAM ER 400-12 MG/14ML IJ SOLN
INTRAMUSCULAR | Status: AC
  Filled 2023-07-25: qty 1

## 2023-07-25 MED ORDER — METOCLOPRAMIDE HCL 5 MG/ML IJ SOLN
5.0000 mg | Freq: Three times a day (TID) | INTRAMUSCULAR | Status: DC | PRN
Start: 2023-07-25 — End: 2023-07-26

## 2023-07-25 MED ORDER — OXYCODONE HCL 5 MG/5ML PO SOLN: 5.0000 mg | Freq: Once | ORAL | Status: DC | PRN

## 2023-07-25 MED ORDER — PREGABALIN 75 MG PO CAPS
150.0000 mg | ORAL_CAPSULE | Freq: Every day | ORAL | Status: DC
  Administered 2023-07-25: 150 mg via ORAL
  Filled 2023-07-25: qty 2

## 2023-07-25 SURGICAL SUPPLY — 73 items
ADH SKN CLS APL DERMABOND .7 (GAUZE/BANDAGES/DRESSINGS) ×1
AGENT HMST PWDR BTL CLGN 5GM (Miscellaneous) ×1 IMPLANT
BAG COUNTER SPONGE SURGICOUNT (BAG) ×2 IMPLANT
BAG DECANTER FOR FLEXI CONT (MISCELLANEOUS) ×2 IMPLANT
BAG SPNG CNTER NS LX DISP (BAG) ×1
BLADE SAG 18X100X1.27 (BLADE) ×2 IMPLANT
CELLS DAT CNTRL 66122 CELL SVR (MISCELLANEOUS) ×1 IMPLANT
COLLAGEN CELLERATERX 5 GRAM (Miscellaneous) IMPLANT
COOLER ICEMAN CLASSIC (MISCELLANEOUS) IMPLANT
COVER PERINEAL POST (MISCELLANEOUS) ×2 IMPLANT
COVER SURGICAL LIGHT HANDLE (MISCELLANEOUS) ×2 IMPLANT
CUP SECTOR GRIPTON 50MM (Cup) IMPLANT
DERMABOND ADVANCED .7 DNX12 (GAUZE/BANDAGES/DRESSINGS) IMPLANT
DRAPE C-ARM 42X72 X-RAY (DRAPES) ×2 IMPLANT
DRAPE POUCH INSTRU U-SHP 10X18 (DRAPES) ×2 IMPLANT
DRAPE STERI IOBAN 125X83 (DRAPES) ×2 IMPLANT
DRAPE U-SHAPE 47X51 STRL (DRAPES) ×4 IMPLANT
DRSG AQUACEL AG ADV 3.5X10 (GAUZE/BANDAGES/DRESSINGS) ×2 IMPLANT
DURAPREP 26ML APPLICATOR (WOUND CARE) ×4 IMPLANT
ELECT BLADE 4.0 EZ CLEAN MEGAD (MISCELLANEOUS) ×1
ELECT REM PT RETURN 9FT ADLT (ELECTROSURGICAL) ×1
ELECTRODE BLDE 4.0 EZ CLN MEGD (MISCELLANEOUS) ×2 IMPLANT
ELECTRODE REM PT RTRN 9FT ADLT (ELECTROSURGICAL) ×2 IMPLANT
GLOVE BIOGEL PI IND STRL 7.0 (GLOVE) ×4 IMPLANT
GLOVE BIOGEL PI IND STRL 7.5 (GLOVE) ×10 IMPLANT
GLOVE ECLIPSE 7.0 STRL STRAW (GLOVE) ×4 IMPLANT
GLOVE SKINSENSE STRL SZ7.5 (GLOVE) ×2 IMPLANT
GLOVE SURG SYN 7.5 E (GLOVE) ×2 IMPLANT
GLOVE SURG SYN 7.5 PF PI (GLOVE) ×4 IMPLANT
GLOVE SURG UNDER POLY LF SZ7 (GLOVE) ×6 IMPLANT
GLOVE SURG UNDER POLY LF SZ7.5 (GLOVE) ×4 IMPLANT
GOWN STRL REUS W/ TWL LRG LVL3 (GOWN DISPOSABLE) IMPLANT
GOWN STRL REUS W/ TWL XL LVL3 (GOWN DISPOSABLE) ×2 IMPLANT
GOWN STRL REUS W/TWL LRG LVL3 (GOWN DISPOSABLE)
GOWN STRL REUS W/TWL XL LVL3 (GOWN DISPOSABLE) ×1
GOWN STRL SURGICAL XL XLNG (GOWN DISPOSABLE) ×2 IMPLANT
GOWN TOGA ZIPPER T7+ PEEL AWAY (MISCELLANEOUS) ×4 IMPLANT
HANDPIECE INTERPULSE COAX TIP (DISPOSABLE) ×1
HEAD FEMORAL 32 CERAMIC (Hips) IMPLANT
HOOD PEEL AWAY T7 (MISCELLANEOUS) ×2 IMPLANT
IV NS IRRIG 3000ML ARTHROMATIC (IV SOLUTION) ×2 IMPLANT
KIT BASIN OR (CUSTOM PROCEDURE TRAY) ×2 IMPLANT
LINER ACET PNNCL PLUS4 NEUTRAL (Hips) IMPLANT
MARKER SKIN DUAL TIP RULER LAB (MISCELLANEOUS) ×2 IMPLANT
NDL SPNL 18GX3.5 QUINCKE PK (NEEDLE) ×2 IMPLANT
NEEDLE SPNL 18GX3.5 QUINCKE PK (NEEDLE) ×1 IMPLANT
PACK TOTAL JOINT (CUSTOM PROCEDURE TRAY) ×2 IMPLANT
PACK UNIVERSAL I (CUSTOM PROCEDURE TRAY) ×2 IMPLANT
PAD COLD SHLDR WRAP-ON (PAD) IMPLANT
PINNACLE PLUS 4 NEUTRAL (Hips) ×1 IMPLANT
RETRACTOR WND ALEXIS 18 MED (MISCELLANEOUS) IMPLANT
RTRCTR WOUND ALEXIS 18CM MED (MISCELLANEOUS) ×1
SCREW PINN CAN 6.5X20 (Screw) IMPLANT
SET HNDPC FAN SPRY TIP SCT (DISPOSABLE) ×2 IMPLANT
SOLUTION PRONTOSAN WOUND 350ML (IRRIGATION / IRRIGATOR) ×2 IMPLANT
STAPLER VISISTAT 35W (STAPLE) IMPLANT
STEM FEM ACTIS HIGH SZ3 (Stem) IMPLANT
SUT ETHIBOND 2 V 37 (SUTURE) ×2 IMPLANT
SUT ETHILON 2 0 FS 18 (SUTURE) IMPLANT
SUT VIC AB 0 CT1 27 (SUTURE) ×1
SUT VIC AB 0 CT1 27XBRD ANBCTR (SUTURE) ×2 IMPLANT
SUT VIC AB 1 CTX 27 (SUTURE) IMPLANT
SUT VIC AB 1 CTX 36 (SUTURE) ×1
SUT VIC AB 1 CTX36XBRD ANBCTR (SUTURE) ×2 IMPLANT
SUT VIC AB 2-0 CT1 27 (SUTURE) ×2
SUT VIC AB 2-0 CT1 TAPERPNT 27 (SUTURE) ×4 IMPLANT
SYR 50ML LL SCALE MARK (SYRINGE) ×2 IMPLANT
TOWEL GREEN STERILE (TOWEL DISPOSABLE) ×2 IMPLANT
TRAY CATH INTERMITTENT SS 16FR (CATHETERS) IMPLANT
TRAY FOLEY W/BAG SLVR 16FR (SET/KITS/TRAYS/PACK)
TRAY FOLEY W/BAG SLVR 16FR ST (SET/KITS/TRAYS/PACK) IMPLANT
TUBE SUCT ARGYLE STRL (TUBING) ×2 IMPLANT
YANKAUER SUCT BULB TIP NO VENT (SUCTIONS) ×2 IMPLANT

## 2023-07-25 NOTE — Plan of Care (Signed)

## 2023-07-25 NOTE — Transfer of Care (Signed)
Immediate Anesthesia Transfer of Care Note  Patient: Christine Bradley  Procedure(s) Performed: TOTAL HIP ARTHROPLASTY ANTERIOR APPROACH (Left: Hip)  Patient Location: PACU  Anesthesia Type:MAC and Spinal  Level of Consciousness: drowsy  Airway & Oxygen Therapy: Patient Spontanous Breathing  Post-op Assessment: Report given to RN and Post -op Vital signs reviewed and stable  Post vital signs: Reviewed and stable  Last Vitals:  Vitals Value Taken Time  BP 136/88 07/25/23 0934  Temp    Pulse 87 07/25/23 0938  Resp 17 07/25/23 0938  SpO2 100 % 07/25/23 0938  Vitals shown include unfiled device data.  Last Pain:  Vitals:   07/25/23 0556  TempSrc:   PainSc: 10-Worst pain ever      Patients Stated Pain Goal: 0 (07/15/23 0806)  Complications: No notable events documented.

## 2023-07-25 NOTE — Discharge Instructions (Addendum)
INSTRUCTIONS AFTER JOINT REPLACEMENT   Remove items at home which could result in a fall. This includes throw rugs or furniture in walking pathways ICE to the affected joint every three hours while awake for 30 minutes at a time, for at least the first 3-5 days, and then as needed for pain and swelling.  Continue to use ice for pain and swelling. You may notice swelling that will progress down to the foot and ankle.  This is normal after surgery.  Elevate your leg when you are not up walking on it.   Continue to use the breathing machine you got in the hospital (incentive spirometer) which will help keep your temperature down.  It is common for your temperature to cycle up and down following surgery, especially at night when you are not up moving around and exerting yourself.  The breathing machine keeps your lungs expanded and your temperature down.   DIET:  As you were doing prior to hospitalization, we recommend a well-balanced diet.  DRESSING / WOUND CARE / SHOWERING  Keep the surgical dressing until follow up.  The dressing is water proof, so you can shower without any extra covering.  IF THE DRESSING FALLS OFF or the wound gets wet inside, change the dressing with sterile gauze.  Please use good hand washing techniques before changing the dressing.  Do not use any lotions or creams on the incision until instructed by your surgeon.    ACTIVITY  Increase activity slowly as tolerated, but follow the weight bearing instructions below.   No driving for 6 weeks or until further direction given by your physician.  You cannot drive while taking narcotics.  No lifting or carrying greater than 10 lbs. until further directed by your surgeon. Avoid periods of inactivity such as sitting longer than an hour when not asleep. This helps prevent blood clots.  You may return to work once you are authorized by your doctor.     WEIGHT BEARING   Weight bearing as tolerated with assist device (walker, cane,  etc) as directed, use it as long as suggested by your surgeon or therapist, typically at least 4-6 weeks.   EXERCISES  Results after joint replacement surgery are often greatly improved when you follow the exercise, range of motion and muscle strengthening exercises prescribed by your doctor. Safety measures are also important to protect the joint from further injury. Any time any of these exercises cause you to have increased pain or swelling, decrease what you are doing until you are comfortable again and then slowly increase them. If you have problems or questions, call your caregiver or physical therapist for advice.   Rehabilitation is important following a joint replacement. After just a few days of immobilization, the muscles of the leg can become weakened and shrink (atrophy).  These exercises are designed to build up the tone and strength of the thigh and leg muscles and to improve motion. Often times heat used for twenty to thirty minutes before working out will loosen up your tissues and help with improving the range of motion but do not use heat for the first two weeks following surgery (sometimes heat can increase post-operative swelling).   These exercises can be done on a training (exercise) mat, on the floor, on a table or on a bed. Use whatever works the best and is most comfortable for you.    Use music or television while you are exercising so that the exercises are a pleasant break in your   day. This will make your life better with the exercises acting as a break in your routine that you can look forward to.   Perform all exercises about fifteen times, three times per day or as directed.  You should exercise both the operative leg and the other leg as well.  Exercises include:   Quad Sets - Tighten up the muscle on the front of the thigh (Quad) and hold for 5-10 seconds.   Straight Leg Raises - With your knee straight (if you were given a brace, keep it on), lift the leg to 60  degrees, hold for 3 seconds, and slowly lower the leg.  Perform this exercise against resistance later as your leg gets stronger.  Leg Slides: Lying on your back, slowly slide your foot toward your buttocks, bending your knee up off the floor (only go as far as is comfortable). Then slowly slide your foot back down until your leg is flat on the floor again.  Angel Wings: Lying on your back spread your legs to the side as far apart as you can without causing discomfort.  Hamstring Strength:  Lying on your back, push your heel against the floor with your leg straight by tightening up the muscles of your buttocks.  Repeat, but this time bend your knee to a comfortable angle, and push your heel against the floor.  You may put a pillow under the heel to make it more comfortable if necessary.   A rehabilitation program following joint replacement surgery can speed recovery and prevent re-injury in the future due to weakened muscles. Contact your doctor or a physical therapist for more information on knee rehabilitation.    CONSTIPATION  Constipation is defined medically as fewer than three stools per week and severe constipation as less than one stool per week.  Even if you have a regular bowel pattern at home, your normal regimen is likely to be disrupted due to multiple reasons following surgery.  Combination of anesthesia, postoperative narcotics, change in appetite and fluid intake all can affect your bowels.   YOU MUST use at least one of the following options; they are listed in order of increasing strength to get the job done.  They are all available over the counter, and you may need to use some, POSSIBLY even all of these options:    Drink plenty of fluids (prune juice may be helpful) and high fiber foods Colace 100 mg by mouth twice a day  Senokot for constipation as directed and as needed Dulcolax (bisacodyl), take with full glass of water  Miralax (polyethylene glycol) once or twice a day as  needed.  If you have tried all these things and are unable to have a bowel movement in the first 3-4 days after surgery call either your surgeon or your primary doctor.    If you experience loose stools or diarrhea, hold the medications until you stool forms back up.  If your symptoms do not get better within 1 week or if they get worse, check with your doctor.  If you experience "the worst abdominal pain ever" or develop nausea or vomiting, please contact the office immediately for further recommendations for treatment.   ITCHING:  If you experience itching with your medications, try taking only a single pain pill, or even half a pain pill at a time.  You can also use Benadryl over the counter for itching or also to help with sleep.   TED HOSE STOCKINGS:  Use stockings on both   legs until for at least 2 weeks or as directed by physician office. They may be removed at night for sleeping.  MEDICATIONS:  See your medication summary on the "After Visit Summary" that nursing will review with you.  You may have some home medications which will be placed on hold until you complete the course of blood thinner medication.  It is important for you to complete the blood thinner medication as prescribed.  PRECAUTIONS:  If you experience chest pain or shortness of breath - call 911 immediately for transfer to the hospital emergency department.   If you develop a fever greater that 101 F, purulent drainage from wound, increased redness or drainage from wound, foul odor from the wound/dressing, or calf pain - CONTACT YOUR SURGEON.                                                   FOLLOW-UP APPOINTMENTS:  If you do not already have a post-op appointment, please call the office for an appointment to be seen by your surgeon.  Guidelines for how soon to be seen are listed in your "After Visit Summary", but are typically between 1-4 weeks after surgery.  OTHER INSTRUCTIONS:   Knee Replacement:  Do not place pillow  under knee, focus on keeping the knee straight while resting. CPM instructions: 0-90 degrees, 2 hours in the morning, 2 hours in the afternoon, and 2 hours in the evening. Place foam block, curve side up under heel at all times except when in CPM or when walking.  DO NOT modify, tear, cut, or change the foam block in any way.  POST-OPERATIVE OPIOID TAPER INSTRUCTIONS: It is important to wean off of your opioid medication as soon as possible. If you do not need pain medication after your surgery it is ok to stop day one. Opioids include: Codeine, Hydrocodone(Norco, Vicodin), Oxycodone(Percocet, oxycontin) and hydromorphone amongst others.  Long term and even short term use of opiods can cause: Increased pain response Dependence Constipation Depression Respiratory depression And more.  Withdrawal symptoms can include Flu like symptoms Nausea, vomiting And more Techniques to manage these symptoms Hydrate well Eat regular healthy meals Stay active Use relaxation techniques(deep breathing, meditating, yoga) Do Not substitute Alcohol to help with tapering If you have been on opioids for less than two weeks and do not have pain than it is ok to stop all together.  Plan to wean off of opioids This plan should start within one week post op of your joint replacement. Maintain the same interval or time between taking each dose and first decrease the dose.  Cut the total daily intake of opioids by one tablet each day Next start to increase the time between doses. The last dose that should be eliminated is the evening dose.   MAKE SURE YOU:  Understand these instructions.  Get help right away if you are not doing well or get worse.    Thank you for letting us be a part of your medical care team.  It is a privilege we respect greatly.  We hope these instructions will help you stay on track for a fast and full recovery!     Information on my medicine - ELIQUIS (apixaban)  This medication  education was reviewed with me or my healthcare representative as part of my discharge preparation.  Why was  Eliquis prescribed for you? Eliquis was prescribed for you to reduce the risk of blood clots forming after orthopedic surgery.    What do You need to know about Eliquis? Take your Eliquis TWICE DAILY - one tablet in the morning and one tablet in the evening with or without food.  It would be best to take the dose about the same time each day.  If you have difficulty swallowing the tablet whole please discuss with your pharmacist how to take the medication safely.  Take Eliquis exactly as prescribed by your doctor and DO NOT stop taking Eliquis without talking to the doctor who prescribed the medication.  Stopping without other medication to take the place of Eliquis may increase your risk of developing a clot.  After discharge, you should have regular check-up appointments with your healthcare provider that is prescribing your Eliquis.  What do you do if you miss a dose? If a dose of ELIQUIS is not taken at the scheduled time, take it as soon as possible on the same day and twice-daily administration should be resumed.  The dose should not be doubled to make up for a missed dose.  Do not take more than one tablet of ELIQUIS at the same time.  Important Safety Information A possible side effect of Eliquis is bleeding. You should call your healthcare provider right away if you experience any of the following: Bleeding from an injury or your nose that does not stop. Unusual colored urine (red or dark brown) or unusual colored stools (red or black). Unusual bruising for unknown reasons. A serious fall or if you hit your head (even if there is no bleeding).  Some medicines may interact with Eliquis and might increase your risk of bleeding or clotting while on Eliquis. To help avoid this, consult your healthcare provider or pharmacist prior to using any new prescription or  non-prescription medications, including herbals, vitamins, non-steroidal anti-inflammatory drugs (NSAIDs) and supplements.  This website has more information on Eliquis (apixaban): http://www.eliquis.com/eliquis/home

## 2023-07-25 NOTE — Evaluation (Signed)
Physical Therapy Evaluation Patient Details Name: Christine Bradley MRN: [id number] DOB: [birth year] Today's Date: 07/25/2023  History of Present Illness  57 yo female s/p L DA-THA on 10/31. PMH includes R THA, bilat TKR 2023, fibromyalgia, HTN, IBS.  Clinical Impression  Pt presents with L hip pain, impaired gait, impaired LLE strength, and decreased activity tolerance. Pt to benefit from acute PT to address deficits. Pt ambulated short hallway distance, limited by min dizziness and nausea which passed with rest. Of note, pt noting her L hip "popped" upon initial standing but pt with no difficulty bearing weight and no more episodes for rest of session. Pt educated on ankle pumps (20/hour) to perform this afternoon/evening to increase circulation, to pt's tolerance and limited by pain. PT to progress mobility as tolerated, and will continue to follow acutely.            If plan is discharge home, recommend the following: A little help with walking and/or transfers;A little help with bathing/dressing/bathroom   Can travel by private vehicle        Equipment Recommendations None recommended by PT  Recommendations for Other Services       Functional Status Assessment Patient has had a recent decline in their functional status and demonstrates the ability to make significant improvements in function in a reasonable and predictable amount of time.     Precautions / Restrictions Precautions Precautions: Fall Restrictions Weight Bearing Restrictions: No      Mobility  Bed Mobility Overal bed mobility: Needs Assistance Bed Mobility: Supine to Sit, Sit to Supine     Supine to sit: Min assist, HOB elevated Sit to supine: Min assist, HOB elevated   General bed mobility comments: light assist for LLE lift    Transfers Overall transfer level: Needs assistance Equipment used: Rolling walker (2 wheels) Transfers: Sit to/from Stand Sit to Stand: Min assist           General transfer  comment: for rise and steady, cues for hand placement    Ambulation/Gait Ambulation/Gait assistance: Contact guard assist Gait Distance (Feet): 40 Feet Assistive device: Rolling walker (2 wheels) Gait Pattern/deviations: Step-through pattern, Decreased stride length, Trunk flexed Gait velocity: decr     General Gait Details: cues for upright posture, sequencing, placeement in RW.  Stairs            Wheelchair Mobility     Tilt Bed    Modified Rankin (Stroke Patients Only)       Balance Overall balance assessment: Needs assistance Sitting-balance support: Feet supported, No upper extremity supported Sitting balance-Leahy Scale: Fair     Standing balance support: Bilateral upper extremity supported, During functional activity, Reliant on assistive device for balance Standing balance-Leahy Scale: Poor                               Pertinent Vitals/Pain Pain Assessment Pain Assessment: Faces Faces Pain Scale: Hurts little more Pain Location: L hip Pain Descriptors / Indicators: Discomfort, Grimacing, Guarding Pain Intervention(s): Limited activity within patient's tolerance, Monitored during session, Repositioned    Home Living Family/patient expects to be discharged to:: Private residence Living Arrangements: Parent Available Help at Discharge: Family;Available 24 hours/day Type of Home: Apartment Home Access: Level entry       Home Layout: One level Home Equipment: Agricultural consultant (2 wheels);Cane - single point;BSC/3in1      Prior Function Prior Level of Function : Independent/Modified Independent  Mobility Comments: using cane for gait PTA       Extremity/Trunk Assessment   Upper Extremity Assessment Upper Extremity Assessment: Defer to OT evaluation    Lower Extremity Assessment Lower Extremity Assessment: LLE deficits/detail LLE Deficits / Details: post-operative weakness; able to perform ankle pump, quad set,  heel slide    Cervical / Trunk Assessment Cervical / Trunk Assessment: Normal  Communication   Communication Communication: No apparent difficulties Cueing Techniques: Verbal cues;Gestural cues  Cognition Arousal: Alert Behavior During Therapy: WFL for tasks assessed/performed Overall Cognitive Status: Within Functional Limits for tasks assessed                                          General Comments      Exercises     Assessment/Plan    PT Assessment Patient needs continued PT services  PT Problem List Decreased strength;Decreased mobility;Decreased activity tolerance;Decreased range of motion;Decreased balance;Pain       PT Treatment Interventions DME instruction;Therapeutic activities;Gait training;Therapeutic exercise;Patient/family education;Balance training;Stair training;Functional mobility training    PT Goals (Current goals can be found in the Care Plan section)  Acute Rehab PT Goals PT Goal Formulation: With patient Time For Goal Achievement: 08/01/23 Potential to Achieve Goals: Good    Frequency 7X/week     Co-evaluation               AM-PAC PT "6 Clicks" Mobility  Outcome Measure Help needed turning from your back to your side while in a flat bed without using bedrails?: A Little Help needed moving from lying on your back to sitting on the side of a flat bed without using bedrails?: A Little Help needed moving to and from a bed to a chair (including a wheelchair)?: A Little Help needed standing up from a chair using your arms (e.g., wheelchair or bedside chair)?: A Little Help needed to walk in hospital room?: A Little Help needed climbing 3-5 steps with a railing? : A Little 6 Click Score: 18    End of Session   Activity Tolerance: Patient tolerated treatment well Patient left: in bed;with call bell/phone within reach Nurse Communication: Mobility status PT Visit Diagnosis: Other abnormalities of gait and mobility  (R26.89);Muscle weakness (generalized) (M62.81)    Time: 4010-2725 PT Time Calculation (min) (ACUTE ONLY): 17 min   Charges:   PT Evaluation $PT Eval Low Complexity: 1 Low   PT General Charges $$ ACUTE PT VISIT: 1 Visit         Marye Round, PT DPT Acute Rehabilitation Services Secure Chat Preferred  Office [phone]   Harika Laidlaw Sheliah Plane 07/25/2023, 4:27 PM

## 2023-07-25 NOTE — H&P (Signed)
PREOPERATIVE H&P  Chief Complaint: left hip osteoathritis  HPI: Christine Bradley is a 57 y.o. female who presents for surgical treatment of left hip osteoathritis.  She denies any changes in medical history.  Past Surgical History:  Procedure Laterality Date   ABDOMINAL HYSTERECTOMY  2004   BREAST EXCISIONAL BIOPSY Left 12/31/2017   foot surgery  1997   HERNIA REPAIR  1996   RADIOACTIVE SEED GUIDED EXCISIONAL BREAST BIOPSY Left 12/31/2017   Procedure: LEFT BREAST SEED GUIDED EXCISIONAL BIOPSY;  Surgeon: Abigail Miyamoto, MD;  Location: [facility] OR;  Service: General;  Laterality: Left;   right shoulder repair Right    TOTAL HIP ARTHROPLASTY Right 08/29/2021   Procedure: TOTAL HIP ARTHROPLASTY ANTERIOR APPROACH;  Surgeon: Durene Romans, MD;  Location: [facility];  Service: Orthopedics;  Laterality: Right;   TOTAL KNEE ARTHROPLASTY Right 11/07/2021   Procedure: TOTAL KNEE ARTHROPLASTY;  Surgeon: Durene Romans, MD;  Location: [facility];  Service: Orthopedics;  Laterality: Right;   TOTAL KNEE ARTHROPLASTY Left 07/10/2022   Procedure: TOTAL KNEE ARTHROPLASTY;  Surgeon: Durene Romans, MD;  Location: [facility];  Service: Orthopedics;  Laterality: Left;   WISDOM TOOTH EXTRACTION     Social History   Socioeconomic History   Marital status: Single    Spouse name: Not on file   Number of children: 3   Years of education: Not on file   Highest education level: Not on file  Occupational History   Occupation: disability  Tobacco Use   Smoking status: Never   Smokeless tobacco: Never  Vaping Use   Vaping status: Never Used  Substance and Sexual Activity   Alcohol use: Yes    Alcohol/week: 1.0 standard drink of alcohol    Types: 1 Glasses of wine per week   Drug use: Yes    Types: Marijuana    Comment: 1-2 x per month for help with pain, last 07/07/22   Sexual activity: Not on file  Other Topics Concern   Not on file  Social History Narrative   Not on file   Social Determinants of Health    Financial Resource Strain: High Risk (06/25/2023)   Received from [facility]   Overall Financial Resource Strain (CARDIA)    Difficulty of Paying Living Expenses: Very hard  Food Insecurity: No Food Insecurity (06/25/2023)   [facility]   Hunger Vital Sign    Worried About Running Out of Food in the Last Year: Never true    Ran Out of Food in the Last Year: Never true  Transportation Needs: No Transportation Needs (06/25/2023)   Received from [facility] - Transportation    Lack of Transportation (Medical): No    Lack of Transportation (Non-Medical): No  Physical Activity: Sufficiently Active (06/25/2023)   Received from [facility]   Exercise Vital Sign    Days of Exercise per Week: 5 days    Minutes of Exercise per Session: 60 min  Stress: Stress Concern Present (06/25/2023)   Received from [facility] - Occupational Stress Questionnaire    Feeling of Stress : Very much  Social Connections: Socially Integrated (06/25/2023)   Received from [facility]   Social Network    How would you rate your social network (family, work, friends)?: Good participation with social networks   Family History  Problem Relation Age of Onset   Arthritis Mother    Breast cancer Mother 2   Allergies  Allergen Reactions   Xarelto [Rivaroxaban] Other (See Comments)     exacerbated her fibromyalgia.   Albuterol Other (See Comments)    Inhaler only (nebulizer is fine) - reaction: worsened asthma and bronchitis   Hydrocodone-Acetaminophen Nausea And Vomiting    Can not take Name Brand Vicodin - Generic is Ok    Maxalt [Rizatriptan]     Worsened migraines    Penicillins Other (See Comments)    "raw on my bottom" Has patient had a PCN reaction causing immediate rash, facial/tongue/throat swelling, SOB or lightheadedness with hypotension: No Has patient had a PCN reaction causing severe rash involving mucus membranes or  skin necrosis: No Has patient had a PCN reaction that required hospitalization No Has patient had a PCN reaction occurring within the last 10 years: No If all of the above answers are "NO", then may proceed with Cephalosporin use. Tolerated ancef 08/29/21     Voltaren [Diclofenac Sodium] Other (See Comments)    Stomach ulcers - in pill form, pt is currently using the topical form   Prior to Admission medications   Medication Sig Start Date End Date Taking? Authorizing Provider  acetaminophen (TYLENOL) 500 MG tablet Take 1 tablet (500 mg total) by mouth every 6 (six) hours as needed. 02/16/23  Yes Nanavati, Ankit, MD  amLODipine (NORVASC) 2.5 MG tablet Take 2.5 mg by mouth daily. 06/25/22  Yes [provider]  cetirizine (ZYRTEC) 10 MG tablet Take 10 mg by mouth in the morning.   Yes [provider]  Cholecalciferol (VITAMIN D) 2000 units tablet Take 4,000 Units by mouth daily.   Yes [provider]  cyclobenzaprine (FLEXERIL) 10 MG tablet Take 1 tablet (10 mg total) by mouth 3 (three) times daily as needed for muscle spasms. Patient taking differently: Take 20 mg by mouth at bedtime. 07/11/22  Yes Cassandria Anger, PA-C  esomeprazole (NEXIUM) 40 MG capsule Take 40 mg by mouth in the morning and at bedtime. 07/01/23  Yes [provider]  famotidine (PEPCID) 40 MG tablet Take 40 mg by mouth 2 (two) times daily.   Yes [provider]  fluticasone (FLONASE) 50 MCG/ACT nasal spray Place 2 sprays into both nostrils daily as needed for allergies.   Yes [provider]  Fluticasone-Salmeterol (ADVAIR) 250-50 MCG/DOSE AEPB Inhale 1 puff into the lungs 2 (two) times daily.   Yes [provider]  linaclotide (LINZESS) 72 MCG capsule Take 72 mcg by mouth daily before breakfast.   Yes [provider]  methocarbamol (ROBAXIN-750) 750 MG tablet Take 1 tablet (750 mg total) by mouth 2 (two) times daily as needed for muscle spasms. 07/17/23    Cristie Hem, PA-C  montelukast (SINGULAIR) 10 MG tablet Take 10 mg by mouth in the morning.   Yes Dow Adolph, FNP  Multiple Vitamins-Minerals (HAIR/SKIN/NAILS PO) Take 2 tablets by mouth every morning.    Yes [provider]  Multiple Vitamins-Minerals (MULTIVITAMIN GUMMIES ADULT PO) Take 2 each by mouth every morning.   Yes [provider]  ondansetron (ZOFRAN) 4 MG tablet Take 1 tablet (4 mg total) by mouth every 8 (eight) hours as needed for nausea or vomiting. 07/17/23   Cristie Hem, PA-C  oxybutynin (DITROPAN) 5 MG tablet Take 5 mg by mouth at bedtime. 06/20/22  Yes [provider]  oxyCODONE-acetaminophen (PERCOCET) 5-325 MG tablet Take 1-2 tablets by mouth every 6 (six) hours as needed. To be taken after surgery 07/17/23   Cristie Hem, PA-C  Polyethyl Glycol-Propyl Glycol 0.4-0.3 % SOLN Place 1 drop into both eyes in the morning.   Yes [provider]  pregabalin (LYRICA) 150 MG capsule Take 150 mg by mouth at bedtime.    Yes [provider]  venlafaxine XR (EFFEXOR-XR) 75 MG 24 hr capsule Take 75 mg by mouth daily.   Yes [provider]  vitamin C (ASCORBIC ACID) 250 MG tablet Take 500 mg by mouth daily.   Yes [provider]  apixaban (ELIQUIS) 2.5 MG TABS tablet Take 1 tablet (2.5 mg total) by mouth every 12 (twelve) hours. To be taken after surgery to prevent blood clots 07/17/23 08/21/23  Cristie Hem, PA-C  ipratropium (ATROVENT HFA) 17 MCG/ACT inhaler Inhale 2 puffs into the lungs every 6 (six) hours as needed for wheezing.     Carolin Coy, MD  predniSONE (STERAPRED UNI-PAK 21 TAB) 10 MG (21) TBPK tablet Take by mouth daily. Take 6 tabs by mouth daily  for 2 days, then 5 tabs for 2 days, then 4 tabs for 2 days, then 3 tabs for 2 days, 2 tabs for 2 days, then 1 tab by mouth daily for 2 days Patient not taking: Reported on 07/05/2023 02/16/23   Derwood Kaplan, MD  tiZANidine (ZANAFLEX) 4 MG capsule  Take 1 capsule (4 mg total) by mouth 3 (three) times daily. Patient not taking: Reported on 07/05/2023 02/16/23   Derwood Kaplan, MD     Positive ROS: All other systems have been reviewed and were otherwise negative with the exception of those mentioned in the HPI and as above.  Physical Exam: General: Alert, no acute distress Cardiovascular: No pedal edema Respiratory: No cyanosis, no use of accessory musculature GI: abdomen soft Skin: No lesions in the area of chief complaint Neurologic: Sensation intact distally Psychiatric: Patient is competent for consent with normal mood and affect Lymphatic: no lymphedema  MUSCULOSKELETAL: exam stable  Assessment: left hip osteoathritis  Plan: Plan for Procedure(s): TOTAL HIP ARTHROPLASTY ANTERIOR APPROACH  The risks benefits and alternatives were discussed with the patient including but not limited to the risks of nonoperative treatment, versus surgical intervention including infection, bleeding, nerve injury,  blood clots, cardiopulmonary complications, morbidity, mortality, among others, and they were willing to proceed.   Glee Arvin, MD 07/25/2023 5:48 AM

## 2023-07-25 NOTE — Op Note (Addendum)
TOTAL HIP ARTHROPLASTY ANTERIOR APPROACH  Procedure Note JENNESS JABLONOWSKI   [id number]  Pre-op Diagnosis: left hip osteoathritis     Post-op Diagnosis: same  Operative Findings Cystic changes of the femoral head Loss of articular cartilage   Operative Procedures  1. Total hip replacement; Left hip; uncemented [id number]   Surgeon: Gershon Mussel, M.D.  Assist: Oneal Grout, PA-C   Anesthesia: spinal  Prosthesis: Depuy Acetabulum: Pinnacle 50 mm Femur: Actis 3 HO Head: 32 mm size: +1 Liner: +4 Bearing Type: ceramic/poly  Total Hip Arthroplasty (Anterior Approach) Op Note:  After informed consent was obtained and the operative extremity marked in the holding area, the patient was brought back to the operating room and placed supine on the HANA table. Next, the operative extremity was prepped and draped in normal sterile fashion. Surgical timeout occurred verifying patient identification, surgical site, surgical procedure and administration of antibiotics.  A 10 cm longitudinal incision was made starting from 2 fingerbreadths lateral and inferior to the ASIS towards the lateral aspect of the patella.  A Hueter approach to the hip was performed, using the interval between tensor fascia lata and sartorius.  Dissection was carried bluntly down onto the anterior hip capsule. The lateral femoral circumflex vessels were identified and coagulated. A capsulectomy was performed.  The neck osteotomy was performed. The femoral head was removed which showed severe wear, the acetabular rim was cleared of soft tissue and osteophytes and attention was turned to reaming the acetabulum.   In order to improve exposure and visualization, I performed soft tissue releases for the femur prior to reaming.  The femoral hook was placed, the leg was taken to 90 degrees externally rotated, extended and adducted position taking care to perform soft tissue releases to allow for adequate mobilization of the femur.  Soft tissue was cleared from the shoulder of the greater trochanter and the hook elevator used to improve exposure of the proximal femur.  This allowed the femur to fall posteriorly which improved exposure for acetabular preparation.   Sequential reaming was performed under fluoroscopic guidance down to the floor of the cotyloid fossa. We reamed to a size 49 mm, and then impacted the acetabular shell. A 20 mm cancellous screw was placed to secure the shell.  The liner was then placed after irrigation and attention turned to the femur.  After placing the femoral hook, the leg was externally rotated, extended and adducted to deliver the proximal femur.  Sequential broaching performed up to a size 3. Trial neck and head were placed. The leg was brought back up to neutral and the construct reduced.  The position and sizing of components, offset and leg lengths were checked using fluoroscopy. Stability of the construct was checked in 45 degrees of hip extension and 90 degrees of external rotation without any subluxation, shuck or impingement of prosthesis. We dislocated the prosthesis, dropped the leg back into position, removed trial components, and irrigated copiously. The final stem and head was then placed, the leg brought back up, the system reduced and fluoroscopy used to verify positioning.  Antibiotic irrigation was placed in the surgical wound.   We irrigated, obtained hemostasis and closed the capsule using #2 ethibond suture.  A topical mixture of 0.25% bupivacaine and meloxicam was placed deep to the fascia.  One gram of vancomycin powder was placed in the surgical bed.   One gram of topical tranexamic acid was injected into the joint.  The fascia was closed with #1 vicryl plus,  the deep fat layer was closed with 0 vicryl.  Cellerate powder was placed throughout the subcutaneous tissue in order to promote healing and reduce dead space.  The subcutaneous layers closed with 2.0 Vicryl Plus and the skin  closed with 2.0 nylon and dermabond.  A sterile dressing was applied. The patient was awakened in the operating room and taken to recovery in stable condition.  All sponge, needle, and instrument counts were correct at the end of the case.   Tessa Lerner, my PA, was a medical necessity for opening, closing, limb positioning, retracting, exposing, and overall facilitation and timely completion of the surgery.  Position: supine  Complications: see description of procedure.  Time Out: performed   Drains/Packing: none  Estimated blood loss: see anesthesia record  Returned to Recovery Room: in good condition.   Antibiotics: yes   Mechanical VTE (DVT) Prophylaxis: sequential compression devices, TED thigh-high  Chemical VTE (DVT) Prophylaxis: resume eliquis  Fluid Replacement: see anesthesia record  Specimens Removed: 1 to pathology   Sponge and Instrument Count Correct? yes   PACU: portable radiograph - low AP   Plan/RTC: Return in 2 weeks for staple removal. Weight Bearing/Load Lower Extremity: full  Hip precautions: none Suture Removal: 2 weeks   N. Glee Arvin, MD Aldean Baker 9:00 AM   Implant Name Type Inv. Item Serial No. Manufacturer Lot No. LRB No. Used Action  PINNACLE PLUS 4 NEUTRAL - [id number] Hips PINNACLE PLUS 4 NEUTRAL  [employer] [id number] Left 1 Implanted  CUP SECTOR GRIPTON - [id number] Cup CUP SECTOR GRIPTON  [employer] [id number] Left 1 Implanted  SCREW PINN CAN 6.5X20 - [id number] Screw SCREW PINN CAN 6.5X20  [employer] [id number] Left 1 Implanted  STEM FEM ACTIS HIGH SZ3 - [id number] Stem STEM FEM ACTIS HIGH SZ3  [employer] [id number] Left 1 Implanted  HEAD FEMORAL 32 CERAMIC - [id number] Hips HEAD FEMORAL 32 CERAMIC  [employer] [id number] Left 1 Implanted

## 2023-07-25 NOTE — Anesthesia Postprocedure Evaluation (Signed)
Anesthesia Post Note  Patient: ADUT AEGERTER  Procedure(s) Performed: TOTAL HIP ARTHROPLASTY ANTERIOR APPROACH (Left: Hip)     Patient location during evaluation: PACU Anesthesia Type: Spinal Level of consciousness: oriented and awake and alert Pain management: pain level controlled Vital Signs Assessment: post-procedure vital signs reviewed and stable Respiratory status: spontaneous breathing, respiratory function stable and nonlabored ventilation Cardiovascular status: blood pressure returned to baseline and stable Postop Assessment: no headache, no backache, no apparent nausea or vomiting, spinal receding and patient able to bend at knees Anesthetic complications: no   No notable events documented.  Last Vitals:  Vitals:   07/25/23 1045 07/25/23 1100  BP: (!) 144/91 (!) 153/83  Pulse: 85 85  Resp: 14 12  Temp:  36.6 C  SpO2: 100% 100%    Last Pain:  Vitals:   07/25/23 1100  TempSrc:   PainSc: 0-No pain            L Sensory Level: S1-Sole of foot, small toes (07/25/23 1100) R Sensory Level: S1-Sole of foot, small toes (07/25/23 1100)  Aseel Uhde A.

## 2023-07-25 NOTE — Anesthesia Procedure Notes (Signed)
Spinal  Patient location during procedure: OR Start time: 07/25/2023 7:25 AM End time: 07/25/2023 7:29 AM Reason for block: surgical anesthesia Staffing Performed: anesthesiologist  Anesthesiologist: Mal Amabile, MD Performed by: Mal Amabile, MD Authorized by: Mal Amabile, MD   Preanesthetic Checklist Completed: patient identified, IV checked, site marked, risks and benefits discussed, surgical consent, monitors and equipment checked, pre-op evaluation and timeout performed Spinal Block Patient position: sitting Prep: DuraPrep and site prepped and draped Patient monitoring: heart rate, cardiac monitor, continuous pulse ox and blood pressure Approach: midline Location: L3-4 Injection technique: single-shot Needle Needle type: Pencan  Needle gauge: 24 G Needle length: 9 cm Needle insertion depth: 7 cm Assessment Sensory level: T4 Events: CSF return Additional Notes Patient tolerated procedure well. Adequate sensory level.

## 2023-07-26 ENCOUNTER — Encounter (HOSPITAL_COMMUNITY): Payer: Self-pay | Admitting: Orthopaedic Surgery

## 2023-07-26 DIAGNOSIS — M1612 Unilateral primary osteoarthritis, left hip: Secondary | ICD-10-CM | POA: Diagnosis not present

## 2023-07-26 NOTE — Progress Notes (Addendum)
Patient alert and oriented, voiding adequately, skin clean, dry and intact without evidence of skin break down, or symptoms of complications - no redness or edema noted, only slight tenderness at site.  Patient states pain is manageable at time of discharge. Patient has an appointment with MD in 2 weeks. Patient was waiting for her daughter for ride home

## 2023-07-26 NOTE — Progress Notes (Signed)
Subjective: 1 Day Post-Op Procedure(s) (LRB): TOTAL HIP ARTHROPLASTY ANTERIOR APPROACH (Left) Patient reports pain as mild.    Objective: Vital signs in last 24 hours: Temp:  [97.5 F (36.4 C)-98.2 F (36.8 C)] 98 F (36.7 C) (11/01 0307) Pulse Rate:  [84-109] 89 (11/01 0307) Resp:  [12-20] 18 (11/01 0307) BP: (118-162)/(83-103) 145/93 (11/01 0307) SpO2:  [97 %-100 %] 98 % (11/01 0307)  Intake/Output from previous day: 10/31 0701 - 11/01 0700 In: 1710 [P.O.:960; I.V.:750] Out: 2150 [Urine:2000; Blood:150] Intake/Output this shift: No intake/output data recorded.  No results for input(s): "HGB" in the last 72 hours. No results for input(s): "WBC", "RBC", "HCT", "PLT" in the last 72 hours. No results for input(s): "NA", "K", "CL", "CO2", "BUN", "CREATININE", "GLUCOSE", "CALCIUM" in the last 72 hours. No results for input(s): "LABPT", "INR" in the last 72 hours.  Neurologically intact Neurovascular intact Sensation intact distally Intact pulses distally Dorsiflexion/Plantar flexion intact Incision: dressing C/D/I No cellulitis present Compartment soft   Assessment/Plan: 1 Day Post-Op Procedure(s) (LRB): TOTAL HIP ARTHROPLASTY ANTERIOR APPROACH (Left) Advance diet Up with therapy D/C IV fluids Discharge home with home health once cleared by PT WBAT LLE     Cristie Hem 07/26/2023, 7:51 AM

## 2023-07-26 NOTE — Evaluation (Signed)
Occupational Therapy Evaluation Patient Details Name: Christine Bradley MRN: [id number] DOB: 06-Jan-1966 Today's Date: 07/26/2023   History of Present Illness 57 yo female s/p L DA-THA on 10/31. PMH includes R THA, bilat TKR 2023, fibromyalgia, HTN, IBS.   Clinical Impression   Semiah was evaluated s/p the above THA. She is mod I with SPC at baseline. Upon evaluation pt was limited by mild hip pain and decreased activity tolerance. Overall pt completed bed mobility, transfers and functional mobility with supervision A and RW. Pt was educated on compensatory techniques for LB ADLs including AE options and pt demonstrated superivsion A for LB ADLs and mod I for UB ADLs. Educated pt on importance to use DME as needed during ADLs to reduce fall risk and encouraged ice packs for pain management. Pt verbalized great understanding of all education.  Recommend pt follow the surgeons recommendation for follow up therapies.         If plan is discharge home, recommend the following: Assist for transportation;Assistance with cooking/housework    Functional Status Assessment  Patient has had a recent decline in their functional status and demonstrates the ability to make significant improvements in function in a reasonable and predictable amount of time.  Equipment Recommendations  None recommended by OT       Precautions / Restrictions Precautions Precautions: Fall Restrictions Weight Bearing Restrictions: No      Mobility Bed Mobility Overal bed mobility: Needs Assistance             General bed mobility comments: OOB upon arrival    Transfers Overall transfer level: Needs assistance Equipment used: Rolling walker (2 wheels) Transfers: Sit to/from Stand Sit to Stand: Min assist                  Balance Overall balance assessment: Needs assistance Sitting-balance support: Feet supported, No upper extremity supported Sitting balance-Leahy Scale: Fair     Standing balance support:  No upper extremity supported, During functional activity Standing balance-Leahy Scale: Fair                             ADL either performed or assessed with clinical judgement   ADL Overall ADL's : Needs assistance/impaired Eating/Feeding: Independent   Grooming: Modified independent;Standing   Upper Body Bathing: Modified independent   Lower Body Bathing: Supervison/ safety;Sit to/from stand   Upper Body Dressing : Modified independent   Lower Body Dressing: Supervision/safety;Sit to/from stand   Toilet Transfer: Supervision/safety;Ambulation;Comfort height toilet   Toileting- Clothing Manipulation and Hygiene: Modified independent;Sitting/lateral lean       Functional mobility during ADLs: Supervision/safety;Rolling walker (2 wheels) General ADL Comments: cues for compensatory techniques; pt stood at sink to complete most ADLs with no safety concern or increase in pain. LB ADLs completed in sitting with cues and supervision A     Vision Baseline Vision/History: 1 Wears glasses Vision Assessment?: No apparent visual deficits     Perception Perception: Within Functional Limits       Praxis Praxis: WFL       Pertinent Vitals/Pain Pain Assessment Pain Assessment: Faces Faces Pain Scale: Hurts a little bit Pain Location: L hip Pain Descriptors / Indicators: Discomfort, Grimacing, Guarding Pain Intervention(s): Limited activity within patient's tolerance, Premedicated before session     Extremity/Trunk Assessment Upper Extremity Assessment Upper Extremity Assessment: Overall WFL for tasks assessed   Lower Extremity Assessment Lower Extremity Assessment: Defer to PT evaluation   Cervical /  Trunk Assessment Cervical / Trunk Assessment: Normal   Communication Communication Communication: No apparent difficulties   Cognition Arousal: Alert Behavior During Therapy: WFL for tasks assessed/performed Overall Cognitive Status: Within Functional Limits  for tasks assessed               General Comments  VSS on RA     Home Living Family/patient expects to be discharged to:: Private residence Living Arrangements: Parent Available Help at Discharge: Family;Available 24 hours/day Type of Home: Apartment Home Access: Level entry     Home Layout: One level     Bathroom Shower/Tub: Chief Strategy Officer: Standard     Home Equipment: Agricultural consultant (2 wheels);Cane - single point;BSC/3in1          Prior Functioning/Environment Prior Level of Function : Independent/Modified Independent             Mobility Comments: SPC PTA due to pain ADLs Comments: mod I, had to stop working ~3 weeks ago.        OT Problem List: Decreased range of motion;Decreased activity tolerance;Impaired balance (sitting and/or standing);Decreased knowledge of precautions         OT Goals(Current goals can be found in the care plan section) Acute Rehab OT Goals Patient Stated Goal: home OT Goal Formulation: With patient Time For Goal Achievement: 07/26/23 Potential to Achieve Goals: Good   AM-PAC OT "6 Clicks" Daily Activity     Outcome Measure Help from another person eating meals?: None Help from another person taking care of personal grooming?: None Help from another person toileting, which includes using toliet, bedpan, or urinal?: A Little Help from another person bathing (including washing, rinsing, drying)?: A Little Help from another person to put on and taking off regular upper body clothing?: None Help from another person to put on and taking off regular lower body clothing?: A Little 6 Click Score: 21   End of Session Equipment Utilized During Treatment: Rolling walker (2 wheels) Nurse Communication: Mobility status  Activity Tolerance: Patient tolerated treatment well Patient left: in chair;with call bell/phone within reach  OT Visit Diagnosis: Unsteadiness on feet (R26.81);Other abnormalities of gait and  mobility (R26.89);Muscle weakness (generalized) (M62.81)                Time: 5409-8119 OT Time Calculation (min): 26 min Charges:  OT General Charges $OT Visit: 1 Visit OT Evaluation $OT Eval Low Complexity: 1 Low OT Treatments $Self Care/Home Management : 8-22 mins  Derenda Mis, OTR/L Acute Rehabilitation Services Office [phone] Secure Chat Communication Preferred   Donia Pounds 07/26/2023, 9:52 AM

## 2023-07-26 NOTE — Discharge Summary (Signed)
Patient ID: Christine Bradley MRN: [id number] DOB/AGE: [birth year] 57 y.o.  Admit date: 07/25/2023 Discharge date: 07/26/2023  Admission Diagnoses:  Principal Problem:   Primary osteoarthritis of left hip Active Problems:   Status post total replacement of left hip   Discharge Diagnoses:  Same  Past Medical History:  Diagnosis Date   Anxiety    Arthritis    Carpal tunnel syndrome    Chronic bronchitis (HCC)    Chronic kidney disease    COPD (chronic obstructive pulmonary disease) (HCC)    Fibromyalgia    GERD (gastroesophageal reflux disease)    Headache    Chronic migraines   Hypertension    Hyperthyroidism    IBS (irritable bowel syndrome)    IC (interstitial cystitis)    Neuromuscular disorder (HCC)    fibromyalgia   Pneumonia 2001   hx of x 2   Pre-diabetes    Psoriatic arthritis (HCC)    Sleep apnea     Surgeries: Procedure(s): TOTAL HIP ARTHROPLASTY ANTERIOR APPROACH on 07/25/2023   Consultants:   Discharged Condition: Improved  Hospital Course: Christine Bradley is an 57 y.o. female who was admitted 07/25/2023 for operative treatment ofPrimary osteoarthritis of left hip. Patient has severe unremitting pain that affects sleep, daily activities, and work/hobbies. After pre-op clearance the patient was taken to the operating room on 07/25/2023 and underwent  Procedure(s): TOTAL HIP ARTHROPLASTY ANTERIOR APPROACH.    Patient was given perioperative antibiotics:  Anti-infectives (From admission, onward)    Start     Dose/Rate Route Frequency Ordered Stop   07/26/23 1000  doxycycline (VIBRA-TABS) tablet 100 mg        100 mg Oral Every 12 hours 07/25/23 1123     07/25/23 1330  ceFAZolin (ANCEF) IVPB 2g/100 mL premix        2 g 200 mL/hr over 30 Minutes Intravenous Every 6 hours 07/25/23 1055 07/25/23 1915   07/25/23 0808  vancomycin (VANCOCIN) powder  Status:  Discontinued          As needed 07/25/23 0809 07/25/23 0931   07/25/23 0711  ceFAZolin (ANCEF) 2-4 GM/100ML-%  IVPB       Note to Pharmacy: Shanda Bumps M: cabinet override      07/25/23 0711 07/25/23 1929   07/25/23 0000  doxycycline (VIBRA-TABS) 100 MG tablet        100 mg Oral 2 times daily 07/25/23 9811          Patient was given sequential compression devices, early ambulation, and chemoprophylaxis to prevent DVT.  Patient benefited maximally from hospital stay and there were no complications.    Recent vital signs: Patient Vitals for the past 24 hrs:  BP Temp Temp src Pulse Resp SpO2  07/26/23 0307 (!) 145/93 98 F (36.7 C) Oral 89 18 98 %  07/25/23 2351 (!) 138/93 98.2 F (36.8 C) Oral (!) 102 18 97 %  07/25/23 1924 (!) 143/87 98 F (36.7 C) Oral 91 18 99 %  07/25/23 1540 (!) 151/98 -- -- (!) 104 -- --  07/25/23 1539 (!) 162/98 -- -- (!) 109 18 100 %  07/25/23 1123 (!) 135/90 97.8 F (36.6 C) -- 96 20 100 %  07/25/23 1100 (!) 153/83 97.8 F (36.6 C) -- 85 12 100 %  07/25/23 1045 (!) 144/91 -- -- 85 14 100 %  07/25/23 1030 (!) 137/96 -- -- 89 17 100 %  07/25/23 1015 (!) 145/103 -- -- 85 16 99 %  07/25/23 1000 (!) 118/91 -- --  86 20 97 %  07/25/23 0945 137/87 -- -- 84 15 99 %  07/25/23 0934 136/88 (!) 97.5 F (36.4 C) -- 87 12 97 %     Recent laboratory studies: No results for input(s): "WBC", "HGB", "HCT", "PLT", "NA", "K", "CL", "CO2", "BUN", "CREATININE", "GLUCOSE", "INR", "CALCIUM" in the last 72 hours.  Invalid input(s): "PT", "2"   Discharge Medications:   Allergies as of 07/26/2023       Reactions   Xarelto [rivaroxaban] Other (See Comments)    exacerbated her fibromyalgia.   Albuterol Other (See Comments)   Inhaler only (nebulizer is fine) - reaction: worsened asthma and bronchitis   Hydrocodone-acetaminophen Nausea And Vomiting   Can not take Name Brand Vicodin - Generic is Ok    Maxalt [rizatriptan]    Worsened migraines    Penicillins Other (See Comments)   "raw on my bottom" Has patient had a PCN reaction causing immediate rash, facial/tongue/throat  swelling, SOB or lightheadedness with hypotension: No Has patient had a PCN reaction causing severe rash involving mucus membranes or skin necrosis: No Has patient had a PCN reaction that required hospitalization No Has patient had a PCN reaction occurring within the last 10 years: No If all of the above answers are "NO", then may proceed with Cephalosporin use. Tolerated ancef 08/29/21   Voltaren [diclofenac Sodium] Other (See Comments)   Stomach ulcers - in pill form, pt is currently using the topical form        Medication List     STOP taking these medications    acetaminophen 500 MG tablet Commonly known as: TYLENOL   cyclobenzaprine 10 MG tablet Commonly known as: FLEXERIL   predniSONE 10 MG (21) Tbpk tablet Commonly known as: STERAPRED UNI-PAK 21 TAB   tiZANidine 4 MG capsule Commonly known as: Zanaflex       TAKE these medications    amLODipine 2.5 MG tablet Commonly known as: NORVASC Take 2.5 mg by mouth daily.   apixaban 2.5 MG Tabs tablet Commonly known as: ELIQUIS Take 1 tablet (2.5 mg total) by mouth every 12 (twelve) hours. To be taken after surgery to prevent blood clots   cetirizine 10 MG tablet Commonly known as: ZYRTEC Take 10 mg by mouth in the morning.   doxycycline 100 MG tablet Commonly known as: VIBRA-TABS Take 1 tablet (100 mg total) by mouth 2 (two) times daily.   esomeprazole 40 MG capsule Commonly known as: NEXIUM Take 40 mg by mouth in the morning and at bedtime.   famotidine 40 MG tablet Commonly known as: PEPCID Take 40 mg by mouth 2 (two) times daily.   fluticasone 50 MCG/ACT nasal spray Commonly known as: FLONASE Place 2 sprays into both nostrils daily as needed for allergies.   Fluticasone-Salmeterol 250-50 MCG/DOSE Aepb Commonly known as: ADVAIR Inhale 1 puff into the lungs 2 (two) times daily.   HAIR/SKIN/NAILS PO Take 2 tablets by mouth every morning.   ipratropium 17 MCG/ACT inhaler Commonly known as: ATROVENT  HFA Inhale 2 puffs into the lungs every 6 (six) hours as needed for wheezing.   linaclotide 72 MCG capsule Commonly known as: LINZESS Take 72 mcg by mouth daily before breakfast.   methocarbamol 750 MG tablet Commonly known as: Robaxin-750 Take 1 tablet (750 mg total) by mouth 2 (two) times daily as needed for muscle spasms.   montelukast 10 MG tablet Commonly known as: SINGULAIR Take 10 mg by mouth in the morning.   MULTIVITAMIN GUMMIES ADULT PO Take 2  each by mouth every morning.   ondansetron 4 MG tablet Commonly known as: Zofran Take 1 tablet (4 mg total) by mouth every 8 (eight) hours as needed for nausea or vomiting.   oxybutynin 5 MG tablet Commonly known as: DITROPAN Take 5 mg by mouth at bedtime.   oxyCODONE-acetaminophen 5-325 MG tablet Commonly known as: Percocet Take 1-2 tablets by mouth every 6 (six) hours as needed. To be taken after surgery   Polyethyl Glycol-Propyl Glycol 0.4-0.3 % Soln Place 1 drop into both eyes in the morning.   pregabalin 150 MG capsule Commonly known as: LYRICA Take 150 mg by mouth at bedtime.   venlafaxine XR 75 MG 24 hr capsule Commonly known as: EFFEXOR-XR Take 75 mg by mouth daily.   vitamin C 250 MG tablet Commonly known as: ASCORBIC ACID Take 500 mg by mouth daily.   Vitamin D 50 MCG (2000 UT) tablet Take 4,000 Units by mouth daily.               Durable Medical Equipment  (From admission, onward)           Start     Ordered   07/25/23 1124  DME Walker rolling  Once       Question:  Patient needs a walker to treat with the following condition  Answer:  History of hip replacement   07/25/23 1123   07/25/23 1124  DME Bedside commode  Once       Question:  Patient needs a bedside commode to treat with the following condition  Answer:  History of hip replacement   07/25/23 1123   07/25/23 1056  DME 3 n 1  Once        07/25/23 1055            Diagnostic Studies: DG Pelvis Portable  Result Date:  07/25/2023 CLINICAL DATA:  Postoperative left hip arthroplasty. EXAM: PORTABLE PELVIS 1-2 VIEWS COMPARISON:  AP pelvis 05/16/2023 FINDINGS: Interval total left hip arthroplasty. Redemonstration of total right hip arthroplasty. No perihardware lucency is seen to indicate hardware failure or loosening. Expected postoperative lateral left hip subcutaneous air. No acute fracture or dislocation. IMPRESSION: Interval total left hip arthroplasty without evidence of hardware failure. Electronically Signed   By: Neita Garnet M.D.   On: 07/25/2023 11:52   DG HIP UNILAT WITH PELVIS 1V LEFT  Result Date: 07/25/2023 CLINICAL DATA:  Total left hip arthroplasty. Anterior approach. Intraoperative fluoroscopy. EXAM: DG HIP (WITH OR WITHOUT PELVIS) 1V*L* COMPARISON:  AP pelvis 08/11/2021, 08/29/2021 and 05/16/2023 FINDINGS: Images were performed intraoperatively without the presence of a radiologist. Partial visualization of prior total right hip arthroplasty. The patient is undergoing new total left hip arthroplasty. No hardware complication is seen. Total fluoroscopy images: 5 Total fluoroscopy time: 38 seconds Total dose: Radiation Exposure Index (as provided by the fluoroscopic device): 7.9 mGy air Kerma Please see intraoperative findings for further detail. IMPRESSION: Intraoperative fluoroscopy for total left hip arthroplasty. Electronically Signed   By: Neita Garnet M.D.   On: 07/25/2023 11:52   DG C-Arm 1-60 Min-No Report  Result Date: 07/25/2023 Fluoroscopy was utilized by the requesting physician.  No radiographic interpretation.   DG C-Arm 1-60 Min-No Report  Result Date: 07/25/2023 Fluoroscopy was utilized by the requesting physician.  No radiographic interpretation.   DG BONE DENSITY (DXA)  Result Date: 07/22/2023 EXAM: DUAL X-RAY ABSORPTIOMETRY (DXA) FOR BONE MINERAL DENSITY IMPRESSION: Referring Physician:  Macy Mis Your patient completed a bone mineral density test  using [employer]  system (analysis version: 16). Technologist: BEC PATIENT: Name: Christine, Bradley Patient ID: [id number] Birth Date: [birth year] Height: 65.0 in. Sex: Female Measured: 07/22/2023 Weight: 216.6 lbs. Indications: Bilateral Ovariectomy, Effexor, Estrogen Deficient, Height Loss (781.91), High risk medication use, History of Osteopenia, Hysterectomy, Low Calcium Intake, Lryica, Pepcid, Postmenopausal, Protonix, Prozac, Scoliosis, Secondary Osteoporosis, Topamax Fractures: None Treatments: Multivitamin, Vitamin D (E933.5) ASSESSMENT: The BMD measured at Femur Neck is 0.928 g/cm2 with a T-score of -0.8. This patient is considered normal according to World Health Organization [facility]) criteria. Right hip excluded due to surgical hardware. The quality of the exam is limited by patient's limited movement left hip for measurement. Site Region Measured Date Measured Age [age]A BMD Significant CHANGE T-score AP Spine L1-L4 07/22/2023 58.4 -0.1 1.181 g/cm2 Left Femur Neck 07/22/2023 58.4 -0.7 0.928 g/cm2 * Left Femur Neck 10[birth year]29/2017 52.7 -0.3 0.978 g/cm2 Left Forearm Radius 33% 07[age]22/2023 58.4 0.6 0.933 g/cm2 World Health Organization [facility]) criteria for post-menopausal, Caucasian Women: Normal       T-score at or above -1 SD Osteopenia   T-score between -1 and -2.5 SD Osteoporosis T-score at or below -2.5 SD RECOMMENDATION: 1. All patients should optimize calcium and vitamin D intake. 2. Consider FDA-approved medical therapies in postmenopausal women and men aged 77 years and older, based on the following: a. A hip or vertebral (clinical or morphometric) fracture. b. T-score = -2.5 at the femoral neck or spine after appropriate evaluation to exclude secondary causes. c. Low bone mass (T-score between -1.0 and -2.5 at the femoral neck or spine) and a 10-year probability of a hip fracture = 3% or a 10-year probability of a major osteoporosis-related fracture = 20% based on the [place]-adapted WHO algorithm. d. Clinician judgment and/or patient  preferences may indicate treatment for people with 10-year fracture probabilities above or below these levels. FOLLOW-UP: Patients with diagnosis of osteoporosis or at high risk for fracture should have regular bone mineral density tests.? Patients eligible for Medicare are allowed routine testing every 2 years.? The testing frequency can be increased to one year for patients who have rapidly progressing disease, are receiving or discontinuing medical therapy to restore bone mass, or have additional risk factors. I have reviewed this study and agree with the findings. [facility] Electronically Signed   By: Baird Lyons M.D.   On: 07/22/2023 14:13    Disposition: Discharge disposition: 01-Home or Self Care          Follow-up Information     Cristie Hem, PA-C. Schedule an appointment as soon as possible for a visit in 2 week(s).   Specialty: Orthopedic Surgery Contact information: [address] [place] [address] [phone]         [facility] Follow up.   Why: The home health agency will contact you for the first home visit Contact information: [address] [place] [address] [phone]                  Signed: Cristie Hem 07/26/2023, 7:51 AM

## 2023-07-26 NOTE — Plan of Care (Signed)
  Problem: Education: Goal: Knowledge of General Education information will improve Description: Including pain rating scale, medication(s)/side effects and non-pharmacologic comfort measures Outcome: Completed/Met   Problem: Health Behavior/Discharge Planning: Goal: Ability to manage health-related needs will improve Outcome: Completed/Met   Problem: Clinical Measurements: Goal: Ability to maintain clinical measurements within normal limits will improve Outcome: Completed/Met Goal: Will remain free from infection Outcome: Completed/Met Goal: Diagnostic test results will improve Outcome: Completed/Met Goal: Respiratory complications will improve Outcome: Completed/Met Goal: Cardiovascular complication will be avoided Outcome: Completed/Met   Problem: Activity: Goal: Risk for activity intolerance will decrease Outcome: Completed/Met   Problem: Nutrition: Goal: Adequate nutrition will be maintained Outcome: Completed/Met   Problem: Coping: Goal: Level of anxiety will decrease Outcome: Completed/Met   Problem: Elimination: Goal: Will not experience complications related to bowel motility Outcome: Completed/Met Goal: Will not experience complications related to urinary retention Outcome: Completed/Met   Problem: Pain Management: Goal: General experience of comfort will improve Outcome: Completed/Met   Problem: Safety: Goal: Ability to remain free from injury will improve Outcome: Completed/Met   Problem: Skin Integrity: Goal: Risk for impaired skin integrity will decrease Outcome: Completed/Met   Problem: Education: Goal: Knowledge of the prescribed therapeutic regimen will improve Outcome: Completed/Met Goal: Understanding of discharge needs will improve Outcome: Completed/Met Goal: Individualized Educational Video(s) Outcome: Completed/Met   Problem: Activity: Goal: Ability to avoid complications of mobility impairment will improve Outcome:  Completed/Met Goal: Ability to tolerate increased activity will improve Outcome: Completed/Met   Problem: Clinical Measurements: Goal: Postoperative complications will be avoided or minimized Outcome: Completed/Met   Problem: Pain Management: Goal: Pain level will decrease with appropriate interventions Outcome: Completed/Met   Problem: Skin Integrity: Goal: Will show signs of wound healing Outcome: Completed/Met   Problem: Acute Rehab PT Goals(only PT should resolve) Goal: Pt Will Go Supine/Side To Sit Outcome: Completed/Met Goal: Patient Will Transfer Sit To/From Stand Outcome: Completed/Met Goal: Pt Will Transfer Bed To Chair/Chair To Bed Outcome: Completed/Met Goal: Pt Will Ambulate Outcome: Completed/Met Goal: Pt/caregiver will Perform Home Exercise Program Outcome: Completed/Met

## 2023-07-26 NOTE — Progress Notes (Signed)
Physical Therapy Treatment Patient Details Name: Christine Bradley MRN: [id number] DOB: [birth year] Today's Date: 07/26/2023   History of Present Illness 57 yo female s/p L DA-THA on 10/31. PMH includes R THA, bilat TKR 2023, fibromyalgia, HTN, IBS.    PT Comments  The pt was able to make good progress with hallway ambulation and independence with transfers this morning. She completed ~250 ft hallway ambulation with supervision and use of RW, and was able to complete standing exercises for L hip ROM and strength with CGA and use of RW. Pt verbalizes understanding of instructions, exercise technique, and safety. Is safe for return home when medically stable, but will benefit from skilled PT to progress functional strength and stability around R hip replacement.     If plan is discharge home, recommend the following: A little help with walking and/or transfers;A little help with bathing/dressing/bathroom   Can travel by private vehicle        Equipment Recommendations  None recommended by PT    Recommendations for Other Services       Precautions / Restrictions Precautions Precautions: Fall Restrictions Weight Bearing Restrictions: No     Mobility  Bed Mobility Overal bed mobility: Needs Assistance             General bed mobility comments: pt sitting EOB at start of session    Transfers Overall transfer level: Needs assistance Equipment used: Rolling walker (2 wheels) Transfers: Sit to/from Stand Sit to Stand: Supervision           General transfer comment: supervision for safety, no physical assistance    Ambulation/Gait Ambulation/Gait assistance: Contact guard assist, Supervision Gait Distance (Feet): 250 Feet Assistive device: Rolling walker (2 wheels) Gait Pattern/deviations: Step-through pattern, Decreased stride length, Trunk flexed Gait velocity: 0.36 m/s Gait velocity interpretation: <1.31 ft/sec, indicative of household ambulator   General Gait Details:  cues for upright posture, sequencing, placeement in RW.       Balance Overall balance assessment: Needs assistance Sitting-balance support: Feet supported, No upper extremity supported Sitting balance-Leahy Scale: Good     Standing balance support: Single extremity supported, Bilateral upper extremity supported, During functional activity Standing balance-Leahy Scale: Fair Standing balance comment: static stand without UE support, BUE support for gait                            Cognition Arousal: Alert Behavior During Therapy: WFL for tasks assessed/performed Overall Cognitive Status: Within Functional Limits for tasks assessed                                          Exercises Total Joint Exercises Ankle Circles/Pumps: AROM, Both, 10 reps Hip ABduction/ADduction: AROM, Both, 5 reps, Standing Long Arc Quad: AROM, Both, 10 reps, Seated Marching in Standing: AROM, Both, 5 reps, Seated, Standing    General Comments General comments (skin integrity, edema, etc.): VSS on RA      Pertinent Vitals/Pain Pain Assessment Pain Assessment: Faces Faces Pain Scale: Hurts a little bit Pain Location: L hip Pain Descriptors / Indicators: Discomfort, Grimacing, Guarding Pain Intervention(s): Limited activity within patient's tolerance, Monitored during session, Repositioned    Home Living Family/patient expects to be discharged to:: Private residence Living Arrangements: Parent Available Help at Discharge: Family;Available 24 hours/day Type of Home: Apartment Home Access: Level entry  Home Layout: One level Home Equipment: Agricultural consultant (2 wheels);Cane - single point;BSC/3in1          PT Goals (current goals can now be found in the care plan section) Acute Rehab PT Goals Patient Stated Goal: to return home PT Goal Formulation: With patient Time For Goal Achievement: 08/01/23 Potential to Achieve Goals: Good Progress towards PT goals:  Progressing toward goals    Frequency    7X/week       AM-PAC PT "6 Clicks" Mobility   Outcome Measure  Help needed turning from your back to your side while in a flat bed without using bedrails?: A Little Help needed moving from lying on your back to sitting on the side of a flat bed without using bedrails?: A Little Help needed moving to and from a bed to a chair (including a wheelchair)?: A Little Help needed standing up from a chair using your arms (e.g., wheelchair or bedside chair)?: A Little Help needed to walk in hospital room?: A Little Help needed climbing 3-5 steps with a railing? : A Little 6 Click Score: 18    End of Session Equipment Utilized During Treatment: Gait belt Activity Tolerance: Patient tolerated treatment well Patient left: with call bell/phone within reach;in chair Nurse Communication: Mobility status PT Visit Diagnosis: Other abnormalities of gait and mobility (R26.89);Muscle weakness (generalized) (M62.81)     Time: 8657-8469 PT Time Calculation (min) (ACUTE ONLY): 23 min  Charges:    $Gait Training: 8-22 mins $Therapeutic Exercise: 8-22 mins PT General Charges $$ ACUTE PT VISIT: 1 Visit                     Vickki Muff, PT, DPT   Acute Rehabilitation Department Office [phone] Secure Chat Communication Preferred   Ronnie Derby 07/26/2023, 9:15 AM

## 2023-07-30 ENCOUNTER — Telehealth: Payer: Self-pay | Admitting: Orthopaedic Surgery

## 2023-07-30 NOTE — Telephone Encounter (Signed)
Called and gave verbal via voicemail.

## 2023-07-30 NOTE — Telephone Encounter (Signed)
Almira form [facility] called requesting verbal orders for PT 3w1 2w1 starting this week CB [phone] Secure VM

## 2023-08-02 ENCOUNTER — Telehealth: Payer: Self-pay

## 2023-08-02 NOTE — Telephone Encounter (Signed)
Spoke with patient. She said that she did not fall, but almost did. She is hurting more. I encouraged her to rest, ice, and not worry about her exercises for a couple days. If no improvement she can call us Monday. If she gets worse she will contact doctor on call OR go to urgent care/ED.

## 2023-08-02 NOTE — Telephone Encounter (Signed)
If she cannot walk, I would suggest going to urgent care/ed to get xray

## 2023-08-02 NOTE — Telephone Encounter (Signed)
Mark with [facility] called stating that patient almost fell out of bed while trying to ger her phone and hurt her left hip.  Stated that she's having pain at the top center of the thigh and groin area and has some swelling.  Pain is a 10/10.  Patient had left THA on 07/25/2023.  Please advise.  Thank you.

## 2023-08-08 ENCOUNTER — Encounter: Payer: Self-pay | Admitting: Physician Assistant

## 2023-08-08 ENCOUNTER — Ambulatory Visit: Payer: 59 | Admitting: Physician Assistant

## 2023-08-08 DIAGNOSIS — Z96642 Presence of left artificial hip joint: Secondary | ICD-10-CM | POA: Diagnosis not present

## 2023-08-08 MED ORDER — OXYCODONE HCL 5 MG PO TABS: 5.0000 mg | ORAL_TABLET | Freq: Three times a day (TID) | ORAL | 0 refills | Status: DC | PRN

## 2023-08-08 MED ORDER — CYCLOBENZAPRINE HCL 5 MG PO TABS: 5.0000 mg | ORAL_TABLET | Freq: Two times a day (BID) | ORAL | 1 refills | Status: DC | PRN

## 2023-08-08 NOTE — Progress Notes (Signed)
Post-Op Visit Note   Patient: Christine Bradley           Date of Birth: March 28, 1966           MRN: [id number] Visit Date: 08/08/2023 PCP: Macy Mis, MD   Assessment & Plan:  Chief Complaint:  Chief Complaint  Patient presents with   Left Hip - Pain   Visit Diagnoses:  1. Status post total replacement of left hip     Plan: Patient is a pleasant 57 year old female who comes in today 2 weeks status post left total hip replacement 07/25/2023.  She has been doing well.  She is taking Percocet and Robaxin for pain.  She is on Eliquis at baseline.  She has been getting home health physical therapy and is ambulating with a walker.  Examination of her left hip reveals a well-healing surgical incision with nylon sutures in place.  No evidence of infection or cellulitis.  Calves are soft nontender.  She is neurovascularly intact distally.  Today, stitches removed and steri strips applied.  She would like to continue with hhpt.  Continue with eliquis. I have refilled oxycodone and sent in flexeril.  Post-op instructions provided.  Follow up in four weeks for repeat evaluation and AP pelvis xrays.   Follow-Up Instructions: Return in about 4 weeks (around 09/05/2023).   Orders:  No orders of the defined types were placed in this encounter.  Meds ordered this encounter  Medications   cyclobenzaprine (FLEXERIL) 5 MG tablet    Sig: Take 1 tablet (5 mg total) by mouth 2 (two) times daily as needed for muscle spasms.    Dispense:  20 tablet    Refill:  1   oxyCODONE (ROXICODONE) 5 MG immediate release tablet    Sig: Take 1-2 tablets (5-10 mg total) by mouth 3 (three) times daily as needed for severe pain (pain score 7-10).    Dispense:  40 tablet    Refill:  0    Imaging: No new imaging  PMFS History: Patient Active Problem List   Diagnosis Date Noted   Primary osteoarthritis of left hip 07/25/2023   Status post total replacement of left hip 07/25/2023   S/P total knee arthroplasty,  left 07/10/2022   Microcytic anemia 02/20/2022   Acute deep vein thrombosis (DVT) of distal vein of right lower extremity (HCC) 12/08/2021   S/P total knee arthroplasty, right 11/07/2021   S/P total right hip arthroplasty 08/29/2021   Past Medical History:  Diagnosis Date   Anxiety    Arthritis    Carpal tunnel syndrome    Chronic bronchitis (HCC)    Chronic kidney disease    COPD (chronic obstructive pulmonary disease) (HCC)    Fibromyalgia    GERD (gastroesophageal reflux disease)    Headache    Chronic migraines   Hypertension    Hyperthyroidism    IBS (irritable bowel syndrome)    IC (interstitial cystitis)    Neuromuscular disorder (HCC)    fibromyalgia   Pneumonia 2001   hx of x 2   Pre-diabetes    Psoriatic arthritis (HCC)    Sleep apnea     Family History  Problem Relation Age of Onset   Arthritis Mother    Breast cancer Mother 54    Past Surgical History:  Procedure Laterality Date   ABDOMINAL HYSTERECTOMY  2004   BREAST EXCISIONAL BIOPSY Left 12/31/2017   foot surgery  1997   HERNIA REPAIR  1996   RADIOACTIVE SEED GUIDED  EXCISIONAL BREAST BIOPSY Left 12/31/2017   Procedure: LEFT BREAST SEED GUIDED EXCISIONAL BIOPSY;  Surgeon: Abigail Miyamoto, MD;  Location: [facility] OR;  Service: General;  Laterality: Left;   right shoulder repair Right    TOTAL HIP ARTHROPLASTY Right 08/29/2021   Procedure: TOTAL HIP ARTHROPLASTY ANTERIOR APPROACH;  Surgeon: Durene Romans, MD;  Location: [facility] ORS;  Service: Orthopedics;  Laterality: Right;   TOTAL HIP ARTHROPLASTY Left 07/25/2023   Procedure: TOTAL HIP ARTHROPLASTY ANTERIOR APPROACH;  Surgeon: Tarry Kos, MD;  Location: [facility] OR;  Service: Orthopedics;  Laterality: Left;  3-C   TOTAL KNEE ARTHROPLASTY Right 11/07/2021   Procedure: TOTAL KNEE ARTHROPLASTY;  Surgeon: Durene Romans, MD;  Location: [facility] ORS;  Service: Orthopedics;  Laterality: Right;   TOTAL KNEE ARTHROPLASTY Left 07/10/2022   Procedure: TOTAL KNEE ARTHROPLASTY;   Surgeon: Durene Romans, MD;  Location: [facility] ORS;  Service: Orthopedics;  Laterality: Left;   WISDOM TOOTH EXTRACTION     Social History   Occupational History   Occupation: disability  Tobacco Use   Smoking status: Never   Smokeless tobacco: Never  Vaping Use   Vaping status: Never Used  Substance and Sexual Activity   Alcohol use: Yes    Alcohol/week: 1.0 standard drink of alcohol    Types: 1 Glasses of wine per week   Drug use: Yes    Types: Marijuana    Comment: 1-2 x per month for help with pain, last 07/07/22   Sexual activity: Not on file

## 2023-08-09 ENCOUNTER — Telehealth: Payer: Self-pay | Admitting: Orthopaedic Surgery

## 2023-08-09 NOTE — Telephone Encounter (Signed)
Almira from [facility] called requesting verbal orders to extend hhpt 2w1 1w1 2w1 1w2 CB# [phone] okay to LVM

## 2023-08-09 NOTE — Telephone Encounter (Signed)
Verbal order given  

## 2023-08-13 ENCOUNTER — Telehealth: Payer: Self-pay

## 2023-08-13 NOTE — Telephone Encounter (Signed)
Patient aware of the below message from [facility]

## 2023-08-13 NOTE — Telephone Encounter (Signed)
Patient wants to know if she can stat using her Lidocaine patches again?

## 2023-08-13 NOTE — Telephone Encounter (Signed)
Not on the surgical site but elsewhere is fine

## 2023-08-26 ENCOUNTER — Ambulatory Visit
Admission: RE | Admit: 2023-08-26 | Discharge: 2023-08-26 | Disposition: A | Discharge status: Home / Self Care | Payer: 59 | Source: Ambulatory Visit | Attending: Family Medicine | Admitting: Family Medicine

## 2023-08-26 DIAGNOSIS — Z0000 Encounter for general adult medical examination without abnormal findings: Secondary | ICD-10-CM

## 2023-09-10 ENCOUNTER — Other Ambulatory Visit (INDEPENDENT_AMBULATORY_CARE_PROVIDER_SITE_OTHER): Payer: 59

## 2023-09-10 ENCOUNTER — Ambulatory Visit: Payer: 59 | Admitting: Physician Assistant

## 2023-09-10 ENCOUNTER — Encounter: Payer: Self-pay | Admitting: Physician Assistant

## 2023-09-10 DIAGNOSIS — Z96642 Presence of left artificial hip joint: Secondary | ICD-10-CM

## 2023-09-10 NOTE — Progress Notes (Signed)
Post-Op Visit Note   Patient: Christine Bradley           Date of Birth: [birth year]           MRN: [id number] Visit Date: 09/10/2023 PCP: Macy Mis, MD   Assessment & Plan:  Chief Complaint:  Chief Complaint  Patient presents with   Left Hip - Follow-up    Left total hip arthroplasty 07/25/2023   Visit Diagnoses:  1. Status post total replacement of left hip     Plan: Patient is a pleasant 57 year old female who comes in today 6 weeks status post left total hip replacement 07/25/2023.  She has been doing great.  Occasional minimal discomfort which is relieved with Tylenol during the day and Lyrica at night.  She is chronically on Eliquis.  Examination of her left hip reveals a fully healed surgical scar without complication.  Painless hip flexion.  She has mild discomfort with logroll.  She is neurovascularly intact distally.  At this point, she will continue to advance with activity as tolerated.  Dental prophylaxis reinforced.  Follow-up in 6 weeks for repeat evaluation.  Call with concerns or questions.  Follow-Up Instructions: Return in about 6 weeks (around 10/22/2023).   Orders:  Orders Placed This Encounter  Procedures   XR Pelvis 1-2 Views   No orders of the defined types were placed in this encounter.   Imaging: XR Pelvis 1-2 Views Result Date: 09/10/2023 Well-seated prosthesis without complication   PMFS History: Patient Active Problem List   Diagnosis Date Noted   Primary osteoarthritis of left hip 07/25/2023   Status post total replacement of left hip 07/25/2023   S/P total knee arthroplasty, left 07/10/2022   Microcytic anemia 02/20/2022   Acute deep vein thrombosis (DVT) of distal vein of right lower extremity (HCC) 12/08/2021   S/P total knee arthroplasty, right 11/07/2021   S/P total right hip arthroplasty 08/29/2021   Past Medical History:  Diagnosis Date   Anxiety    Arthritis    Carpal tunnel syndrome    Chronic bronchitis (HCC)     Chronic kidney disease    COPD (chronic obstructive pulmonary disease) (HCC)    Fibromyalgia    GERD (gastroesophageal reflux disease)    Headache    Chronic migraines   Hypertension    Hyperthyroidism    IBS (irritable bowel syndrome)    IC (interstitial cystitis)    Neuromuscular disorder (HCC)    fibromyalgia   Pneumonia 2001   hx of x 2   Pre-diabetes    Psoriatic arthritis (HCC)    Sleep apnea     Family History  Problem Relation Age of Onset   Arthritis Mother    Breast cancer Mother 72   Breast cancer Half-Sister     Past Surgical History:  Procedure Laterality Date   ABDOMINAL HYSTERECTOMY  2004   BREAST EXCISIONAL BIOPSY Left 12/31/2017   foot surgery  1997   HERNIA REPAIR  1996   RADIOACTIVE SEED GUIDED EXCISIONAL BREAST BIOPSY Left 12/31/2017   Procedure: LEFT BREAST SEED GUIDED EXCISIONAL BIOPSY;  Surgeon: Abigail Miyamoto, MD;  Location: [facility] OR;  Service: General;  Laterality: Left;   right shoulder repair Right    TOTAL HIP ARTHROPLASTY Right 08/29/2021   Procedure: TOTAL HIP ARTHROPLASTY ANTERIOR APPROACH;  Surgeon: Durene Romans, MD;  Location: [facility];  Service: Orthopedics;  Laterality: Right;   TOTAL HIP ARTHROPLASTY Left 07/25/2023   Procedure: TOTAL HIP ARTHROPLASTY ANTERIOR APPROACH;  Surgeon: Gershon Mussel  M, MD;  Location: [facility] OR;  Service: Orthopedics;  Laterality: Left;  3-C   TOTAL KNEE ARTHROPLASTY Right 11/07/2021   Procedure: TOTAL KNEE ARTHROPLASTY;  Surgeon: Durene Romans, MD;  Location: [facility];  Service: Orthopedics;  Laterality: Right;   TOTAL KNEE ARTHROPLASTY Left 07/10/2022   Procedure: TOTAL KNEE ARTHROPLASTY;  Surgeon: Durene Romans, MD;  Location: [facility];  Service: Orthopedics;  Laterality: Left;   WISDOM TOOTH EXTRACTION     Social History   Occupational History   Occupation: disability  Tobacco Use   Smoking status: Never   Smokeless tobacco: Never  Vaping Use   Vaping status: Never Used  Substance and Sexual Activity    Alcohol use: Yes    Alcohol/week: 1.0 standard drink of alcohol    Types: 1 Glasses of wine per week   Drug use: Yes    Types: Marijuana    Comment: 1-2 x per month for help with pain, last 07/07/22   Sexual activity: Not on file

## 2023-09-12 ENCOUNTER — Encounter: Payer: Self-pay | Admitting: Orthopaedic Surgery

## 2023-10-20 NOTE — Progress Notes (Unsigned)
Post-Op Visit Note   Patient: Christine Bradley           Date of Birth: September 03, 1966           MRN: [id number] Visit Date: 10/22/2023 PCP: Macy Mis, MD   Assessment & Plan:  Chief Complaint: No chief complaint on file.  Visit Diagnoses:  1. Status post total replacement of left hip     Plan: ***  Follow-Up Instructions: No follow-ups on file.   Orders:  No orders of the defined types were placed in this encounter.  No orders of the defined types were placed in this encounter.   Imaging: No results found.  PMFS History: Patient Active Problem List   Diagnosis Date Noted   Primary osteoarthritis of left hip 07/25/2023   Status post total replacement of left hip 07/25/2023   S/P total knee arthroplasty, left 07/10/2022   Microcytic anemia 02/20/2022   Acute deep vein thrombosis (DVT) of distal vein of right lower extremity (HCC) 12/08/2021   S/P total knee arthroplasty, right 11/07/2021   S/P total right hip arthroplasty 08/29/2021   Past Medical History:  Diagnosis Date   Anxiety    Arthritis    Carpal tunnel syndrome    Chronic bronchitis (HCC)    Chronic kidney disease    COPD (chronic obstructive pulmonary disease) (HCC)    Fibromyalgia    GERD (gastroesophageal reflux disease)    Headache    Chronic migraines   Hypertension    Hyperthyroidism    IBS (irritable bowel syndrome)    IC (interstitial cystitis)    Neuromuscular disorder (HCC)    fibromyalgia   Pneumonia 2001   hx of x 2   Pre-diabetes    Psoriatic arthritis (HCC)    Sleep apnea     Family History  Problem Relation Age of Onset   Arthritis Mother    Breast cancer Mother 63   Breast cancer Half-Sister     Past Surgical History:  Procedure Laterality Date   ABDOMINAL HYSTERECTOMY  2004   BREAST EXCISIONAL BIOPSY Left 12/31/2017   foot surgery  1997   HERNIA REPAIR  1996   RADIOACTIVE SEED GUIDED EXCISIONAL BREAST BIOPSY Left 12/31/2017   Procedure: LEFT BREAST SEED GUIDED  EXCISIONAL BIOPSY;  Surgeon: Abigail Miyamoto, MD;  Location: [facility] OR;  Service: General;  Laterality: Left;   right shoulder repair Right    TOTAL HIP ARTHROPLASTY Right 08/29/2021   Procedure: TOTAL HIP ARTHROPLASTY ANTERIOR APPROACH;  Surgeon: Durene Romans, MD;  Location: [facility];  Service: Orthopedics;  Laterality: Right;   TOTAL HIP ARTHROPLASTY Left 07/25/2023   Procedure: TOTAL HIP ARTHROPLASTY ANTERIOR APPROACH;  Surgeon: Tarry Kos, MD;  Location: [facility] OR;  Service: Orthopedics;  Laterality: Left;  3-C   TOTAL KNEE ARTHROPLASTY Right 11/07/2021   Procedure: TOTAL KNEE ARTHROPLASTY;  Surgeon: Durene Romans, MD;  Location: [facility];  Service: Orthopedics;  Laterality: Right;   TOTAL KNEE ARTHROPLASTY Left 07/10/2022   Procedure: TOTAL KNEE ARTHROPLASTY;  Surgeon: Durene Romans, MD;  Location: [facility];  Service: Orthopedics;  Laterality: Left;   WISDOM TOOTH EXTRACTION     Social History   Occupational History   Occupation: disability  Tobacco Use   Smoking status: Never   Smokeless tobacco: Never  Vaping Use   Vaping status: Never Used  Substance and Sexual Activity   Alcohol use: Yes    Alcohol/week: 1.0 standard drink of alcohol    Types: 1 Glasses of wine per  week   Drug use: Yes    Types: Marijuana    Comment: 1-2 x per month for help with pain, last 07/07/22   Sexual activity: Not on file

## 2023-10-22 ENCOUNTER — Ambulatory Visit (INDEPENDENT_AMBULATORY_CARE_PROVIDER_SITE_OTHER): Payer: 59 | Admitting: Orthopaedic Surgery

## 2023-10-22 ENCOUNTER — Encounter: Payer: Self-pay | Admitting: Orthopaedic Surgery

## 2023-10-22 DIAGNOSIS — Z96642 Presence of left artificial hip joint: Secondary | ICD-10-CM

## 2023-10-22 MED ORDER — DOXYCYCLINE HYCLATE 100 MG PO TABS: 100.0000 mg | ORAL_TABLET | Freq: Once | ORAL | 4 refills | Status: AC

## 2024-01-20 NOTE — Progress Notes (Unsigned)
 Post-Op Visit Note   Patient: Christine Bradley           Date of Birth: 13-Apr-1966           MRN: [id number] Visit Date: 01/21/2024 PCP: Claudell Cruz, MD   Assessment & Plan:  Chief Complaint: No chief complaint on file.  Visit Diagnoses:  1. Status post total replacement of left hip     Plan: ***  Follow-Up Instructions: No follow-ups on file.   Orders:  No orders of the defined types were placed in this encounter.  No orders of the defined types were placed in this encounter.   Imaging: No results found.  PMFS History: Patient Active Problem List   Diagnosis Date Noted   Primary osteoarthritis of left hip 07/25/2023   Status post total replacement of left hip 07/25/2023   S/P total knee arthroplasty, left 07/10/2022   Microcytic anemia 02/20/2022   Acute deep vein thrombosis (DVT) of distal vein of right lower extremity (HCC) 12/08/2021   S/P total knee arthroplasty, right 11/07/2021   S/P total right hip arthroplasty 08/29/2021   Past Medical History:  Diagnosis Date   Anxiety    Arthritis    Carpal tunnel syndrome    Chronic bronchitis (HCC)    Chronic kidney disease    COPD (chronic obstructive pulmonary disease) (HCC)    Fibromyalgia    GERD (gastroesophageal reflux disease)    Headache    Chronic migraines   Hypertension    Hyperthyroidism    IBS (irritable bowel syndrome)    IC (interstitial cystitis)    Neuromuscular disorder (HCC)    fibromyalgia   Pneumonia 2001   hx of x 2   Pre-diabetes    Psoriatic arthritis (HCC)    Sleep apnea     Family History  Problem Relation Age of Onset   Arthritis Mother    Breast cancer Mother 43   Breast cancer Half-Sister     Past Surgical History:  Procedure Laterality Date   ABDOMINAL HYSTERECTOMY  2004   BREAST EXCISIONAL BIOPSY Left 12/31/2017   foot surgery  1997   HERNIA REPAIR  1996   RADIOACTIVE SEED GUIDED EXCISIONAL BREAST BIOPSY Left 12/31/2017   Procedure: LEFT BREAST SEED GUIDED  EXCISIONAL BIOPSY;  Surgeon: Oza Blumenthal, MD;  Location: [facility] OR;  Service: General;  Laterality: Left;   right shoulder repair Right    TOTAL HIP ARTHROPLASTY Right 08/29/2021   Procedure: TOTAL HIP ARTHROPLASTY ANTERIOR APPROACH;  Surgeon: Claiborne Crew, MD;  Location: [facility];  Service: Orthopedics;  Laterality: Right;   TOTAL HIP ARTHROPLASTY Left 07/25/2023   Procedure: TOTAL HIP ARTHROPLASTY ANTERIOR APPROACH;  Surgeon: Wes Hamman, MD;  Location: [facility] OR;  Service: Orthopedics;  Laterality: Left;  3-C   TOTAL KNEE ARTHROPLASTY Right 11/07/2021   Procedure: TOTAL KNEE ARTHROPLASTY;  Surgeon: Claiborne Crew, MD;  Location: [facility];  Service: Orthopedics;  Laterality: Right;   TOTAL KNEE ARTHROPLASTY Left 07/10/2022   Procedure: TOTAL KNEE ARTHROPLASTY;  Surgeon: Claiborne Crew, MD;  Location: [facility];  Service: Orthopedics;  Laterality: Left;   WISDOM TOOTH EXTRACTION     Social History   Occupational History   Occupation: disability  Tobacco Use   Smoking status: Never   Smokeless tobacco: Never  Vaping Use   Vaping status: Never Used  Substance and Sexual Activity   Alcohol  use: Yes    Alcohol /week: 1.0 standard drink of alcohol     Types: 1 Glasses of wine per  week   Drug use: Yes    Types: Marijuana    Comment: 1-2 x per month for help with pain, last 07/07/22   Sexual activity: Not on file

## 2024-01-21 ENCOUNTER — Ambulatory Visit (INDEPENDENT_AMBULATORY_CARE_PROVIDER_SITE_OTHER): Payer: 59 | Admitting: Orthopaedic Surgery

## 2024-01-21 ENCOUNTER — Encounter: Payer: Self-pay | Admitting: Orthopaedic Surgery

## 2024-01-21 ENCOUNTER — Other Ambulatory Visit (INDEPENDENT_AMBULATORY_CARE_PROVIDER_SITE_OTHER)

## 2024-01-21 DIAGNOSIS — Z96642 Presence of left artificial hip joint: Secondary | ICD-10-CM

## 2024-01-23 ENCOUNTER — Telehealth: Payer: Self-pay | Admitting: Internal Medicine

## 2024-01-23 NOTE — Telephone Encounter (Signed)
 Okay for an appointment with me but the wait can be lengthy, my next available appointment would likely be July or August

## 2024-01-23 NOTE — Telephone Encounter (Signed)
 Good afternoon Dr. Bridgett Camps,    Patient called stating that her PCP Dr. Maximo Spar sent a referral over for her to be seen for issues with GERD. Patient stated Dr. Leamon Pringle gave your name as a recommendation and patient wanted to schedule an appointment with you. Patient does have previous GI history with [facility] in August of 2024. Patient states she is requesting the transfer of care because she wants a doctor who listens to her and knows her need, she feels she is not getting the correct care with [facility]. Patients records are in epic.   Will you please review and advise on scheduling patient?   Thank you.

## 2024-02-13 ENCOUNTER — Encounter: Payer: Self-pay | Admitting: Nurse Practitioner

## 2024-04-07 NOTE — Progress Notes (Unsigned)
 04/07/2024 Christine Bradley [id number] [birth year]   CHIEF COMPLAINT: GERD  HISTORY OF PRESENT ILLNESS: Christine Bradley is a 58 year old female with a past medical history of anxiety, fibromyalgia, hypertension, RLE DVT s/p right knee surgery 10/2021, suspected thalassemia, CKD, asthma, sleep apnea, migraine headaches, psoriatic arthritis and GERD. She presents to our office today as referred by Dr. Luke Manns for further evaluation regarding GERD.   Previously followed by [facility]. On Nexium 40mg  bid and Famotidine  bid.   EGD in 2016 which was normal and biopsies were negative for H. pylori. She was last seen in clinic in 06/2020 for dysphagia and reflux despite pantoprazole . She was also on Linzess  for constipation. EGD was completed in 06/2020 that showed Candida esophagitis and she was dilated. She was giving fluconazole and pantoprazole .   EGD 06/28/2020:  Candida esophagitis  Proximal esophageal stricture dilated to 54 [place] with a Maloney dilator  Normal stomach and duodenum  Continue on pantoprazole   Start on fluconazole for Candida esophagitis  Repeat EGD with dilation as needed   Colonoscopy 04/21/2015: Colonoscopy documented as normal in GI office visit note, Colonoscopy procedure report not available in care everywhere      Latest Ref Rng & Units 01/08/2023    8:40 AM 10/08/2022   11:09 AM 07/12/2022    3:41 AM  CBC  WBC 4.0 - 10.5 K/uL 6.7  6.4  16.3   Hemoglobin 12.0 - 15.0 g/dL 88.3  88.4  89.6   Hematocrit 36.0 - 46.0 % 38.2  36.5  34.7   Platelets 150 - 400 K/uL 287  307  273        Latest Ref Rng & Units 01/08/2023    8:40 AM 10/08/2022   11:09 AM 07/11/2022    3:49 AM  CMP  Glucose 70 - 99 mg/dL 99  97  779   BUN 6 - 20 mg/dL 12  10  11    Creatinine 0.44 - 1.00 mg/dL 9.20  9.27  9.18   Sodium 135 - 145 mmol/L 141  139  139   Potassium 3.5 - 5.1 mmol/L 3.4  3.8  4.2   Chloride 98 - 111 mmol/L 104  106  109   CO2 22 - 32  mmol/L 30  28  22    Calcium  8.9 - 10.3 mg/dL 9.1  9.0  8.4      EGD 08/01/2004 by Dr. Obie: Small hiatal hernia   Past Medical History:  Diagnosis Date   Anxiety    Arthritis    Carpal tunnel syndrome    Chronic bronchitis (HCC)    Chronic kidney disease    COPD (chronic obstructive pulmonary disease) (HCC)    Fibromyalgia    GERD (gastroesophageal reflux disease)    Headache    Chronic migraines   Hypertension    Hyperthyroidism    IBS (irritable bowel syndrome)    IC (interstitial cystitis)    Neuromuscular disorder (HCC)    fibromyalgia   Pneumonia 2001   hx of x 2   Pre-diabetes    Psoriatic arthritis (HCC)    Sleep apnea    Past Surgical History:  Procedure Laterality Date   ABDOMINAL HYSTERECTOMY  2004   BREAST EXCISIONAL BIOPSY Left 12/31/2017   foot surgery  1997   HERNIA REPAIR  1996   RADIOACTIVE SEED GUIDED EXCISIONAL BREAST BIOPSY Left 12/31/2017   Procedure: LEFT BREAST SEED GUIDED EXCISIONAL BIOPSY;  Surgeon: Vernetta Berg, MD;  Location: [facility] OR;  Service: General;  Laterality: Left;   right shoulder repair Right    TOTAL HIP ARTHROPLASTY Right 08/29/2021   Procedure: TOTAL HIP ARTHROPLASTY ANTERIOR APPROACH;  Surgeon: Ernie Cough, MD;  Location: [facility];  Service: Orthopedics;  Laterality: Right;   TOTAL HIP ARTHROPLASTY Left 07/25/2023   Procedure: TOTAL HIP ARTHROPLASTY ANTERIOR APPROACH;  Surgeon: Jerri Kay HERO, MD;  Location: [facility] OR;  Service: Orthopedics;  Laterality: Left;  3-C   TOTAL KNEE ARTHROPLASTY Right 11/07/2021   Procedure: TOTAL KNEE ARTHROPLASTY;  Surgeon: Ernie Cough, MD;  Location: [facility];  Service: Orthopedics;  Laterality: Right;   TOTAL KNEE ARTHROPLASTY Left 07/10/2022   Procedure: TOTAL KNEE ARTHROPLASTY;  Surgeon: Ernie Cough, MD;  Location: [facility];  Service: Orthopedics;  Laterality: Left;   WISDOM TOOTH EXTRACTION     Social History:  reports that she has never smoked. She has never used smokeless tobacco. She  reports current alcohol  use of about 1.0 standard drink of alcohol  per week. She reports current drug use. Drug: Marijuana.  Family History: family history includes Arthritis in her mother; Breast cancer in her half-sister; Breast cancer (age of onset: 91) in her mother.  Allergies  Allergen Reactions   Xarelto [Rivaroxaban] Other (See Comments)     exacerbated her fibromyalgia.   Albuterol  Other (See Comments)    Inhaler only (nebulizer is fine) - reaction: worsened asthma and bronchitis   Hydrocodone -Acetaminophen  Nausea And Vomiting    Can not take Name Brand Vicodin - Generic is Ok    Maxalt [Rizatriptan]     Worsened migraines    Penicillins Other (See Comments)    raw on my bottom Has patient had a PCN reaction causing immediate rash, facial/tongue/throat swelling, SOB or lightheadedness with hypotension: No Has patient had a PCN reaction causing severe rash involving mucus membranes or skin necrosis: No Has patient had a PCN reaction that required hospitalization No Has patient had a PCN reaction occurring within the last 10 years: No If all of the above answers are NO, then may proceed with Cephalosporin use. Tolerated ancef  08/29/21     Voltaren  [Diclofenac  Sodium] Other (See Comments)    Stomach ulcers - in pill form, pt is currently using the topical form      Outpatient Encounter Medications as of 04/08/2024  Medication Sig   amLODipine  (NORVASC ) 2.5 MG tablet Take 2.5 mg by mouth daily.   apixaban  (ELIQUIS ) 2.5 MG TABS tablet Take 1 tablet (2.5 mg total) by mouth every 12 (twelve) hours. To be taken after surgery to prevent blood clots   cetirizine (ZYRTEC) 10 MG tablet Take 10 mg by mouth in the morning.   Cholecalciferol (VITAMIN D) 2000 units tablet Take 4,000 Units by mouth daily.   cyclobenzaprine  (FLEXERIL ) 5 MG tablet Take 1 tablet (5 mg total) by mouth 2 (two) times daily as needed for muscle spasms.   doxycycline  (VIBRA -TABS) 100 MG tablet Take 1 tablet  (100 mg total) by mouth 2 (two) times daily.   esomeprazole (NEXIUM) 40 MG capsule Take 40 mg by mouth in the morning and at bedtime.   famotidine  (PEPCID ) 40 MG tablet Take 40 mg by mouth 2 (two) times daily.   fluticasone (FLONASE) 50 MCG/ACT nasal spray Place 2 sprays into both nostrils daily as needed for allergies.   Fluticasone-Salmeterol (ADVAIR) 250-50 MCG/DOSE AEPB Inhale 1 puff into the lungs 2 (two) times daily.   ipratropium (ATROVENT  HFA) 17 MCG/ACT inhaler Inhale 2 puffs into the lungs  every 6 (six) hours as needed for wheezing.    linaclotide  (LINZESS ) 72 MCG capsule Take 72 mcg by mouth daily before breakfast.   methocarbamol  (ROBAXIN -750) 750 MG tablet Take 1 tablet (750 mg total) by mouth 2 (two) times daily as needed for muscle spasms.   montelukast  (SINGULAIR ) 10 MG tablet Take 10 mg by mouth in the morning.   Multiple Vitamins-Minerals (HAIR/SKIN/NAILS PO) Take 2 tablets by mouth every morning.    Multiple Vitamins-Minerals (MULTIVITAMIN GUMMIES ADULT PO) Take 2 each by mouth every morning.   ondansetron  (ZOFRAN ) 4 MG tablet Take 1 tablet (4 mg total) by mouth every 8 (eight) hours as needed for nausea or vomiting.   oxybutynin  (DITROPAN ) 5 MG tablet Take 5 mg by mouth at bedtime.   oxyCODONE  (ROXICODONE ) 5 MG immediate release tablet Take 1-2 tablets (5-10 mg total) by mouth 3 (three) times daily as needed for severe pain (pain score 7-10).   oxyCODONE -acetaminophen  (PERCOCET) 5-325 MG tablet Take 1-2 tablets by mouth every 6 (six) hours as needed. To be taken after surgery   Polyethyl Glycol-Propyl Glycol 0.4-0.3 % SOLN Place 1 drop into both eyes in the morning.   pregabalin  (LYRICA ) 150 MG capsule Take 150 mg by mouth at bedtime.    venlafaxine  XR (EFFEXOR -XR) 75 MG 24 hr capsule Take 75 mg by mouth daily.   vitamin C (ASCORBIC ACID) 250 MG tablet Take 500 mg by mouth daily.   No facility-administered encounter medications on file as of 04/08/2024.     REVIEW OF  SYSTEMS:  Gen: Denies fever, sweats or chills. No weight loss.  CV: Denies chest pain, palpitations or edema. Resp: Denies cough, shortness of breath of hemoptysis.  GI: Denies heartburn, dysphagia, stomach or lower abdominal pain. No diarrhea or constipation.  GU: Denies urinary burning, blood in urine, increased urinary frequency or incontinence. MS: Denies joint pain, muscles aches or weakness. Derm: Denies rash, itchiness, skin lesions or unhealing ulcers. Psych: Denies depression, anxiety, memory loss or confusion. Heme: Denies bruising, easy bleeding. Neuro:  Denies headaches, dizziness or paresthesias. Endo:  Denies any problems with DM, thyroid  or adrenal function.  PHYSICAL EXAM: There were no vitals taken for this visit. General: in no acute distress. Head: Normocephalic and atraumatic. Eyes:  Sclerae non-icteric, conjunctive pink. Ears: Normal auditory acuity. Mouth: Dentition intact. No ulcers or lesions.  Neck: Supple, no lymphadenopathy or thyromegaly.  Lungs: Clear bilaterally to auscultation without wheezes, crackles or rhonchi. Heart: Regular rate and rhythm. No murmur, rub or gallop appreciated.  Abdomen: Soft, nontender, nondistended. No masses. No hepatosplenomegaly. Normoactive bowel sounds x 4 quadrants.  Rectal: Deferred.  Musculoskeletal: Symmetrical with no gross deformities. Skin: Warm and dry. No rash or lesions on visible extremities. Extremities: No edema. Neurological: Alert oriented x 4, no focal deficits.  Psychological: Alert and cooperative. Normal mood and affect.  ASSESSMENT AND PLAN:    CC:  Rena Christine POUR, MD

## 2024-04-08 ENCOUNTER — Other Ambulatory Visit: Payer: Self-pay | Admitting: Physician Assistant

## 2024-04-08 ENCOUNTER — Ambulatory Visit (INDEPENDENT_AMBULATORY_CARE_PROVIDER_SITE_OTHER): Admitting: Nurse Practitioner

## 2024-04-08 ENCOUNTER — Encounter: Payer: Self-pay | Admitting: Nurse Practitioner

## 2024-04-08 VITALS — BP 122/82 | HR 87 | Ht 65.0 in | Wt 209.0 lb

## 2024-04-08 DIAGNOSIS — K219 Gastro-esophageal reflux disease without esophagitis: Secondary | ICD-10-CM | POA: Diagnosis not present

## 2024-04-08 DIAGNOSIS — K5909 Other constipation: Secondary | ICD-10-CM | POA: Diagnosis not present

## 2024-04-08 NOTE — Progress Notes (Signed)
 Agree with assessment and plan as outlined. Colleen okay to do diagnostic EGD on Eliquis  for GERD, does not need to hold it if no plans for dilation.

## 2024-04-08 NOTE — Progress Notes (Signed)
 Clearance letter faxed to PCP at [facility]. Dr.Sebastian Rena, MD

## 2024-04-08 NOTE — Patient Instructions (Signed)
 Stop Esopmeprazole.  Start Omeprazole 40 mg twice daily 30 minutes before breakfast and dinner.  Continue Famotidine  twice daily.  You have been scheduled for an endoscopy. Please follow written instructions given to you at your visit today.  If you use inhalers (even only as needed), please bring them with you on the day of your procedure.  If you take any of the following medications, they will need to be adjusted prior to your procedure:   DO NOT TAKE 7 DAYS PRIOR TO TEST- Trulicity (dulaglutide) Ozempic, Wegovy (semaglutide) Mounjaro (tirzepatide) Bydureon Bcise (exanatide extended release)  DO NOT TAKE 1 DAY PRIOR TO YOUR TEST Rybelsus (semaglutide) Adlyxin (lixisenatide) Victoza (liraglutide) Byetta (exanatide) ___________________________________________________________________________   _______________________________________________________  If your blood pressure at your visit was 140/90 or greater, please contact your primary care physician to follow up on this.  _______________________________________________________  If you are age 66 or older, your body mass index should be between 23-30. Your Body mass index is 34.78 kg/m. If this is out of the aforementioned range listed, please consider follow up with your Primary Care Provider.  If you are age 13 or younger, your body mass index should be between 19-25. Your Body mass index is 34.78 kg/m. If this is out of the aformentioned range listed, please consider follow up with your Primary Care Provider.   ________________________________________________________  The [facility] providers would like to encourage you to use MYCHART to communicate with providers for non-urgent requests or questions.  Due to long hold times on the telephone, sending your provider a message by [facility] may be a faster and more efficient way to get a response.  Please allow 48 business hours for a response.  Please remember that this is for  non-urgent requests.  _______________________________________________________   Thank you for trusting me with your gastrointestinal care!   Elida Shawl, CRNP

## 2024-04-09 ENCOUNTER — Telehealth: Payer: Self-pay

## 2024-04-09 NOTE — Telephone Encounter (Signed)
 Error

## 2024-04-09 NOTE — Progress Notes (Signed)
 Maya, pls contact patient and let her know that Dr. Leigh reviewed her office appointment and verified that she does not need to hold Eliquis  for a diagnostic EGD (assessing for GERD). THX.

## 2024-04-09 NOTE — Telephone Encounter (Signed)
 Contacted patient and informed her she would not have to hold Eliquis  prior to  her procedure per Dr.Armbruster.

## 2024-04-10 ENCOUNTER — Encounter: Payer: Self-pay | Admitting: Advanced Practice Midwife

## 2024-05-08 ENCOUNTER — Encounter: Payer: Self-pay | Admitting: Gastroenterology

## 2024-05-08 ENCOUNTER — Ambulatory Visit (AMBULATORY_SURGERY_CENTER): Admitting: Gastroenterology

## 2024-05-08 VITALS — BP 141/96 | HR 87 | Temp 97.3°F | Resp 11 | Ht 65.0 in | Wt 209.0 lb

## 2024-05-08 DIAGNOSIS — K219 Gastro-esophageal reflux disease without esophagitis: Secondary | ICD-10-CM

## 2024-05-08 DIAGNOSIS — K2100 Gastro-esophageal reflux disease with esophagitis, without bleeding: Secondary | ICD-10-CM

## 2024-05-08 DIAGNOSIS — K449 Diaphragmatic hernia without obstruction or gangrene: Secondary | ICD-10-CM | POA: Diagnosis not present

## 2024-05-08 DIAGNOSIS — R1013 Epigastric pain: Secondary | ICD-10-CM

## 2024-05-08 MED ORDER — SUCRALFATE 1 GM/10ML PO SUSP: 1.0000 g | Freq: Three times a day (TID) | ORAL | 1 refills | Status: AC | PRN

## 2024-05-08 MED ORDER — SODIUM CHLORIDE 0.9 % IV SOLN: 500.0000 mL | Freq: Once | INTRAVENOUS | Status: DC

## 2024-05-08 NOTE — Op Note (Signed)
 [facility] Patient Name: Christine Bradley Procedure Date: 05/08/2024 7:34 AM MRN: [id number] Endoscopist: Elspeth P. Leigh , MD, [id number] Age: 58 Referring MD:  Date of Birth: 03-27-1966 Gender: Female Account #: [id number] Procedure:                Upper GI endoscopy Indications:              Follow-up of gastro-esophageal reflux disease -                            persistent postprandial reflux and epigastric                            burning despite high dose PPI (failed protonix ,                            nexium, currently on omeprazole twice daily with                            persistent symptoms) Medicines:                Monitored Anesthesia Care Procedure:                Pre-Anesthesia Assessment:                           - Prior to the procedure, a History and Physical                            was performed, and patient medications and                            allergies were reviewed. The patient's tolerance of                            previous anesthesia was also reviewed. The risks                            and benefits of the procedure and the sedation                            options and risks were discussed with the patient.                            All questions were answered, and informed consent                            was obtained. Prior Anticoagulants: The patient has                            taken Eliquis  (apixaban ), last dose was day of                            procedure. ASA Grade Assessment: III - A patient  with severe systemic disease. After reviewing the                            risks and benefits, the patient was deemed in                            satisfactory condition to undergo the procedure.                           After obtaining informed consent, the endoscope was                            passed under direct vision. Throughout the                            procedure, the patient's  blood pressure, pulse, and                            oxygen saturations were monitored continuously. The                            Olympus Scope [id number] was introduced through the                            mouth, and advanced to the second part of duodenum.                            The upper GI endoscopy was accomplished without                            difficulty. The patient tolerated the procedure                            well. Scope In: Scope Out: Findings:                 Esophagogastric landmarks were identified: the                            Z-line was found at 35 cm, the gastroesophageal                            junction was found at 35 cm and the upper extent of                            the gastric folds was found at 36 cm from the                            incisors.                           A 1 cm hiatal hernia was present.                           The gastroesophageal flap  valve was visualized                            endoscopically and classified as Hill Grade IV (no                            fold, wide open lumen, hiatal hernia present).                            Significant laxity at the GEJ                           LA Grade A esophagitis with no bleeding was found                            35 cm from the incisors.                           The exam of the esophagus was otherwise normal.                           Biopsies were taken with a cold forceps for                            histology to rule out EoE.                           The entire examined stomach was normal. Biopsies                            were taken with a cold forceps for Helicobacter                            pylori testing.                           The examined duodenum was normal. Complications:            No immediate complications. Estimated blood loss:                            Minimal. Estimated Blood Loss:     Estimated blood loss was minimal. Impression:                - Esophagogastric landmarks identified.                           - 1 cm hiatal hernia.                           - Gastroesophageal flap valve classified as Hill                            Grade IV (no fold, wide open lumen, hiatal hernia  present).                           - LA Grade A reflux esophagitis with no bleeding.                           - Normal esophagus otherwise - biopsies taken to                            rule out EoE                           - Normal stomach. Biopsied.                           - Normal examined duodenum. Recommendation:           - Patient has a contact number available for                            emergencies. The signs and symptoms of potential                            delayed complications were discussed with the                            patient. Return to normal activities tomorrow.                            Written discharge instructions were provided to the                            patient.                           - Resume previous diet.                           - Continue present medications including Eliquis                            - Will see if we can get samples of Voquezna 20mg  /                            day for a few weeks and see if that helps (stop                            omeprazole during this time). If this works better                            would use Voquezna for long term Insurance account manager. If                            failing all medications, then will need to consider  hiatal hernia repair / Nissen if symptoms persist /                            refractory                           - Add liquid carafate  10cc po every 8 hours PRN for                            breakthrough symptoms                           - Await pathology results. Elspeth P. Cecilia Vancleve, MD 05/08/2024 8:11:17 AM This report has been signed electronically.

## 2024-05-08 NOTE — Progress Notes (Signed)
 0748 Robinul 0.1 mg IV given due large amount of secretions upon assessment.  MD made aware, vss

## 2024-05-08 NOTE — Progress Notes (Signed)
 Data will not transfer from monitor, vs being posted manually.

## 2024-05-08 NOTE — Progress Notes (Signed)
 History and Physical Interval Note: Persistent reflux symptoms, burning in the chest with eating / swallows, no dysphagia. Epigastric burning as well. On omeprazole BID and pepcid , not much relief. EGD to further evaluate. NO interval changes since office visit. Exam done ON eliquis  to further evaluate. She wishes to proceed.   05/08/2024 7:55 AM  Christine Bradley  has presented today for endoscopic procedure(s), with the diagnosis of  Encounter Diagnosis  Name Primary?   Gastroesophageal reflux disease, unspecified whether esophagitis present Yes  .  The various methods of evaluation and treatment have been discussed with the patient and/or family. After consideration of risks, benefits and other options for treatment, the patient has consented to  the endoscopic procedure(s).   The patient's history has been reviewed, patient examined, no change in status, stable for surgery.  I have reviewed the patient's chart and labs.  Questions were answered to the patient's satisfaction.    Marcey Naval, MD [facility]

## 2024-05-08 NOTE — Patient Instructions (Addendum)
 Ok to restart Eliquis .  Samples given of Voquezna (stop omeprazole while taking this medication).  Prescription for carafate  sent to pharmacy on file.  YOU HAD AN ENDOSCOPIC PROCEDURE TODAY AT THE [facility]:   Refer to the procedure report that was given to you for any specific questions about what was found during the examination.  If the procedure report does not answer your questions, please call your gastroenterologist to clarify.  If you requested that your care partner not be given the details of your procedure findings, then the procedure report has been included in a sealed envelope for you to review at your convenience later.  YOU SHOULD EXPECT: Some feelings of bloating in the abdomen. Passage of more gas than usual.  Walking can help get rid of the air that was put into your GI tract during the procedure and reduce the bloating. If you had a lower endoscopy (such as a colonoscopy or flexible sigmoidoscopy) you may notice spotting of blood in your stool or on the toilet paper. If you underwent a bowel prep for your procedure, you may not have a normal bowel movement for a few days.  Please Note:  You might notice some irritation and congestion in your nose or some drainage.  This is from the oxygen used during your procedure.  There is no need for concern and it should clear up in a day or so.  SYMPTOMS TO REPORT IMMEDIATELY:  Following upper endoscopy (EGD)  Vomiting of blood or coffee ground material  New chest pain or pain under the shoulder blades  Painful or persistently difficult swallowing  New shortness of breath  Fever of 100F or higher  Black, tarry-looking stools  For urgent or emergent issues, a gastroenterologist can be reached at any hour by calling [phone]. Do not use MyChart messaging for urgent concerns.    DIET:  We do recommend a small meal at first, but then you may proceed to your regular diet.  Drink plenty of fluids but you should avoid  alcoholic beverages for 24 hours.  ACTIVITY:  You should plan to take it easy for the rest of today and you should NOT DRIVE or use heavy machinery until tomorrow (because of the sedation medicines used during the test).    FOLLOW UP: Our staff will call the number listed on your records the next business day following your procedure.  We will call around 7:15- 8:00 am to check on you and address any questions or concerns that you may have regarding the information given to you following your procedure. If we do not reach you, we will leave a message.     If any biopsies were taken you will be contacted by phone or by letter within the next 1-3 weeks.  Please call us  at [phone] if you have not heard about the biopsies in 3 weeks.    SIGNATURES/CONFIDENTIALITY: You and/or your care partner have signed paperwork which will be entered into your electronic medical record.  These signatures attest to the fact that that the information above on your After Visit Summary has been reviewed and is understood.  Full responsibility of the confidentiality of this discharge information lies with you and/or your care-partner.

## 2024-05-08 NOTE — Progress Notes (Signed)
 Pt's states no medical or surgical changes since previsit or office visit.

## 2024-05-08 NOTE — Progress Notes (Signed)
 Called to room to assist during endoscopic procedure.  Patient ID and intended procedure confirmed with present staff. Received instructions for my participation in the procedure from the performing physician.

## 2024-05-08 NOTE — Progress Notes (Signed)
 Report given to PACU, vss

## 2024-05-11 ENCOUNTER — Telehealth: Payer: Self-pay | Admitting: *Deleted

## 2024-05-11 NOTE — Telephone Encounter (Signed)
 Post procedure follow up call placed, no answer and left VM.

## 2024-05-12 LAB — SURGICAL PATHOLOGY

## 2024-05-13 ENCOUNTER — Ambulatory Visit: Payer: Self-pay | Admitting: Gastroenterology

## 2024-05-19 ENCOUNTER — Other Ambulatory Visit: Payer: Self-pay

## 2024-05-19 MED ORDER — VOQUEZNA 10 MG PO TABS: 10.0000 mg | ORAL_TABLET | Freq: Every day | ORAL | 3 refills | Status: DC

## 2024-05-21 ENCOUNTER — Other Ambulatory Visit: Payer: Self-pay | Admitting: Gastroenterology

## 2024-05-21 ENCOUNTER — Other Ambulatory Visit (HOSPITAL_COMMUNITY): Payer: Self-pay

## 2024-05-21 ENCOUNTER — Telehealth: Payer: Self-pay

## 2024-05-21 NOTE — Telephone Encounter (Signed)
 Pharmacy Patient Advocate Encounter   Received notification from RX Request Messages that prior authorization for Voquezna  10MG  tablets is required/requested.   Insurance verification completed.   The patient is insured through [facility] .   Per test claim: PA required; PA submitted to above mentioned insurance via Latent Key/confirmation #/EOC [id number] Status is pending

## 2024-05-21 NOTE — Telephone Encounter (Signed)
 PA request has been Submitted. New Encounter has been or will be created for follow up. For additional info see Pharmacy Prior Auth telephone encounter from 05-21-2024.

## 2024-05-22 NOTE — Telephone Encounter (Signed)
 Pharmacy Patient Advocate Encounter  Received notification from [facility] Medicare that Prior Authorization for Voquezna  10MG  tablets has been DENIED.  Full denial letter will be uploaded to the media tab. See denial reason below.  Medication authorization requires the following: (1) You need to try two (2) of these covered drugs:  (A) Dexlansoprazole (B) Lansoprazole (C) Rabeprazole (2) OR Your doctor needs to give us  specific medical reasons why two (2) of the covered drug(s) are not appropriate for you  PA #/Case ID/Reference #: [id number]

## 2024-05-23 NOTE — Telephone Encounter (Signed)
 Sorry to hear this. So far she has failed protonix , nexium, and omeprazole. Can you let her know her insurance is not covering Voquezna  but looks like it may cover Dexilant . If that is the case let's try Dexilant  60mg  once daily for a 30 day trial and see how she does. If she fails this we can try appealing again. Thanks

## 2024-05-26 MED ORDER — DEXLANSOPRAZOLE 60 MG PO CPDR: 60.0000 mg | DELAYED_RELEASE_CAPSULE | Freq: Every day | ORAL | 0 refills | Status: DC

## 2024-05-26 NOTE — Telephone Encounter (Signed)
 I have spoken to patient to advise her of insurance requirements before they will consider coverage of Voquezna . Patient will try a 30 day supply of dexilant  and will let us  know if this is ineffective.  Dexilant  rx sent to patient pharmacy.

## 2024-06-17 ENCOUNTER — Encounter: Payer: Self-pay | Admitting: Internal Medicine

## 2024-06-17 ENCOUNTER — Other Ambulatory Visit: Payer: Self-pay | Admitting: Gastroenterology

## 2024-06-17 ENCOUNTER — Ambulatory Visit: Attending: Internal Medicine | Admitting: Internal Medicine

## 2024-06-17 VITALS — BP 121/78 | HR 80 | Temp 97.4°F | Resp 16 | Ht 65.0 in | Wt 210.2 lb

## 2024-06-17 DIAGNOSIS — I739 Peripheral vascular disease, unspecified: Secondary | ICD-10-CM | POA: Insufficient documentation

## 2024-06-17 DIAGNOSIS — L4050 Arthropathic psoriasis, unspecified: Secondary | ICD-10-CM | POA: Diagnosis not present

## 2024-06-17 DIAGNOSIS — Z79899 Other long term (current) drug therapy: Secondary | ICD-10-CM

## 2024-06-17 DIAGNOSIS — I824Z1 Acute embolism and thrombosis of unspecified deep veins of right distal lower extremity: Secondary | ICD-10-CM

## 2024-06-17 DIAGNOSIS — D509 Iron deficiency anemia, unspecified: Secondary | ICD-10-CM | POA: Diagnosis not present

## 2024-06-17 DIAGNOSIS — M797 Fibromyalgia: Secondary | ICD-10-CM

## 2024-06-17 DIAGNOSIS — M19079 Primary osteoarthritis, unspecified ankle and foot: Secondary | ICD-10-CM | POA: Insufficient documentation

## 2024-06-17 DIAGNOSIS — M21862 Other specified acquired deformities of left lower leg: Secondary | ICD-10-CM | POA: Insufficient documentation

## 2024-06-17 MED ORDER — CYCLOBENZAPRINE HCL 10 MG PO TABS: 10.0000 mg | ORAL_TABLET | Freq: Every evening | ORAL | 0 refills | Status: AC | PRN

## 2024-06-17 NOTE — Progress Notes (Signed)
 Office Visit Note  Patient: Christine Bradley             Date of Birth: 1966-03-16           MRN: [id number]             PCP: Rena Luke POUR, MD Referring: Rena Luke POUR, MD Visit Date: 06/17/2024 Occupation: Data Unavailable  Subjective:  New Patient (Initial Visit), Fibromyalgia, and Arthritis (Sciatica arthritis , previous rheumatologist retired.  )    Discussed the use of AI scribe software for clinical note transcription with the patient, who gave verbal consent to proceed.  History of Present Illness   Christine Bradley is a 58 year old female with psoriatic arthritis and plaque psoriasis who presents for management of her condition.  She has a history of psoriatic arthritis and plaque psoriasis, previously managed at [facility]. She is currently taking Rinvoq 15 mg daily, prescribed by her dermatologist, and has been on it for a couple of months. Her psoriasis initially presented with skin rashes on her scalp, ears, legs, and backside, which were painful and raw. She has tried multiple treatments including Humira, Otezla, and Cosentyx, which provided temporary relief but eventually lost effectiveness. Her psoriasis flares up with seasonal changes, particularly in summer and fall.  She has a history of osteoarthritis, with both hips and knees replaced due to severe damage. She has experienced an 'arthritic body' since age 64, with initial symptoms in her knees. She has undergone various treatments, including injections, but continues to experience pain and swelling, particularly on her right side. She has been diagnosed with arthritis in her feet and has swelling, particularly on her right side. She experiences pain in her knuckles and hands, which has worsened over the last six months, and also reports shoulder pain.  She has fibromyalgia, diagnosed at age 63, which contributes to her chronic pain. She experiences constipation since childhood, managed with  medication, and has a history of acid reflux. She wakes up twice nightly due to bladder urgency. She takes Flexeril  at night for muscle relaxation and Qulipta for migraines, which have been effective. She experiences short-term memory issues, which are new for her.  She has a history of scoliosis, diagnosed in adulthood, and degenerative disc disease, identified last year through imaging. She uses Voltaren  and Lidoderm  patches for joint pain. She reports numbness in her feet and legs, but not in her hands. She has not had recent imaging of her back and has self-administered arthritis medications such as Humira.  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       DMARD Hx PsA Humira+otezla Cosentyx rinvoq (current)  FMS  Cymbalta Effexor  Lyrica  Flexeril  baclofen  Labs reviewed 06/2023 CBC - MCV 73 CMP wnl Iron wnl   Activities of Daily Living:  Patient reports morning stiffness for 20 minutes.   Patient Reports nocturnal pain.  Difficulty dressing/grooming: Denies Difficulty climbing stairs: Reports Difficulty getting out of chair: Denies Difficulty using hands for taps, buttons, cutlery, and/or writing: Reports  Review of Systems  Constitutional:  Negative for fatigue.  HENT:  Positive for mouth dryness. Negative for mouth sores.   Eyes:  Positive for dryness.  Respiratory:  Negative for shortness of breath.   Cardiovascular:  Negative for chest pain and palpitations.  Gastrointestinal:  Negative for blood in stool, constipation and diarrhea.  Endocrine: Positive for increased urination.  Genitourinary:  Negative for involuntary urination.  Musculoskeletal:  Positive for joint pain, gait problem, joint pain, joint swelling, myalgias, muscle weakness, morning stiffness, muscle tenderness and myalgias.  Skin:  Positive for color change, rash and sensitivity to sunlight. Negative  for hair loss.  Allergic/Immunologic: Positive for susceptible to infections.  Neurological:  Positive for headaches. Negative for dizziness.  Hematological:  Negative for swollen glands.  Psychiatric/Behavioral:  Positive for depressed mood and sleep disturbance. The patient is not nervous/anxious.     PMFS History:  Patient Active Problem List   Diagnosis Date Noted   Peripheral vascular disease 06/17/2024   Other specified acquired deformities of left lower leg 06/17/2024   Primary localized osteoarthrosis of ankle and foot 06/17/2024   High risk medication use 06/17/2024   GERD (gastroesophageal reflux disease) 04/08/2024   Primary osteoarthritis of left hip 07/25/2023   Status post total replacement of left hip 07/25/2023   Candidal intertrigo 10/31/2022   Hypertension 07/30/2022   S/P total knee arthroplasty, left 07/10/2022   Microcytic anemia 02/20/2022   Acute deep vein thrombosis (DVT) of distal vein of right lower extremity (HCC) 12/08/2021   History of DVT (deep vein thrombosis) 11/21/2021   S/P total knee arthroplasty, right 11/07/2021   S/P total right hip arthroplasty 08/29/2021   Hypopigmentation 11/29/2020   Decreased hearing 07/13/2020   Paresthesia 07/13/2020   Prediabetes 07/12/2020   Psoriatic arthritis (HCC) 06/21/2020   Chronic constipation 05/27/2020   Full thickness rotator cuff tear 03/24/2019   Benign paroxysmal positional vertigo 11/19/2018   Carpal tunnel syndrome of left wrist 11/22/2014   Migraine without status migrainosus, not intractable 11/15/2014   Obesity (BMI 30-39.9) 05/03/2014   Osteopenia 05/09/2012   Asthma 03/21/2012   Fibromyalgia 03/21/2012   GAD (generalized anxiety disorder) 03/21/2012   Chronic interstitial cystitis 01/11/2011   Hyperlipidemia 01/11/2011   Allergic rhinitis 11/17/2010    Past Medical History:  Diagnosis Date   Anxiety    Arthritis    Carpal tunnel syndrome    Chronic bronchitis (HCC)    Chronic kidney  disease    patient states she doesn't have any kidney issues.   COPD (chronic obstructive pulmonary disease) (HCC)    DDD (degenerative disc disease), lumbar    Fibromyalgia    GERD (gastroesophageal reflux disease)    H/O blood clots    Headache    Chronic migraines   Hypertension    Hyperthyroidism    IBS (irritable bowel syndrome)    IC (interstitial cystitis)    Neuromuscular disorder (HCC)    fibromyalgia   Pneumonia 2001   hx of x 2   Pre-diabetes    Psoriatic arthritis (HCC)    Sleep apnea    Vertigo 2022    Family History  Problem Relation Age of Onset   Arthritis Mother    Breast cancer Mother 73   Clotting disorder Mother    Cancer Sister    Clotting disorder Brother    Other Brother        kidney transplant at 62 yrs old   Breast cancer Half-Sister    Allergies Son    ADD / ADHD Son    ADD / ADHD Daughter    Bipolar disorder Daughter    Colon cancer Neg Hx    Esophageal cancer Neg Hx    Liver disease Neg Hx    Past Surgical History:  Procedure Laterality Date   ABDOMINAL HYSTERECTOMY  2004   BREAST EXCISIONAL BIOPSY Left 12/31/2017   foot  surgery  1997   HERNIA REPAIR  1996   RADIOACTIVE SEED GUIDED EXCISIONAL BREAST BIOPSY Left 12/31/2017   Procedure: LEFT BREAST SEED GUIDED EXCISIONAL BIOPSY;  Surgeon: Vernetta Berg, MD;  Location[facility] OR;  Service: General;  Laterality: Left;   right shoulder repair Right    TOTAL HIP ARTHROPLASTY Right 08/29/2021   Procedure: TOTAL HIP ARTHROPLASTY ANTERIOR APPROACH;  Surgeon: Ernie Cough, MD;  Location: [facility] ORS;  Service: Orthopedics;  Laterality: Right;   TOTAL HIP ARTHROPLASTY Left 07/25/2023   Procedure: TOTAL HIP ARTHROPLASTY ANTERIOR APPROACH;  Surgeon: Jerri Kay HERO, MD;  Location: [facility] OR;  Service: Orthopedics;  Laterality: Left;  3-C   TOTAL KNEE ARTHROPLASTY Right 11/07/2021   Procedure: TOTAL KNEE ARTHROPLASTY;  Surgeon: Ernie Cough, MD;  Location: [facility] ORS;  Service: Orthopedics;  Laterality: Right;    TOTAL KNEE ARTHROPLASTY Left 07/10/2022   Procedure: TOTAL KNEE ARTHROPLASTY;  Surgeon: Ernie Cough, MD;  Location: [facility] ORS;  Service: Orthopedics;  Laterality: Left;   WISDOM TOOTH EXTRACTION     Social History   Tobacco Use   Smoking status: Never    Passive exposure: Current   Smokeless tobacco: Never  Vaping Use   Vaping status: Never Used  Substance Use Topics   Alcohol  use: Not Currently    Alcohol /week: 1.0 standard drink of alcohol     Types: 1 Glasses of wine per week   Drug use: Yes    Types: Marijuana    Comment: 1-2 x per month for help with pain, last 07/07/22   Social History   Social History Narrative   Not on file     Immunization History  Administered Date(s) Administered   PFIZER Comirnaty (Gray Top)Covid-19 Tri-Sucrose Vaccine 01/30/2021   PFIZER(Purple Top)SARS-COV-2 Vaccination 01/02/2020, 01/26/2020, 08/15/2020   Pfizer Covid-19 Vaccine Bivalent Booster 43yrs & up 06/26/2021     Objective: Vital Signs: BP 121/78 (BP Location: Right Arm, Patient Position: Sitting)   Pulse 80   Temp (!) 97.4 F (36.3 C)   Resp 16   Ht 5' 5 (1.651 m)   Wt 210 lb 3.2 oz (95.3 kg)   BMI 34.98 kg/m    Physical Exam Constitutional:      Appearance: She is obese.  HENT:     Mouth/Throat:     Mouth: Mucous membranes are moist.     Pharynx: Oropharynx is clear.  Eyes:     Conjunctiva/sclera: Conjunctivae normal.  Cardiovascular:     Rate and Rhythm: Normal rate and regular rhythm.  Pulmonary:     Effort: Pulmonary effort is normal.     Breath sounds: Normal breath sounds.  Musculoskeletal:     Comments: Trace pedal edema  Lymphadenopathy:     Cervical: No cervical adenopathy.  Skin:    Findings: Rash present.     Comments: Faint erythema in gluteal cleft Nails unable to assess due to acrylics  Neurological:     Mental Status: She is alert.      Musculoskeletal Exam:  Shoulders full ROM no tenderness or swelling Elbows full ROM no tenderness or  swelling Wrists full ROM no tenderness or swelling Fingers full ROM no tenderness or swelling Diffuse paraspinal muscle tnederness, increased tone, pain radiatio up and down from palpation sites Mild scoliosis present Knees full ROM no tenderness or swelling, postsurgical changes Ankles full ROM no tenderness or swelling MTPs full ROM no tenderness or swelling, 1st and 5th MTPs postsurgical changes   Investigation: No additional findings.  Imaging: No results found.  Recent Labs: Lab Results  Component Value Date   WBC 6.7 01/08/2023   HGB 11.6 (L) 01/08/2023   PLT 287 01/08/2023   NA 141 01/08/2023   K 3.4 (L) 01/08/2023   CL 104 01/08/2023   CO2 30 01/08/2023   GLUCOSE 99 01/08/2023   BUN 12 01/08/2023   CREATININE 0.79 01/08/2023   BILITOT 0.5 11/07/2021   ALKPHOS 68 11/07/2021   AST 20 11/07/2021   ALT 15 11/07/2021   PROT 7.2 11/07/2021   ALBUMIN 3.6 11/07/2021   CALCIUM  9.1 01/08/2023   GFRAA >60 04/13/2018    Speciality Comments: No specialty comments available.  Procedures:  No procedures performed Allergies: Albuterol , Penicillins, Voltaren  [diclofenac  sodium], Xarelto [rivaroxaban], Metformin, Hydrocodone -acetaminophen , and Maxalt [rizatriptan]   Assessment / Plan:     Visit Diagnoses: Psoriatic arthritis (HCC) - Plan: Sedimentation rate, C-reactive protein Psoriatic arthritis and plaque psoriasis managed with Rinvoq, effective with no significant inflammatory plaques or swollen joints. Inverse pattern psoriasis present but not severely inflamed. Rinvoq preferred for oral administration and potential sustained benefit. - Check blood tests for inflammatory markers, blood cell counts, kidney and liver function, and cholesterol levels to monitor Rinvoq efficacy and side effects. - Continue Rinvoq 15 mg PO daily for psoriatic arthritis and plaque psoriasis. - Schedule follow-up in three months.  Microcytic anemia  Fibromyalgia - Plan: cyclobenzaprine   (FLEXERIL ) 10 MG tablet Fibromyalgia causes widespread pain, managed with nighttime Flexeril . Associated with memory issues and sleep disturbances.  High risk medication use - Plan: CBC with Differential/Platelet, Comprehensive metabolic panel with GFR, QuantiFERON-TB Gold Plus Reviewed risks of medication including infections, cytopenias, hyperlipidemia, shingles. Particularly consider DVT/VTE risk with long term rinvoq may recommend trial of alternative mechanism, although having great effect and tolerance at present. No Hx of arterial thrombotic event or recurrent DVTs. - Checking CBC and CMP and quantiferon medication monitoring on rinvoq  Osteoarthritis of multiple joints (hips, knees, feet) Osteoarthritis with joint replacements in hips and knees. Swelling and pain in knuckles and shoulders. Chronic since age 6. Managed with Voltaren  and Lidoderm  patches.  Degenerative disc disease of the lumbar spine with chronic back pain Chronic back pain due to degenerative disc disease and scoliosis. Pain radiates along the back. Flexeril  used for muscle relaxation at night. - Prescribe Flexeril  for nighttime use.  Venous insufficiency, lower extremity (suspected) Suspected venous insufficiency indicated by right-sided swelling. Postthrombotic syndrome from history.  Bunion and flat foot (pes planus), mechanical foot pain Mechanical foot pain due to bunions and flat feet, exacerbated by walking patterns. Pain in midfoot area. - Provide printout of ankle and foot stretches.  Anemia with microcytosis (chronic, non-iron deficiency) Chronic anemia with microcytosis, not due to iron deficiency. Iron supplements should be avoided.        Orders: Orders Placed This Encounter  Procedures   Sedimentation rate   C-reactive protein   CBC with Differential/Platelet   Comprehensive metabolic panel with GFR   QuantiFERON-TB Gold Plus   Meds ordered this encounter  Medications   cyclobenzaprine   (FLEXERIL ) 10 MG tablet    Sig: Take 1 tablet (10 mg total) by mouth at bedtime as needed.    Dispense:  90 tablet    Refill:  0    Follow-Up Instructions: Return in about 3 months (around 09/16/2024) for PsA/FMS UPA/flexeril  f/u 3mos.   I personally spent a total of 63 minutes in the care of the patient today including preparing to see the patient, getting/reviewing separately obtained history, performing a medically appropriate  exam/evaluation, counseling and educating, placing orders, referring and communicating with other health care professionals, documenting clinical information in the EHR, and coordinating care.  Lonni LELON Ester, MD  Note - This record has been created using [employer].  Chart creation errors have been sought, but may not always  have been located. Such creation errors do not reflect on  the standard of medical care.

## 2024-06-20 LAB — CBC WITH DIFFERENTIAL/PLATELET
Absolute Lymphocytes: 1915 {cells}/uL (ref 850–3900)
Absolute Monocytes: 372 {cells}/uL (ref 200–950)
Basophils Absolute: 32 {cells}/uL (ref 0–200)
Basophils Relative: 0.5 %
Eosinophils Absolute: 170 {cells}/uL (ref 15–500)
Eosinophils Relative: 2.7 %
HCT: 43.6 % (ref 35.0–45.0)
Hemoglobin: 13.2 g/dL (ref 11.7–15.5)
MCH: 22.5 pg — ABNORMAL LOW (ref 27.0–33.0)
MCHC: 30.3 g/dL — ABNORMAL LOW (ref 32.0–36.0)
MCV: 74.4 fL — ABNORMAL LOW (ref 80.0–100.0)
MPV: 10.7 fL (ref 7.5–12.5)
Monocytes Relative: 5.9 %
Neutro Abs: 3812 {cells}/uL (ref 1500–7800)
Neutrophils Relative %: 60.5 %
Platelets: 293 Thousand/uL (ref 140–400)
RBC: 5.86 Million/uL — ABNORMAL HIGH (ref 3.80–5.10)
RDW: 16.1 % — ABNORMAL HIGH (ref 11.0–15.0)
Total Lymphocyte: 30.4 %
WBC: 6.3 Thousand/uL (ref 3.8–10.8)

## 2024-06-20 LAB — COMPREHENSIVE METABOLIC PANEL WITH GFR
AG Ratio: 1.2 (calc) (ref 1.0–2.5)
ALT: 15 U/L (ref 6–29)
AST: 15 U/L (ref 10–35)
Albumin: 4.4 g/dL (ref 3.6–5.1)
Alkaline phosphatase (APISO): 70 U/L (ref 37–153)
BUN: 12 mg/dL (ref 7–25)
CO2: 28 mmol/L (ref 20–32)
Calcium: 10 mg/dL (ref 8.6–10.4)
Chloride: 103 mmol/L (ref 98–110)
Creat: 0.81 mg/dL (ref 0.50–1.03)
Globulin: 3.8 g/dL — ABNORMAL HIGH (ref 1.9–3.7)
Glucose, Bld: 100 mg/dL — ABNORMAL HIGH (ref 65–99)
Potassium: 4.1 mmol/L (ref 3.5–5.3)
Sodium: 141 mmol/L (ref 135–146)
Total Bilirubin: 0.6 mg/dL (ref 0.2–1.2)
Total Protein: 8.2 g/dL — ABNORMAL HIGH (ref 6.1–8.1)
eGFR: 84 mL/min/1.73m2

## 2024-06-20 LAB — C-REACTIVE PROTEIN: CRP: 13.3 mg/L — ABNORMAL HIGH (ref <8.0)

## 2024-06-20 LAB — SEDIMENTATION RATE: Sed Rate: 33 mm/h — ABNORMAL HIGH (ref 0–30)

## 2024-06-20 LAB — QUANTIFERON-TB GOLD PLUS
Mitogen-NIL: 6.82 [IU]/mL
NIL: 0.06 [IU]/mL
QuantiFERON-TB Gold Plus: NEGATIVE
TB1-NIL: <0 [IU]/mL
TB2-NIL: <0 [IU]/mL

## 2024-07-16 ENCOUNTER — Ambulatory Visit: Admitting: Internal Medicine

## 2024-07-21 ENCOUNTER — Ambulatory Visit: Admitting: Orthopaedic Surgery

## 2024-07-27 ENCOUNTER — Encounter: Payer: Self-pay | Admitting: Radiology

## 2024-07-28 ENCOUNTER — Other Ambulatory Visit (INDEPENDENT_AMBULATORY_CARE_PROVIDER_SITE_OTHER)

## 2024-07-28 ENCOUNTER — Ambulatory Visit (INDEPENDENT_AMBULATORY_CARE_PROVIDER_SITE_OTHER): Admitting: Orthopaedic Surgery

## 2024-07-28 ENCOUNTER — Ambulatory Visit: Admitting: Orthopaedic Surgery

## 2024-07-28 DIAGNOSIS — Z96642 Presence of left artificial hip joint: Secondary | ICD-10-CM

## 2024-07-28 NOTE — Progress Notes (Signed)
 Post-Op Visit Note   Patient: Christine Bradley           Date of Birth: 01-01-1966           MRN: [id number] Visit Date: 07/28/2024 PCP: Rena Luke POUR, MD   Assessment & Plan:  Chief Complaint:  Chief Complaint  Patient presents with   Left Hip - Follow-up    Left total hip arthroplasty 07/25/2023   Visit Diagnoses:  1. Status post total replacement of left hip     Plan: History of Present Illness Christine Bradley is a 58 year old female who presents for a one-year follow-up after left hip replacement.  She is pleased with the outcome of the surgery and is able to perform all desired activities.  Exam of the left hip shows fully healed surgical scar.  Fluid painless range of motion.  Normal gait pattern.  Results RADIOLOGY Left hip x-ray: Prosthesis in good position  Assessment and Plan Status post left total hip arthroplasty One year post-surgery with satisfactory function and no complications. X-rays look great for the implant. She performs activities without limitations. - Continue prophylactic antibiotics before dental procedures for one more year. - Scheduled follow-up visit in one year.  Follow-Up Instructions: Return in about 1 year (around 07/28/2025).   Orders:  Orders Placed This Encounter  Procedures   XR Pelvis 1-2 Views   No orders of the defined types were placed in this encounter.   Imaging: XR Pelvis 1-2 Views Result Date: 07/28/2024 Stable total hip replacement without complications   PMFS History: Patient Active Problem List   Diagnosis Date Noted   Peripheral vascular disease 06/17/2024   Other specified acquired deformities of left lower leg 06/17/2024   Primary localized osteoarthrosis of ankle and foot 06/17/2024   High risk medication use 06/17/2024   GERD (gastroesophageal reflux disease) 04/08/2024   Primary osteoarthritis of left hip 07/25/2023   Status post total replacement of left hip 07/25/2023   Candidal intertrigo  10/31/2022   Hypertension 07/30/2022   S/P total knee arthroplasty, left 07/10/2022   Microcytic anemia 02/20/2022   Acute deep vein thrombosis (DVT) of distal vein of right lower extremity (HCC) 12/08/2021   History of DVT (deep vein thrombosis) 11/21/2021   S/P total knee arthroplasty, right 11/07/2021   S/P total right hip arthroplasty 08/29/2021   Hypopigmentation 11/29/2020   Decreased hearing 07/13/2020   Paresthesia 07/13/2020   Prediabetes 07/12/2020   Psoriatic arthritis (HCC) 06/21/2020   Chronic constipation 05/27/2020   Full thickness rotator cuff tear 03/24/2019   Benign paroxysmal positional vertigo 11/19/2018   Carpal tunnel syndrome of left wrist 11/22/2014   Migraine without status migrainosus, not intractable 11/15/2014   Obesity (BMI 30-39.9) 05/03/2014   Osteopenia 05/09/2012   Asthma 03/21/2012   Fibromyalgia 03/21/2012   GAD (generalized anxiety disorder) 03/21/2012   Chronic interstitial cystitis 01/11/2011   Hyperlipidemia 01/11/2011   Allergic rhinitis 11/17/2010   Past Medical History:  Diagnosis Date   Anxiety    Arthritis    Carpal tunnel syndrome    Chronic bronchitis (HCC)    Chronic kidney disease    patient states she doesn't have any kidney issues.   COPD (chronic obstructive pulmonary disease) (HCC)    DDD (degenerative disc disease), lumbar    Fibromyalgia    GERD (gastroesophageal reflux disease)    H/O blood clots    Headache    Chronic migraines   Hypertension    Hyperthyroidism    IBS (  irritable bowel syndrome)    IC (interstitial cystitis)    Neuromuscular disorder (HCC)    fibromyalgia   Pneumonia 2001   hx of x 2   Pre-diabetes    Psoriatic arthritis (HCC)    Sleep apnea    Vertigo 2022    Family History  Problem Relation Age of Onset   Arthritis Mother    Breast cancer Mother 57   Clotting disorder Mother    Cancer Sister    Clotting disorder Brother    Other Brother        kidney transplant at 65 yrs old    Breast cancer Half-Sister    Allergies Son    ADD / ADHD Son    ADD / ADHD Daughter    Bipolar disorder Daughter    Colon cancer Neg Hx    Esophageal cancer Neg Hx    Liver disease Neg Hx     Past Surgical History:  Procedure Laterality Date   ABDOMINAL HYSTERECTOMY  2004   BREAST EXCISIONAL BIOPSY Left 12/31/2017   foot surgery  1997   HERNIA REPAIR  1996   RADIOACTIVE SEED GUIDED EXCISIONAL BREAST BIOPSY Left 12/31/2017   Procedure: LEFT BREAST SEED GUIDED EXCISIONAL BIOPSY;  Surgeon: Vernetta Berg, MD;  Location: [facility] OR;  Service: General;  Laterality: Left;   right shoulder repair Right    TOTAL HIP ARTHROPLASTY Right 08/29/2021   Procedure: TOTAL HIP ARTHROPLASTY ANTERIOR APPROACH;  Surgeon: Ernie Cough, MD;  Location: [facility];  Service: Orthopedics;  Laterality: Right;   TOTAL HIP ARTHROPLASTY Left 07/25/2023   Procedure: TOTAL HIP ARTHROPLASTY ANTERIOR APPROACH;  Surgeon: Jerri Kay HERO, MD;  Location: [facility] OR;  Service: Orthopedics;  Laterality: Left;  3-C   TOTAL KNEE ARTHROPLASTY Right 11/07/2021   Procedure: TOTAL KNEE ARTHROPLASTY;  Surgeon: Ernie Cough, MD;  Location: [facility];  Service: Orthopedics;  Laterality: Right;   TOTAL KNEE ARTHROPLASTY Left 07/10/2022   Procedure: TOTAL KNEE ARTHROPLASTY;  Surgeon: Ernie Cough, MD;  Location: [facility] OR[facility];  Service: Orthopedics;  Laterality: Left;   WISDOM TOOTH EXTRACTION     Social History   Occupational History   Occupation: disability   Occupation: [employer]  Tobacco Use   Smoking status: Never    Passive exposure: Current   Smokeless tobacco: Never  Vaping Use   Vaping status: Never Used  Substance and Sexual Activity   Alcohol  use: Not Currently    Alcohol /week: 1.0 standard drink of alcohol     Types: 1 Glasses of wine per week   Drug use: Yes    Types: Marijuana    Comment: 1-2 x per month for help with pain, last 07/07/22   Sexual activity: Not on file

## 2024-09-11 ENCOUNTER — Other Ambulatory Visit: Payer: Self-pay | Admitting: Internal Medicine

## 2024-09-11 ENCOUNTER — Ambulatory Visit: Payer: Self-pay | Admitting: Internal Medicine

## 2024-09-11 DIAGNOSIS — M797 Fibromyalgia: Secondary | ICD-10-CM

## 2024-09-11 NOTE — Telephone Encounter (Signed)
 Last Fill: 06/17/2024  Next Visit: 10/30/2024  Last Visit: 06/17/2024  Dx: Fibromyalgia   Current Dose per office note on 06/17/2024: not mentioned   Okay to refill Flexeril ?

## 2024-09-11 NOTE — Progress Notes (Signed)
 Sed rate and CRP mildly elevated at 33 and 13.3. Microcytosis with normal Hgb. No problems for continuing rinvoq as planned.

## 2024-09-14 ENCOUNTER — Ambulatory Visit: Admitting: Internal Medicine

## 2024-09-18 NOTE — Telephone Encounter (Signed)
 Patient called the office stating that she keeps getting calls from speciality pharmacy [facility] for her Rinvoq, she is down to 4 tablets and needs this medication. Please advise.

## 2024-10-21 ENCOUNTER — Telehealth (HOSPITAL_COMMUNITY): Payer: Self-pay | Admitting: Internal Medicine

## 2024-10-21 NOTE — Telephone Encounter (Signed)
 Front office personnel received telephone call from [facility] in [place], [place] about if this pt has been scheduled since they sent a referral from October 2025.   Per [facility] specialty coordinators and nurses (RN's in the [facility]F Clinic) this pt is not appropriate for the chf clinic at [facility]  and just needs general cardiology.   Front office personnel relayed this message to [facility] office staff and they thanked chf clinic front office staff for the information.

## 2024-10-28 NOTE — Progress Notes (Unsigned)
 "  Office Visit Note  Patient: Christine Bradley             Date of Birth: 09-Sep-1966           MRN: [id number]             PCP: Rena Christine POUR, MD Referring: Rena Christine POUR, MD Visit Date: 10/30/2024   Subjective:  No chief complaint on file.   History of Present Illness: Christine Bradley is a 59 y.o. female here for follow up ***   Previous HPI 06/17/24 Christine Bradley is a 59 year old female with psoriatic arthritis and plaque psoriasis who presents for management of her condition.   She has a history of psoriatic arthritis and plaque psoriasis, previously managed at [facility]. She is currently taking Rinvoq 15 mg daily, prescribed by her dermatologist, and has been on it for a couple of months. Her psoriasis initially presented with skin rashes on her scalp, ears, legs, and backside, which were painful and raw. She has tried multiple treatments including Humira, Otezla, and Cosentyx, which provided temporary relief but eventually lost effectiveness. Her psoriasis flares up with seasonal changes, particularly in summer and fall.   She has a history of osteoarthritis, with both hips and knees replaced due to severe damage. She has experienced an 'arthritic body' since age 32, with initial symptoms in her knees. She has undergone various treatments, including injections, but continues to experience pain and swelling, particularly on her right side. She has been diagnosed with arthritis in her feet and has swelling, particularly on her right side. She experiences pain in her knuckles and hands, which has worsened over the last six months, and also reports shoulder pain.   She has fibromyalgia, diagnosed at age 62, which contributes to her chronic pain. She experiences constipation since childhood, managed with medication, and has a history of acid reflux. She wakes up twice nightly due to bladder urgency. She takes Flexeril  at night for muscle relaxation and Qulipta for  migraines, which have been effective. She experiences short-term memory issues, which are new for her.   She has a history of scoliosis, diagnosed in adulthood, and degenerative disc disease, identified last year through imaging. She uses Voltaren  and Lidoderm  patches for joint pain. She reports numbness in her feet and legs, but not in her hands. She has not had recent imaging of her back and has self-administered arthritis medications such as Humira.   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       DMARD Hx PsA Humira+otezla Cosentyx rinvoq (current)   FMS  Cymbalta Effexor  Lyrica  Flexeril  baclofen   Labs reviewed 06/2023 CBC - MCV 73 CMP wnl Iron wnl     No Rheumatology ROS completed.   PMFS History:  Patient Active Problem List   Diagnosis Date Noted   Peripheral vascular disease 06/17/2024   Other specified acquired deformities of left lower leg 06/17/2024   Primary localized osteoarthrosis of ankle and foot 06/17/2024   High risk medication use 06/17/2024   GERD (gastroesophageal reflux disease) 04/08/2024   Primary osteoarthritis of left hip 07/25/2023   Status post total replacement of left hip 07/25/2023   Candidal intertrigo 10/31/2022   Hypertension 07/30/2022   S/P total knee arthroplasty, left 07/10/2022   Microcytic anemia 02/20/2022   Acute deep vein thrombosis (DVT) of distal vein of right lower extremity (HCC) 12/08/2021  History of DVT (deep vein thrombosis) 11/21/2021   S/P total knee arthroplasty, right 11/07/2021   S/P total right hip arthroplasty 08/29/2021   Hypopigmentation 11/29/2020   Decreased hearing 07/13/2020   Paresthesia 07/13/2020   Prediabetes 07/12/2020   Psoriatic arthritis (HCC) 06/21/2020   Chronic constipation 05/27/2020   Full thickness rotator cuff tear 03/24/2019   Benign paroxysmal positional vertigo 11/19/2018   Carpal  tunnel syndrome of left wrist 11/22/2014   Migraine without status migrainosus, not intractable 11/15/2014   Obesity (BMI 30-39.9) 05/03/2014   Osteopenia 05/09/2012   Asthma 03/21/2012   Fibromyalgia 03/21/2012   GAD (generalized anxiety disorder) 03/21/2012   Chronic interstitial cystitis 01/11/2011   Hyperlipidemia 01/11/2011   Allergic rhinitis 11/17/2010    Past Medical History:  Diagnosis Date   Anxiety    Arthritis    Carpal tunnel syndrome    Chronic bronchitis (HCC)    Chronic kidney disease    patient states she doesn't have any kidney issues.   COPD (chronic obstructive pulmonary disease) (HCC)    DDD (degenerative disc disease), lumbar    Fibromyalgia    GERD (gastroesophageal reflux disease)    H/O blood clots    Headache    Chronic migraines   Hypertension    Hyperthyroidism    IBS (irritable bowel syndrome)    IC (interstitial cystitis)    Neuromuscular disorder (HCC)    fibromyalgia   Pneumonia 2001   hx of x 2   Pre-diabetes    Psoriatic arthritis (HCC)    Sleep apnea    Vertigo 2022    Family History  Problem Relation Age of Onset   Arthritis Mother    Breast cancer Mother 63   Clotting disorder Mother    Cancer Sister    Clotting disorder Brother    Other Brother        kidney transplant at 1 yrs old   Breast cancer Half-Sister    Allergies Son    ADD / ADHD Son    ADD / ADHD Daughter    Bipolar disorder Daughter    Colon cancer Neg Hx    Esophageal cancer Neg Hx    Liver disease Neg Hx    Past Surgical History:  Procedure Laterality Date   ABDOMINAL HYSTERECTOMY  2004   BREAST EXCISIONAL BIOPSY Left 12/31/2017   foot surgery  1997   HERNIA REPAIR  1996   RADIOACTIVE SEED GUIDED EXCISIONAL BREAST BIOPSY Left 12/31/2017   Procedure: LEFT BREAST SEED GUIDED EXCISIONAL BIOPSY;  Surgeon: Vernetta Berg, MD;  Location: [facility] OR;  Service: General;  Laterality: Left;   right shoulder repair Right    TOTAL HIP ARTHROPLASTY Right 08/29/2021    Procedure: TOTAL HIP ARTHROPLASTY ANTERIOR APPROACH;  Surgeon: Ernie Cough, MD;  Location: [facility];  Service: Orthopedics;  Laterality: Right;   TOTAL HIP ARTHROPLASTY Left 07/25/2023   Procedure: TOTAL HIP ARTHROPLASTY ANTERIOR APPROACH;  Surgeon: Jerri Kay HERO, MD;  Location: [facility] OR;  Service: Orthopedics;  Laterality: Left;  3-C   TOTAL KNEE ARTHROPLASTY Right 11/07/2021   Procedure: TOTAL KNEE ARTHROPLASTY;  Surgeon: Ernie Cough, MD;  Location: [facility];  Service: Orthopedics;  Laterality: Right;   TOTAL KNEE ARTHROPLASTY Left 07/10/2022   Procedure: TOTAL KNEE ARTHROPLASTY;  Surgeon: Ernie Cough, MD;  Location: [facility];  Service: Orthopedics;  Laterality: Left;   WISDOM TOOTH EXTRACTION     Social History   Social History Narrative   Not on file   Immunization History  Administered Date(s) Administered   PFIZER Comirnaty (Gray Top)Covid-19 Tri-Sucrose Vaccine 01/30/2021   PFIZER(Purple Top)SARS-COV-2 Vaccination 01/02/2020, 01/26/2020, 08/15/2020   Pfizer Covid-19 Vaccine Bivalent Booster 47yrs & up 06/26/2021     Objective: Vital Signs: There were no vitals taken for this visit.   Physical Exam   Musculoskeletal Exam: ***   Investigation: No additional findings.  Imaging: No results found.  Recent Labs: Lab Results  Component Value Date   WBC 6.3 06/17/2024   HGB 13.2 06/17/2024   PLT 293 06/17/2024   NA 141 06/17/2024   K 4.1 06/17/2024   CL 103 06/17/2024   CO2 28 06/17/2024   GLUCOSE 100 (H) 06/17/2024   BUN 12 06/17/2024   CREATININE 0.81 06/17/2024   BILITOT 0.6 06/17/2024   ALKPHOS 68 11/07/2021   AST 15 06/17/2024   ALT 15 06/17/2024   PROT 8.2 (H) 06/17/2024   ALBUMIN 3.6 11/07/2021   CALCIUM  10.0 06/17/2024   GFRAA >60 04/13/2018   QFTBGOLDPLUS NEGATIVE 06/17/2024    Speciality Comments: No specialty comments available.  Procedures:  No procedures performed Allergies: Albuterol , Penicillins, Voltaren  [diclofenac  sodium], Xarelto  [rivaroxaban], Metformin, Hydrocodone -acetaminophen , and Maxalt [rizatriptan]   Assessment / Plan:     Visit Diagnoses:  Assessment & Plan   ***  Follow-Up Instructions: No follow-ups on file.   Lonni LELON Ester, MD  Note - This record has been created using [employer].  Chart creation errors have been sought, but may not always  have been located. Such creation errors do not reflect on  the standard of medical care. "

## 2024-10-30 ENCOUNTER — Ambulatory Visit: Admitting: Internal Medicine

## 2024-11-09 ENCOUNTER — Ambulatory Visit: Admitting: Internal Medicine

## 2025-07-28 ENCOUNTER — Ambulatory Visit: Admitting: Orthopaedic Surgery
